# Patient Record
Sex: Female | Born: 1960
Health system: Southern US, Community
[De-identification: ages and names within clinical notes are randomized; demographics above are authoritative.]

## PROBLEM LIST (undated history)

## (undated) DIAGNOSIS — K649 Unspecified hemorrhoids: Secondary | ICD-10-CM

## (undated) DIAGNOSIS — M1990 Unspecified osteoarthritis, unspecified site: Secondary | ICD-10-CM

## (undated) DIAGNOSIS — R911 Solitary pulmonary nodule: Secondary | ICD-10-CM

## (undated) DIAGNOSIS — T7840XA Allergy, unspecified, initial encounter: Secondary | ICD-10-CM

## (undated) DIAGNOSIS — F32A Depression, unspecified: Secondary | ICD-10-CM

## (undated) DIAGNOSIS — L821 Other seborrheic keratosis: Secondary | ICD-10-CM

## (undated) DIAGNOSIS — J45909 Unspecified asthma, uncomplicated: Secondary | ICD-10-CM

## (undated) DIAGNOSIS — K219 Gastro-esophageal reflux disease without esophagitis: Secondary | ICD-10-CM

## (undated) DIAGNOSIS — E7800 Pure hypercholesterolemia, unspecified: Secondary | ICD-10-CM

## (undated) DIAGNOSIS — S060X0A Concussion without loss of consciousness, initial encounter: Secondary | ICD-10-CM

## (undated) DIAGNOSIS — F419 Anxiety disorder, unspecified: Secondary | ICD-10-CM

## (undated) HISTORY — PX: DILATION AND CURETTAGE OF UTERUS: SHX78

## (undated) HISTORY — DX: Gastro-esophageal reflux disease without esophagitis: K21.9

## (undated) HISTORY — PX: SALPINGECTOMY: SHX328

## (undated) HISTORY — DX: Allergy, unspecified, initial encounter: T78.40XA

## (undated) HISTORY — PX: ECTOPIC PREGNANCY SURGERY: SHX613

## (undated) HISTORY — DX: Other seborrheic keratosis: L82.1

## (undated) HISTORY — DX: Pure hypercholesterolemia, unspecified: E78.00

## (undated) HISTORY — DX: Unspecified hemorrhoids: K64.9

## (undated) HISTORY — DX: Solitary pulmonary nodule: R91.1

## (undated) HISTORY — DX: Anxiety disorder, unspecified: F41.9

## (undated) HISTORY — DX: Unspecified asthma, uncomplicated: J45.909

## (undated) HISTORY — PX: COSMETIC SURGERY: SHX468

## (undated) HISTORY — PX: CHOLECYSTECTOMY: SHX55

## (undated) HISTORY — DX: Concussion without loss of consciousness, initial encounter: S06.0X0A

## (undated) HISTORY — DX: Depression, unspecified: F32.A

---
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
## 1981-08-02 HISTORY — PX: WRIST SURGERY: SHX841

## 1986-08-02 HISTORY — PX: ABDOMINAL HYSTERECTOMY: SHX81

## 1988-08-02 HISTORY — PX: RHINOPLASTY: SUR1284

## 1999-06-15 ENCOUNTER — Other Ambulatory Visit: Admission: RE | Admit: 1999-06-15 | Discharge: 1999-06-15 | Payer: Self-pay | Admitting: Gynecology

## 2005-08-02 LAB — HM PAP SMEAR: HM Pap smear: NORMAL

## 2006-01-18 ENCOUNTER — Ambulatory Visit: Payer: Self-pay | Admitting: Family Medicine

## 2006-03-10 ENCOUNTER — Ambulatory Visit: Payer: Self-pay | Admitting: Family Medicine

## 2006-03-14 ENCOUNTER — Ambulatory Visit: Payer: Self-pay | Admitting: Family Medicine

## 2007-06-08 ENCOUNTER — Ambulatory Visit: Payer: Self-pay | Admitting: Family Medicine

## 2007-07-21 ENCOUNTER — Ambulatory Visit: Payer: Self-pay | Admitting: Unknown Physician Specialty

## 2009-11-11 ENCOUNTER — Ambulatory Visit: Payer: Self-pay | Admitting: Family Medicine

## 2010-06-10 ENCOUNTER — Emergency Department: Payer: Self-pay | Admitting: Emergency Medicine

## 2010-06-23 ENCOUNTER — Ambulatory Visit: Payer: Self-pay | Admitting: Specialist

## 2010-09-04 ENCOUNTER — Ambulatory Visit: Payer: Self-pay | Admitting: Surgery

## 2010-09-09 LAB — PATHOLOGY REPORT

## 2010-12-22 ENCOUNTER — Ambulatory Visit: Payer: Self-pay | Admitting: Specialist

## 2011-02-23 ENCOUNTER — Ambulatory Visit: Payer: Self-pay | Admitting: Family Medicine

## 2011-06-16 ENCOUNTER — Ambulatory Visit: Payer: Self-pay | Admitting: Specialist

## 2012-01-03 ENCOUNTER — Emergency Department: Payer: Self-pay | Admitting: Unknown Physician Specialty

## 2012-02-24 ENCOUNTER — Ambulatory Visit: Payer: Self-pay | Admitting: Family Medicine

## 2012-06-15 ENCOUNTER — Ambulatory Visit: Payer: Self-pay | Admitting: Specialist

## 2012-12-18 ENCOUNTER — Ambulatory Visit: Payer: Self-pay | Admitting: Specialist

## 2013-01-18 DIAGNOSIS — Z87820 Personal history of traumatic brain injury: Secondary | ICD-10-CM | POA: Insufficient documentation

## 2013-02-27 ENCOUNTER — Encounter: Payer: Self-pay | Admitting: Neurology

## 2013-02-28 ENCOUNTER — Encounter: Payer: Self-pay | Admitting: Neurology

## 2013-02-28 ENCOUNTER — Ambulatory Visit (INDEPENDENT_AMBULATORY_CARE_PROVIDER_SITE_OTHER): Payer: Federal, State, Local not specified - PPO | Admitting: Neurology

## 2013-02-28 VITALS — BP 100/64 | HR 57 | Ht 62.5 in | Wt 144.0 lb

## 2013-02-28 DIAGNOSIS — S060X0A Concussion without loss of consciousness, initial encounter: Secondary | ICD-10-CM

## 2013-02-28 HISTORY — DX: Concussion without loss of consciousness, initial encounter: S06.0X0A

## 2013-02-28 NOTE — Progress Notes (Signed)
Reason for visit: Concussion  Sara Randolph is a 52 y.o. female  History of present illness:  Sara Randolph is a 52 year old right-handed white female with a history of a fall that occurred on 01/18/2013. The patient was in [place] at that time, and she went into the bathroom, and slipped on tile. The patient struck her face, without definite loss of consciousness. Her husband was present at the time, and he got to her within seconds after the fall, and the patient was conscious, talking to him. The patient however had a period of amnesia that precedes the fall by several minutes, and follows the fall by 6-8 hours. The patient went to the emergency room, and she underwent a CT scan of the brain that was unremarkable. The patient did damage some of her front teeth. The patient did have a headache for several days afterwards, and she had some neck stiffness. The patient however, has had a full recovery. The patient denies any residual confusion, memory problems, headache, neck stiffness, gait disorder, or numbness or weakness of the face, arms, or legs. The patient denies problems controlling the bowels or the bladder. The patient has not had any further blackout episodes or episodes of "lost time". The patient did have an event where she went to a doctor's office on the wrong day for an appointment. This has not recurred. The patient is back to functioning fully at this time, driving a car, paying her bills, cooking, etc. without problems. The patient returns to this office for an evaluation.  Past Medical History  Diagnosis Date  . Asthma   . Hemorrhoids   . High cholesterol   . Concussion with no loss of consciousness 02/28/2013    Past Surgical History  Procedure Laterality Date  . Tonsillectomy  1969  . Wrist surgery  1983  . Abdominal hysterectomy  1988  . Rhinoplasty  1990  . Cholecystectomy    . Ectopic pregnancy surgery      Family History  Problem Relation Age of Onset  .  Alzheimer's disease Mother   . Parkinsonism Father   . Heart disease Brother     Social history:  reports that she has never smoked. She does not have any smokeless tobacco history on file. She reports that  drinks alcohol. She reports that she does not use illicit drugs.  Medications:  Current Outpatient Prescriptions on File Prior to Visit  Medication Sig Dispense Refill  . cetirizine (ZYRTEC) 10 MG tablet Take 10 mg by mouth daily.      . valACYclovir (VALTREX) 1000 MG tablet Take 1,000 mg by mouth as needed.       No current facility-administered medications on file prior to visit.    Allergies:  Allergies  Allergen Reactions  . Codeine   . Hydrocodone Nausea Only    ROS:  Out of a complete 14 system review of symptoms, the patient complains only of the following symptoms, and all other reviewed systems are negative.  Transient headache, confusion following fall  Amnesia for the fall event  Blood pressure 100/64, pulse 57, height 5' 2.5" (1.588 m), weight 144 lb (65.318 kg).  Physical Exam  General: The patient is alert and cooperative at the time of the examination.  Head: Pupils are equal, round, and reactive to light. Discs are flat bilaterally.  Neck: The neck is supple, no carotid bruits are noted.  Respiratory: The respiratory examination is clear.  Cardiovascular: The cardiovascular examination reveals a regular rate and  rhythm, no obvious murmurs or rubs are noted.  Skin: Extremities are without significant edema.  Neurologic Exam  Mental status:  Cranial nerves: Facial symmetry is present. There is good sensation of the face to pinprick and soft touch bilaterally. The strength of the facial muscles and the muscles to head turning and shoulder shrug are normal bilaterally. Speech is well enunciated, no aphasia or dysarthria is noted. Extraocular movements are full. Visual fields are full.  Motor: The motor testing reveals 5 over 5 strength of all 4  extremities. Good symmetric motor tone is noted throughout.  Sensory: Sensory testing is intact to pinprick, soft touch, vibration sensation, and position sense on all 4 extremities. No evidence of extinction is noted.  Coordination: Cerebellar testing reveals good finger-nose-finger and heel-to-shin bilaterally.  Gait and station: Gait is normal. Tandem gait is normal. Romberg is negative. No drift is seen.  Reflexes: Deep tendon reflexes are symmetric and normal bilaterally. Toes are downgoing bilaterally.   Assessment/Plan:  1. Concussion  The patient sustained a concussion, but she has recovered quite rapidly. The clinical examination today is normal. The patient did not lose consciousness, and she did not have a seizure around the time of the concussion. The patient does have antegrade amnesia and some period of amnesia following the fall. The patient has no residual symptoms. I would not restrict any of her activities at this point. No further workup is indicated, but if the patient indicates that she has had a change in her clinical condition, she is to call our office for an evaluation. The patient will followup if needed.  Marlan Palau MD 02/28/2013 9:11 AM  [facility] [address] [place], [place] [address]  Phone [phone] Fax [phone]

## 2013-06-04 ENCOUNTER — Ambulatory Visit: Payer: Self-pay | Admitting: Family Medicine

## 2013-10-05 ENCOUNTER — Ambulatory Visit: Payer: Self-pay | Admitting: Family Medicine

## 2013-10-08 ENCOUNTER — Ambulatory Visit: Payer: Self-pay | Admitting: Unknown Physician Specialty

## 2013-10-08 LAB — HM COLONOSCOPY: HM Colonoscopy: NORMAL

## 2014-01-07 ENCOUNTER — Ambulatory Visit: Payer: Self-pay | Admitting: Specialist

## 2014-06-17 ENCOUNTER — Ambulatory Visit: Payer: Self-pay | Admitting: Family Medicine

## 2014-06-18 LAB — HM MAMMOGRAPHY: HM Mammogram: NORMAL

## 2014-10-03 ENCOUNTER — Ambulatory Visit: Payer: Self-pay | Admitting: Family Medicine

## 2015-01-13 ENCOUNTER — Encounter: Payer: Self-pay | Admitting: Family Medicine

## 2015-01-13 ENCOUNTER — Ambulatory Visit (INDEPENDENT_AMBULATORY_CARE_PROVIDER_SITE_OTHER): Payer: Federal, State, Local not specified - PPO | Admitting: Family Medicine

## 2015-01-13 ENCOUNTER — Encounter (INDEPENDENT_AMBULATORY_CARE_PROVIDER_SITE_OTHER): Payer: Self-pay

## 2015-01-13 VITALS — BP 124/84 | HR 77 | Temp 98.2°F | Resp 14 | Ht 62.0 in | Wt 158.2 lb

## 2015-01-13 DIAGNOSIS — K649 Unspecified hemorrhoids: Secondary | ICD-10-CM | POA: Insufficient documentation

## 2015-01-13 DIAGNOSIS — J3089 Other allergic rhinitis: Secondary | ICD-10-CM | POA: Insufficient documentation

## 2015-01-13 DIAGNOSIS — Z79899 Other long term (current) drug therapy: Secondary | ICD-10-CM | POA: Diagnosis not present

## 2015-01-13 DIAGNOSIS — J309 Allergic rhinitis, unspecified: Secondary | ICD-10-CM | POA: Diagnosis not present

## 2015-01-13 DIAGNOSIS — N951 Menopausal and female climacteric states: Secondary | ICD-10-CM

## 2015-01-13 DIAGNOSIS — B001 Herpesviral vesicular dermatitis: Secondary | ICD-10-CM | POA: Insufficient documentation

## 2015-01-13 DIAGNOSIS — H81399 Other peripheral vertigo, unspecified ear: Secondary | ICD-10-CM | POA: Insufficient documentation

## 2015-01-13 DIAGNOSIS — K219 Gastro-esophageal reflux disease without esophagitis: Secondary | ICD-10-CM | POA: Diagnosis not present

## 2015-01-13 DIAGNOSIS — E785 Hyperlipidemia, unspecified: Secondary | ICD-10-CM | POA: Diagnosis not present

## 2015-01-13 MED ORDER — MOMETASONE FUROATE 50 MCG/ACT NA SUSP: 2.0000 | Freq: Every day | NASAL | Status: DC

## 2015-01-13 MED ORDER — ATORVASTATIN CALCIUM 20 MG PO TABS: 20.0000 mg | ORAL_TABLET | Freq: Every day | ORAL | Status: DC

## 2015-01-13 MED ORDER — OMEPRAZOLE 20 MG PO CPDR: 20.0000 mg | DELAYED_RELEASE_CAPSULE | Freq: Every day | ORAL | Status: DC

## 2015-01-13 MED ORDER — VALACYCLOVIR HCL 1 G PO TABS: 1000.0000 mg | ORAL_TABLET | ORAL | Status: DC | PRN

## 2015-01-13 MED ORDER — VENLAFAXINE HCL 75 MG PO TABS: 75.0000 mg | ORAL_TABLET | Freq: Every day | ORAL | Status: DC

## 2015-01-13 NOTE — Progress Notes (Signed)
Name: Sara Randolph   MRN: [id number]    DOB: Sep 27, 1960   Date:01/13/2015       Progress Note  Subjective  Chief Complaint  Chief Complaint  Patient presents with  . Medication Refill  . Allergic Rhinitis   . Gastrophageal Reflux  . Hyperlipidemia    HPI  MENOPAUSAL SYMPTOMS:  She only has hot flashes, but improved with Effexor, only triggered by alcohol intake or when she eats more sweets.  No side effects of medication.   GERD: usually under control, but she has changed her diet recently, increasing fruit and vegetables, and she  noticed a little increase of symptoms, but is back on daily  Omeprazole and has not symptoms now.  HYPERLIPIDEMIA: taking Crestor, could not tolerate pravastatin because it caused pruritus.  She states Crestor is too expensive, we will try Atorvastatin now  AR: she would like to change from Flonase to Nasacort because of nose bleeds and headaches, however not covered under her plan, so we will change to Nasonex and she will let us know if it works. Otherwise are controlled with Zyrtec and Nasal steroid daily    Patient Active Problem List   Diagnosis Date Noted  . Peripheral vertigo 01/13/2015  . Dyslipidemia 01/13/2015  . Cold sore 01/13/2015  . Gastric reflux 01/13/2015  . Allergic rhinitis 01/13/2015  . History of concussion 01/18/2013    Past Surgical History  Procedure Laterality Date  . Tonsillectomy  1969  . Wrist surgery  1983  . Abdominal hysterectomy  1988  . Rhinoplasty  1990  . Cholecystectomy    . Ectopic pregnancy surgery    . Dilation and curettage of uterus    . Salpingectomy      Family History  Problem Relation Age of Onset  . Alzheimer's disease Mother   . Parkinsonism Father   . Heart disease Brother   . Heart attack Brother   . Depression Sister   . Osteoporosis Sister     History   Social History  . Marital Status: Married    Spouse Name: Buddy  . Number of Children: 2  . Years of Education: N/A    Occupational History  . Postmaster    Social History Main Topics  . Smoking status: Never Smoker   . Smokeless tobacco: Never Used  . Alcohol Use: 0.0 oz/week    0 Standard drinks or equivalent per week     Comment: Consumes alcohol on Saturday  . Drug Use: No  . Sexual Activity: Yes   Other Topics Concern  . Not on file   Social History Narrative     Current outpatient prescriptions:  .  acetaminophen (TYLENOL) 500 MG tablet, Take 1 tablet by mouth daily., Disp: , Rfl:  .  aspirin 81 MG tablet, Take 1 tablet by mouth daily., Disp: , Rfl:  .  cetirizine (ZYRTEC) 10 MG tablet, Take 10 mg by mouth daily., Disp: , Rfl:  .  Coenzyme Q-10 100 MG capsule, Take 1 capsule by mouth daily., Disp: , Rfl:  .  fluticasone (FLONASE) 50 MCG/ACT nasal spray, Place 2 sprays into both nostrils daily., Disp: , Rfl:  .  omeprazole (PRILOSEC) 20 MG capsule, Take 1 capsule by mouth daily., Disp: , Rfl:  .  rosuvastatin (CRESTOR) 5 MG tablet, Take 1 tablet by mouth daily., Disp: , Rfl:  .  valACYclovir (VALTREX) 1000 MG tablet, Take 1,000 mg by mouth as needed., Disp: , Rfl:  .  venlafaxine (EFFEXOR) 75 MG  tablet, Take 1 tablet by mouth daily., Disp: , Rfl:   Allergies  Allergen Reactions  . Pravastatin Itching    dizziness  . Codeine   . Hydrocodone Nausea Only     ROS  Constitutional: Negative for fever or weight change.  Respiratory: Negative for cough and shortness of breath.   Cardiovascular: Negative for chest pain or palpitations.  Gastrointestinal: Negative for abdominal pain, no bowel changes.  Musculoskeletal: Negative for gait problem or joint swelling.  Skin: Negative for rash.  Neurological: Negative for dizziness. Some headaches No other specific complaints in a complete review of systems (except as listed in HPI above).  Objective  Filed Vitals:   01/13/15 1644  BP: 124/84  Pulse: 77  Temp: 98.2 F (36.8 C)  TempSrc: Oral  Resp: 14  Height: 5\' 2"  (1.575 m)   Weight: 158 lb 3.2 oz (71.759 kg)  SpO2: 97%    Body mass index is 28.93 kg/(m^2).  Physical Exam  Constitutional: Patient appears well-developed and well-nourished. No distress.  HENT: Head: Normocephalic and atraumatic. Ears: B TMs ok, no erythema or effusion; Nose: Nose normal. Mouth/Throat: Oropharynx is clear and moist. No oropharyngeal exudate.  Eyes: Conjunctivae and EOM are normal. Pupils are equal, round, and reactive to light. No scleral icterus.  Neck: Normal range of motion. Neck supple. No JVD present. No thyromegaly present.  Cardiovascular: Normal rate, regular rhythm and normal heart sounds.  No murmur heard. No BLE edema. Pulmonary/Chest: Effort normal and breath sounds normal. No respiratory distress. Abdominal: Soft. Bowel sounds are normal, no distension. There is no tenderness. no masses Musculoskeletal: Normal range of motion, no joint effusions. No gross deformities Neurological: he is alert and oriented to person, place, and time. No cranial nerve deficit. Coordination, balance, strength, speech and gait are normal.  Skin: Skin is warm and dry. No rash noted. No erythema.  Psychiatric: Patient has a normal mood and affect. behavior is normal. Judgment and thought content normal.      PHQ2/9: Depression screen PHQ 2/9 01/13/2015  Decreased Interest 0  Down, Depressed, Hopeless 0  PHQ - 2 Score 0    Fall Risk: Fall Risk  01/13/2015  Falls in the past year? No     Assessment & Plan   1. Menopausal symptom  - venlafaxine (EFFEXOR) 75 MG tablet; Take 1 tablet (75 mg total) by mouth daily.  Dispense: 90 tablet; Refill: 1  2. Gastric reflux  - omeprazole (PRILOSEC) 20 MG capsule; Take 1 capsule (20 mg total) by mouth daily.  Dispense: 90 capsule; Refill: 1  3. Dyslipidemia  - Lipid Profile - atorvastatin (LIPITOR) 20 MG tablet; Take 1 tablet (20 mg total) by mouth daily.  Dispense: 90 tablet; Refill: 1  4. Perennial allergic rhinitis  -  Comprehensive Metabolic Panel (CMET) - mometasone (NASONEX) 50 MCG/ACT nasal spray; Place 2 sprays into the nose daily.  Dispense: 17 g; Refill: 5  5. Long-term use of high-risk medication Check labs

## 2015-04-02 ENCOUNTER — Other Ambulatory Visit: Payer: Self-pay | Admitting: Family Medicine

## 2015-04-02 DIAGNOSIS — R739 Hyperglycemia, unspecified: Secondary | ICD-10-CM

## 2015-04-02 LAB — LIPID PANEL
Chol/HDL Ratio: 3.1 ratio units (ref 0.0–4.4)
Cholesterol, Total: 171 mg/dL (ref 100–199)
HDL: 56 mg/dL (ref >39)
LDL Calculated: 92 mg/dL (ref 0–99)
Triglycerides: 117 mg/dL (ref 0–149)
VLDL Cholesterol Cal: 23 mg/dL (ref 5–40)

## 2015-04-02 LAB — COMPREHENSIVE METABOLIC PANEL
ALT: 11 IU/L (ref 0–32)
AST: 12 IU/L (ref 0–40)
Albumin/Globulin Ratio: 1.8 (ref 1.1–2.5)
Albumin: 4.5 g/dL (ref 3.5–5.5)
Alkaline Phosphatase: 64 IU/L (ref 39–117)
BUN/Creatinine Ratio: 28 — ABNORMAL HIGH (ref 9–23)
BUN: 19 mg/dL (ref 6–24)
Bilirubin Total: 0.5 mg/dL (ref 0.0–1.2)
CO2: 24 mmol/L (ref 18–29)
Calcium: 9.6 mg/dL (ref 8.7–10.2)
Chloride: 98 mmol/L (ref 97–108)
Creatinine, Ser: 0.69 mg/dL (ref 0.57–1.00)
GFR calc Af Amer: 113 mL/min/{1.73_m2} (ref >59)
GFR calc non Af Amer: 98 mL/min/{1.73_m2} (ref >59)
Globulin, Total: 2.5 g/dL (ref 1.5–4.5)
Glucose: 100 mg/dL — ABNORMAL HIGH (ref 65–99)
Potassium: 4.8 mmol/L (ref 3.5–5.2)
Sodium: 139 mmol/L (ref 134–144)
Total Protein: 7 g/dL (ref 6.0–8.5)

## 2015-04-02 NOTE — Progress Notes (Signed)
Pt notified A1C added

## 2015-05-11 ENCOUNTER — Other Ambulatory Visit: Payer: Self-pay | Admitting: Family Medicine

## 2015-06-29 ENCOUNTER — Telehealth: Payer: Self-pay | Admitting: Physician Assistant

## 2015-06-29 DIAGNOSIS — B001 Herpesviral vesicular dermatitis: Secondary | ICD-10-CM

## 2015-06-29 MED ORDER — ACYCLOVIR 400 MG PO TABS: 800.0000 mg | ORAL_TABLET | Freq: Two times a day (BID) | ORAL | Status: DC

## 2015-06-29 NOTE — Progress Notes (Signed)
We are sorry that you are not feeling well.  Here is how we plan to help!  Based on what you have shared with me it does look like you have a viral infection.    Most cold sores or fever blisters are small fluid filled blisters around the mouth caused by herpes simplex virus.  The most common strain of the virus causing cold sores is herpes simplex virus 1.  It can be spread by skin contact, sharing eating utensils, or even sharing towels.  Cold sores are contagious to other people until dry. (Approximately 5-7 days).  Wash your hands. You can spread the virus to your eyes through handling your contact lenses after touching the lesions.  Most people experience pain at the sight or tingling sensations in their lips that may begin before the ulcers erupt.  Herpes simplex is treatable but not curable.  It may lie dormant for a long time and then reappear due to stress or prolonged sun exposure.  Many patients have success in treating their cold sores with an over the counter topical called Abreva.  You may apply the cream up to 5 times daily (maximum 10 days) until healing occurs.  If you would like to use an oral antiviral medication to speed the healing of your cold sore, I have sent a prescription to your local pharmacy Acyclovir 800 mg twice a day for 7 days. For sinus symptoms, increase fluids. Take Mucinex (plain if history of high blood pressure), Mucinex-DM if there is a significant cough. Place a humidifier in the bedroom. If symptoms are not resolving over the next 3-4 days, let us know.  HOME CARE:   Wash your hands frequently.  Do not pick at or rub the sore.  Don't open the blisters.  Avoid kissing other people during this time.  Avoid sharing drinking glasses, eating utensils, or razors.  Do not handle contact lenses unless you have thoroughly washed your hands with soap and warm water!  Avoid oral sex during this time.  Herpes from sores on your mouth can spread to your  partner's genital area.  Avoid contact with anyone who has eczema or a weakened immune system.  Cold sores are often triggered by exposure to intense sunlight, use a lip balm containing a sunscreen (SPF 30 or higher).  GET HELP RIGHT AWAY IF:   Blisters look infected.  Blisters occur near or in the eye.  Symptoms last longer than 10 days.  Your symptoms become worse.  MAKE SURE YOU:   Understand these instructions.  Will watch your condition.  Will get help right away if you are not doing well or get worse.    Your e-visit answers were reviewed by a board certified advanced clinical practitioner to complete your personal care plan.  Depending upon the condition, your plan could have  Included both over the counter or prescription medications.    Please review your pharmacy choice.  Be sure that the pharmacy you have chosen is open so that you can pick up your prescription now.  If there is a problem you csn message your provider in [place] to have the prescription routed to another pharmacy.    Your safety is important to [place].  If you have drug allergies check our prescription carefully.  For the next 24 hours you can use MyChart to ask questions about today's visit, request a non-urgent call back, or ask for a work or school excuse from your e-visit provider.  You will  get an email in the next two days asking about your experience.  I hope that your e-visit has been valuable and will speed your recovery.

## 2015-07-14 ENCOUNTER — Telehealth: Payer: Self-pay | Admitting: Family Medicine

## 2015-07-14 NOTE — Telephone Encounter (Signed)
Patient next appointment is 08-07-15 for CPE. Patient has lost her venlafaxine, it was in her purse and when she dropped her purse everything fell out and she has not seen it since. She is requesting a refill today to be sent to [facility].

## 2015-07-15 ENCOUNTER — Other Ambulatory Visit: Payer: Self-pay | Admitting: Family Medicine

## 2015-07-15 MED ORDER — VENLAFAXINE HCL ER 75 MG PO CP24: 75.0000 mg | ORAL_CAPSULE | Freq: Every day | ORAL | Status: DC

## 2015-07-15 NOTE — Telephone Encounter (Signed)
done

## 2015-08-07 ENCOUNTER — Ambulatory Visit (INDEPENDENT_AMBULATORY_CARE_PROVIDER_SITE_OTHER): Payer: Federal, State, Local not specified - PPO | Admitting: Family Medicine

## 2015-08-07 ENCOUNTER — Encounter: Payer: Self-pay | Admitting: Family Medicine

## 2015-08-07 VITALS — BP 108/70 | HR 96 | Temp 98.1°F | Resp 16 | Ht 62.0 in | Wt 159.2 lb

## 2015-08-07 DIAGNOSIS — R739 Hyperglycemia, unspecified: Secondary | ICD-10-CM

## 2015-08-07 DIAGNOSIS — Z1239 Encounter for other screening for malignant neoplasm of breast: Secondary | ICD-10-CM

## 2015-08-07 DIAGNOSIS — Z0000 Encounter for general adult medical examination without abnormal findings: Secondary | ICD-10-CM | POA: Diagnosis not present

## 2015-08-07 DIAGNOSIS — Z01419 Encounter for gynecological examination (general) (routine) without abnormal findings: Secondary | ICD-10-CM

## 2015-08-07 LAB — POCT GLYCOSYLATED HEMOGLOBIN (HGB A1C): Hemoglobin A1C: 5.7

## 2015-08-07 NOTE — Patient Instructions (Signed)
Discussed importance of 150 minutes of physical activity weekly, eat two servings of fish weekly, eat one serving of tree nuts ( cashews, pistachios, pecans, almonds..) every other day, eat 6 servings of fruit/vegetables daily and drink plenty of water and avoid sweet beverages. 

## 2015-08-07 NOTE — Progress Notes (Signed)
Name: Sara Randolph   MRN: [id number]    DOB: Aug 26, 1960   Date:08/07/2015       Progress Note  Subjective  Chief Complaint  Chief Complaint  Patient presents with  . Annual Exam    HPI  Well Woman Exam: she is s/p hysterectomy for benign disease - enlarged uterus. She denies dyspareunia, no vaginal dryness. Hot flashes under control with Effexor ( at most one night sweat now per night )and tolerating medication well.    Patient Active Problem List   Diagnosis Date Noted  . Peripheral vertigo 01/13/2015  . Dyslipidemia 01/13/2015  . Cold sore 01/13/2015  . Gastric reflux 01/13/2015  . Perennial allergic rhinitis 01/13/2015  . Menopausal symptom 01/13/2015  . History of concussion 01/18/2013    Past Surgical History  Procedure Laterality Date  . Tonsillectomy  1969  . Wrist surgery  1983  . Abdominal hysterectomy  1988  . Rhinoplasty  1990  . Cholecystectomy    . Ectopic pregnancy surgery    . Dilation and curettage of uterus    . Salpingectomy      Family History  Problem Relation Age of Onset  . Alzheimer's disease Mother   . Parkinsonism Father   . Heart disease Brother   . Heart attack Brother   . Depression Sister   . Osteoporosis Sister     Social History   Social History  . Marital Status: Married    Spouse Name: Buddy  . Number of Children: 2  . Years of Education: N/A   Occupational History  . Postmaster    Social History Main Topics  . Smoking status: Never Smoker   . Smokeless tobacco: Never Used  . Alcohol Use: 0.0 oz/week    0 Standard drinks or equivalent per week     Comment: Consumes alcohol on Saturday  . Drug Use: No  . Sexual Activity:    Partners: Male   Other Topics Concern  . Not on file   Social History Narrative     Current outpatient prescriptions:  .  acetaminophen (TYLENOL) 500 MG tablet, Take 1 tablet by mouth daily., Disp: , Rfl:  .  acyclovir (ZOVIRAX) 400 MG tablet, Take 2 tablets (800 mg total) by mouth  2 (two) times daily., Disp: 28 tablet, Rfl: 0 .  aspirin 81 MG tablet, Take 1 tablet by mouth daily., Disp: , Rfl:  .  atorvastatin (LIPITOR) 20 MG tablet, Take 1 tablet (20 mg total) by mouth daily., Disp: 90 tablet, Rfl: 1 .  cetirizine (ZYRTEC) 10 MG tablet, Take 10 mg by mouth daily., Disp: , Rfl:  .  Coenzyme Q-10 100 MG capsule, Take 1 capsule by mouth daily., Disp: , Rfl:  .  mometasone (NASONEX) 50 MCG/ACT nasal spray, Place 2 sprays into the nose daily., Disp: 17 g, Rfl: 5 .  omeprazole (PRILOSEC) 20 MG capsule, Take 1 capsule (20 mg total) by mouth daily., Disp: 90 capsule, Rfl: 1 .  valACYclovir (VALTREX) 1000 MG tablet, , Disp: , Rfl:  .  venlafaxine XR (EFFEXOR XR) 75 MG 24 hr capsule, Take 1 capsule (75 mg total) by mouth daily with breakfast., Disp: 90 capsule, Rfl: 0  Allergies  Allergen Reactions  . Pravastatin Itching    dizziness  . Hydrocodone Nausea Only     ROS  Constitutional: Negative for fever and mild weight change.  Respiratory: Negative for cough and shortness of breath.   Cardiovascular: Negative for chest pain or palpitations.  Gastrointestinal: Negative  for abdominal pain, no bowel changes.  Musculoskeletal: Negative for gait problem or joint swelling.  Skin: Negative for rash.  Neurological: Negative for dizziness or headache.  No other specific complaints in a complete review of systems (except as listed in HPI above).  Objective  Filed Vitals:   08/07/15 0833  BP: 108/70  Pulse: 96  Temp: 98.1 F (36.7 C)  TempSrc: Oral  Resp: 16  Height: 5\' 2"  (1.575 m)  Weight: 159 lb 3.2 oz (72.213 kg)  SpO2: 95%    Body mass index is 29.11 kg/(m^2).  Physical Exam  Constitutional: Patient appears well-developed and well-nourished. No distress.  HENT: Head: Normocephalic and atraumatic. Ears: B TMs ok, no erythema or effusion; Nose: Nose normal. Mouth/Throat: Oropharynx is clear and moist. No oropharyngeal exudate.  Eyes: Conjunctivae and EOM are  normal. Pupils are equal, round, and reactive to light. No scleral icterus.  Neck: Normal range of motion. Neck supple. No JVD present. No thyromegaly present.  Cardiovascular: Normal rate, regular rhythm and normal heart sounds.  No murmur heard. No BLE edema. Pulmonary/Chest: Effort normal and breath sounds normal. No respiratory distress. Abdominal: Soft. Bowel sounds are normal, no distension. There is no tenderness. no masses Breast: no lumps or masses, no nipple discharge or rashes FEMALE GENITALIA:  External genitalia normal External urethra normal RECTAL: not done  Musculoskeletal: Normal range of motion, no joint effusions. No gross deformities Neurological: he is alert and oriented to person, place, and time. No cranial nerve deficit. Coordination, balance, strength, speech and gait are normal.  Skin: Skin is warm and dry. No rash noted. No erythema.  Psychiatric: Patient has a normal mood and affect. behavior is normal. Judgment and thought content normal.   PHQ2/9: Depression screen [facility] 2/9 08/07/2015 01/13/2015  Decreased Interest 0 0  Down, Depressed, Hopeless 0 0  PHQ - 2 Score 0 0     Fall Risk: Fall Risk  08/07/2015 01/13/2015  Falls in the past year? No No     Functional Status Survey: Is the patient deaf or have difficulty hearing?: No Does the patient have difficulty seeing, even when wearing glasses/contacts?: Yes (glasses) Does the patient have difficulty concentrating, remembering, or making decisions?: No Does the patient have difficulty walking or climbing stairs?: No Does the patient have difficulty dressing or bathing?: No Does the patient have difficulty doing errands alone such as visiting a doctor's office or shopping?: No    Assessment & Plan  1. Well woman exam  Discussed importance of 150 minutes of physical activity weekly, eat two servings of fish weekly, eat one serving of tree nuts ( cashews, pistachios, pecans, almonds.Marland Kitchen) every other day,  eat 6 servings of fruit/vegetables daily and drink plenty of water and avoid sweet beverages.   2. Hyperglycemia  - POCT HgB A1C  3. Breast cancer screening  - MM Digital Screening; Future

## 2015-09-06 ENCOUNTER — Other Ambulatory Visit: Payer: Self-pay | Admitting: Family Medicine

## 2015-09-26 ENCOUNTER — Other Ambulatory Visit: Payer: Self-pay | Admitting: Family Medicine

## 2015-09-26 NOTE — Telephone Encounter (Signed)
Patient requesting refill. 

## 2015-10-03 ENCOUNTER — Other Ambulatory Visit: Payer: Self-pay | Admitting: Family Medicine

## 2015-10-03 ENCOUNTER — Encounter: Payer: Self-pay | Admitting: Family Medicine

## 2015-10-03 MED ORDER — OSELTAMIVIR PHOSPHATE 75 MG PO CAPS
75.0000 mg | ORAL_CAPSULE | Freq: Every day | ORAL | Status: DC
Start: 2015-10-03 — End: 2015-11-06

## 2015-10-03 NOTE — Progress Notes (Signed)
Sending medication to take one daily for prophylaxis

## 2015-11-06 ENCOUNTER — Ambulatory Visit (INDEPENDENT_AMBULATORY_CARE_PROVIDER_SITE_OTHER): Payer: Federal, State, Local not specified - PPO | Admitting: Family Medicine

## 2015-11-06 ENCOUNTER — Encounter: Payer: Self-pay | Admitting: Family Medicine

## 2015-11-06 VITALS — BP 104/68 | HR 90 | Temp 97.6°F | Resp 18 | Ht 62.0 in | Wt 165.8 lb

## 2015-11-06 DIAGNOSIS — N951 Menopausal and female climacteric states: Secondary | ICD-10-CM

## 2015-11-06 DIAGNOSIS — J309 Allergic rhinitis, unspecified: Secondary | ICD-10-CM

## 2015-11-06 DIAGNOSIS — J3089 Other allergic rhinitis: Secondary | ICD-10-CM

## 2015-11-06 DIAGNOSIS — E785 Hyperlipidemia, unspecified: Secondary | ICD-10-CM | POA: Diagnosis not present

## 2015-11-06 DIAGNOSIS — K219 Gastro-esophageal reflux disease without esophagitis: Secondary | ICD-10-CM

## 2015-11-06 DIAGNOSIS — B001 Herpesviral vesicular dermatitis: Secondary | ICD-10-CM

## 2015-11-06 DIAGNOSIS — R739 Hyperglycemia, unspecified: Secondary | ICD-10-CM

## 2015-11-06 MED ORDER — ATORVASTATIN CALCIUM 20 MG PO TABS: 20.0000 mg | ORAL_TABLET | Freq: Every day | ORAL | Status: DC

## 2015-11-06 MED ORDER — OMEPRAZOLE 20 MG PO CPDR: 20.0000 mg | DELAYED_RELEASE_CAPSULE | Freq: Every day | ORAL | Status: DC

## 2015-11-06 MED ORDER — VALACYCLOVIR HCL 1 G PO TABS: 1000.0000 mg | ORAL_TABLET | Freq: Two times a day (BID) | ORAL | Status: DC

## 2015-11-06 MED ORDER — MOMETASONE FUROATE 50 MCG/ACT NA SUSP: 2.0000 | Freq: Every day | NASAL | Status: DC

## 2015-11-06 MED ORDER — VENLAFAXINE HCL 75 MG PO TABS: 75.0000 mg | ORAL_TABLET | Freq: Every day | ORAL | Status: DC

## 2015-11-06 NOTE — Progress Notes (Signed)
Name: Sara Randolph   MRN: [id number]    DOB: 1960/11/27   Date:11/06/2015       Progress Note  Subjective  Chief Complaint  Chief Complaint  Patient presents with  . Follow-up    patient is here for her 71-month f/u  . Medication Refill    patient needs a refill and wants to switch from the Venlafaxine ER to the regular one since it makes her drowsy all day    HPI  MENOPAUSAL SYMPTOMS: She only has hot flashes, but improved with Effexor, only triggered by alcohol intake or when she eats more sweets. She states that Effexor ER caused drowsiness but is doing well on plain Effexor. Controlling her symptoms   GERD: usually under control, but she has changed her diet recently, increasing fruit and vegetables, and she noticed a little increase of symptoms, but is back on daily Omeprazole and has not symptoms now.  HYPERLIPIDEMIA: she is on Atorvastatin and is doing well   AR: she states she has been having more nasal congestion, rhinorrhea and occasional sneezing and would like to have refills of Nasonex. No cough at this time  FEVER BLISTER: intermittent symptoms, needs refills of Valtrex, triggered by stress and sun exposure  METABOLIC SYNDROME: last Q000111Q 5.7% she denies polyphagia, polydipsia or polyuria   Patient Active Problem List   Diagnosis Date Noted  . Peripheral vertigo 01/13/2015  . Dyslipidemia 01/13/2015  . Cold sore 01/13/2015  . Gastric reflux 01/13/2015  . Perennial allergic rhinitis 01/13/2015  . Menopausal symptom 01/13/2015  . History of concussion 01/18/2013    Past Surgical History  Procedure Laterality Date  . Tonsillectomy  1969  . Wrist surgery  1983  . Abdominal hysterectomy  1988  . Rhinoplasty  1990  . Cholecystectomy    . Ectopic pregnancy surgery    . Dilation and curettage of uterus    . Salpingectomy      Family History  Problem Relation Age of Onset  . Alzheimer's disease Mother   . Parkinsonism Father   . Heart disease  Brother   . Heart attack Brother   . Depression Sister   . Osteoporosis Sister     Social History   Social History  . Marital Status: Married    Spouse Name: Buddy  . Number of Children: 2  . Years of Education: N/A   Occupational History  . Postmaster    Social History Main Topics  . Smoking status: Never Smoker   . Smokeless tobacco: Never Used  . Alcohol Use: 0.0 oz/week    0 Standard drinks or equivalent per week     Comment: Consumes alcohol on Saturday  . Drug Use: No  . Sexual Activity:    Partners: Male   Other Topics Concern  . Not on file   Social History Narrative     Current outpatient prescriptions:  .  acetaminophen (TYLENOL) 500 MG tablet, Take 1 tablet by mouth daily., Disp: , Rfl:  .  aspirin 81 MG tablet, Take 1 tablet by mouth daily., Disp: , Rfl:  .  atorvastatin (LIPITOR) 20 MG tablet, TAKE ONE TABLET BY MOUTH ONCE DAILY, Disp: 90 tablet, Rfl: 0 .  cetirizine (ZYRTEC) 10 MG tablet, Take 10 mg by mouth daily., Disp: , Rfl:  .  Coenzyme Q-10 100 MG capsule, Take 1 capsule by mouth daily., Disp: , Rfl:  .  mometasone (NASONEX) 50 MCG/ACT nasal spray, Place 2 sprays into the nose daily., Disp: 17 g,  Rfl: 5 .  omeprazole (PRILOSEC) 20 MG capsule, TAKE ONE CAPSULE BY MOUTH ONCE DAILY, Disp: 90 capsule, Rfl: 0 .  valACYclovir (VALTREX) 1000 MG tablet, , Disp: , Rfl:  .  venlafaxine (EFFEXOR) 75 MG tablet, TAKE ONE TABLET BY MOUTH ONCE DAILY, Disp: 90 tablet, Rfl: 0  Allergies  Allergen Reactions  . Pravastatin Itching    dizziness  . Hydrocodone Nausea Only     ROS  Constitutional: Negative for fever or weight change.  Respiratory: Negative for cough and shortness of breath.   Cardiovascular: Negative for chest pain or palpitations.  Gastrointestinal: Negative for abdominal pain, no bowel changes.  Musculoskeletal: Negative for gait problem or joint swelling.  Skin: Negative for rash.  Neurological: Negative for dizziness or headache.  No  other specific complaints in a complete review of systems (except as listed in HPI above).  Objective  Filed Vitals:   11/06/15 0848  BP: 104/68  Pulse: 125  Temp: 97.6 F (36.4 C)  TempSrc: Oral  Resp: 18  Height: 5\' 2"  (1.575 m)  Weight: 165 lb 12.8 oz (75.206 kg)  SpO2: 97%    Body mass index is 30.32 kg/(m^2).  Physical Exam  Constitutional: Patient appears well-developed and well-nourished. Obese  No distress.  HEENT: head atraumatic, normocephalic, pupils equal and reactive to light,  neck supple, throat within normal limits Cardiovascular: Normal rate, regular rhythm and normal heart sounds.  No murmur heard. No BLE edema. Pulmonary/Chest: Effort normal and breath sounds normal. No respiratory distress. Abdominal: Soft.  There is no tenderness. Psychiatric: Patient has a normal mood and affect. behavior is normal. Judgment and thought content normal.   PHQ2/9: Depression screen [facility] 2/9 11/06/2015 08/07/2015 01/13/2015  Decreased Interest 0 0 0  Down, Depressed, Hopeless 0 0 0  PHQ - 2 Score 0 0 0     Fall Risk: Fall Risk  11/06/2015 08/07/2015 01/13/2015  Falls in the past year? No No No     Functional Status Survey: Is the patient deaf or have difficulty hearing?: No Does the patient have difficulty seeing, even when wearing glasses/contacts?: No Does the patient have difficulty concentrating, remembering, or making decisions?: No Does the patient have difficulty walking or climbing stairs?: No Does the patient have difficulty dressing or bathing?: No Does the patient have difficulty doing errands alone such as visiting a doctor's office or shopping?: No    Assessment & Plan   1. Perennial allergic rhinitis  - mometasone (NASONEX) 50 MCG/ACT nasal spray; Place 2 sprays into the nose daily.  Dispense: 51 g; Refill: 1  2. Dyslipidemia  - atorvastatin (LIPITOR) 20 MG tablet; Take 1 tablet (20 mg total) by mouth daily.  Dispense: 90 tablet; Refill: 1  3.  Menopausal symptom  - venlafaxine (EFFEXOR) 75 MG tablet; Take 1 tablet (75 mg total) by mouth daily.  Dispense: 90 tablet; Refill: 1  4. Hyperglycemia  Last hgbA1C was 5.7% discussed life style changes  5. Gastric reflux  - omeprazole (PRILOSEC) 20 MG capsule; Take 1 capsule (20 mg total) by mouth daily.  Dispense: 90 capsule; Refill: 1  6. Cold sore  - valACYclovir (VALTREX) 1000 MG tablet; Take 1 tablet (1,000 mg total) by mouth 2 (two) times daily.  Dispense: 30 tablet; Refill: 0

## 2016-01-22 DIAGNOSIS — K080 Exfoliation of teeth due to systemic causes: Secondary | ICD-10-CM | POA: Diagnosis not present

## 2016-07-07 ENCOUNTER — Other Ambulatory Visit: Payer: Self-pay

## 2016-07-07 DIAGNOSIS — K219 Gastro-esophageal reflux disease without esophagitis: Secondary | ICD-10-CM

## 2016-07-07 MED ORDER — OMEPRAZOLE 20 MG PO CPDR: 20.0000 mg | DELAYED_RELEASE_CAPSULE | Freq: Every day | ORAL | 0 refills | Status: DC

## 2016-07-07 NOTE — Telephone Encounter (Signed)
Patient requesting refill of Omeprazole to [facility].

## 2016-07-07 NOTE — Telephone Encounter (Signed)
Appointment made for 08/26/15

## 2016-08-13 DIAGNOSIS — K080 Exfoliation of teeth due to systemic causes: Secondary | ICD-10-CM | POA: Diagnosis not present

## 2016-08-25 ENCOUNTER — Ambulatory Visit (INDEPENDENT_AMBULATORY_CARE_PROVIDER_SITE_OTHER): Payer: Federal, State, Local not specified - PPO | Admitting: Family Medicine

## 2016-08-25 ENCOUNTER — Encounter: Payer: Self-pay | Admitting: Family Medicine

## 2016-08-25 VITALS — BP 132/70 | HR 93 | Temp 97.7°F | Resp 18 | Ht 62.0 in | Wt 162.5 lb

## 2016-08-25 DIAGNOSIS — J3089 Other allergic rhinitis: Secondary | ICD-10-CM | POA: Diagnosis not present

## 2016-08-25 DIAGNOSIS — N951 Menopausal and female climacteric states: Secondary | ICD-10-CM

## 2016-08-25 DIAGNOSIS — H8111 Benign paroxysmal vertigo, right ear: Secondary | ICD-10-CM | POA: Insufficient documentation

## 2016-08-25 DIAGNOSIS — Z1239 Encounter for other screening for malignant neoplasm of breast: Secondary | ICD-10-CM

## 2016-08-25 DIAGNOSIS — R739 Hyperglycemia, unspecified: Secondary | ICD-10-CM

## 2016-08-25 DIAGNOSIS — B001 Herpesviral vesicular dermatitis: Secondary | ICD-10-CM | POA: Diagnosis not present

## 2016-08-25 DIAGNOSIS — H8110 Benign paroxysmal vertigo, unspecified ear: Secondary | ICD-10-CM | POA: Diagnosis not present

## 2016-08-25 DIAGNOSIS — E785 Hyperlipidemia, unspecified: Secondary | ICD-10-CM | POA: Diagnosis not present

## 2016-08-25 DIAGNOSIS — K219 Gastro-esophageal reflux disease without esophagitis: Secondary | ICD-10-CM | POA: Diagnosis not present

## 2016-08-25 DIAGNOSIS — Z1231 Encounter for screening mammogram for malignant neoplasm of breast: Secondary | ICD-10-CM

## 2016-08-25 DIAGNOSIS — Z79899 Other long term (current) drug therapy: Secondary | ICD-10-CM

## 2016-08-25 MED ORDER — VALACYCLOVIR HCL 1 G PO TABS: 1000.0000 mg | ORAL_TABLET | Freq: Two times a day (BID) | ORAL | 0 refills | Status: DC

## 2016-08-25 MED ORDER — OMEPRAZOLE 20 MG PO CPDR: 20.0000 mg | DELAYED_RELEASE_CAPSULE | Freq: Every day | ORAL | 1 refills | Status: DC

## 2016-08-25 MED ORDER — AZELASTINE HCL 0.1 % NA SOLN: 2.0000 | Freq: Every evening | NASAL | 1 refills | Status: DC

## 2016-08-25 MED ORDER — ATORVASTATIN CALCIUM 20 MG PO TABS: 20.0000 mg | ORAL_TABLET | Freq: Every day | ORAL | 1 refills | Status: DC

## 2016-08-25 MED ORDER — MOMETASONE FUROATE 50 MCG/ACT NA SUSP: 2.0000 | Freq: Every day | NASAL | 1 refills | Status: DC

## 2016-08-25 MED ORDER — VENLAFAXINE HCL 37.5 MG PO TABS: 37.5000 mg | ORAL_TABLET | Freq: Every day | ORAL | 3 refills | Status: DC

## 2016-08-25 NOTE — Progress Notes (Signed)
Name: Sara Randolph   MRN: [id number]    DOB: [birth year]   Date:08/25/2016       Progress Note  Subjective  Chief Complaint  Chief Complaint  Patient presents with  . Medication Refill  . Allergic Rhinitis     Well controlled with medication as needed  . Hyperlipidemia    Doing well with Atorvastatin  . Gastroesophageal Reflux    Takes Prilosec daily and controls symptoms  . Hot Flashes    Taking only half a pill daily. Nights she has wine to drink her hot flashes are increased  . Dizziness    Having more frequent episodes, onset from throwing up    HPI  MENOPAUSAL SYMPTOMS: She only has hot flashes, but improved with Effexor, only triggered by alcohol intake or when she eats more sweets. She states that Effexor ER caused drowsiness but is doing well on plain Effexor, and is on lower dose of 37.5 mg.   GERD: she has been taking Omeprazole for many years, and states since Fall of last year she has been having to take Omeprazole daily, instead of every other day. She states worse in the am after drinking coffee. She states that she was having some heartburn and regurgitation after drinking coffee. She states once she changed the type of coffee and started taking Omeprazole daily and symptoms are controlled. Discussed long term risk of using PPI daily and she will try to alternate with Ranitidine and try to go down again to prn PPI.   HYPERLIPIDEMIA: she is on Atorvastatin and is doing well , she is due for lab work, she has a EKG on records. No chest pain or SOB. Very active carrying for her two grandchildren - 65 yo each.  AR: she states she has been having more nasal congestion, rhinorrhea and occasional sneezing and would like to have refills of Nasonex. She states symptoms are good during the day, but at night she feels congested, she states at times she wakes up because of it. She states that when she takes nasal spray for more than a few days it causes nose bleeds. I will  give her Astelin to try and follow up with Dr. Ladene Artist  FEVER BLISTER: intermittent symptoms, needs refills of Valtrex, triggered by stress and sun exposure  METABOLIC SYNDROME: last Q000111Q 5.7% she denies polyphagia, polydipsia or polyuria. We will recheck labs  VERTIGO:  She had three episodes in the past and seen by Dr. Ladene Artist, it is triggered by movement of her head, we will refer her back to ENT  Patient Active Problem List   Diagnosis Date Noted  . BPPV (benign paroxysmal positional vertigo), unspecified laterality 08/25/2016  . Peripheral vertigo 01/13/2015  . Dyslipidemia 01/13/2015  . Cold sore 01/13/2015  . Gastric reflux 01/13/2015  . Perennial allergic rhinitis 01/13/2015  . Menopausal symptom 01/13/2015  . History of concussion 01/18/2013    Past Surgical History:  Procedure Laterality Date  . ABDOMINAL HYSTERECTOMY  1988  . CHOLECYSTECTOMY    . DILATION AND CURETTAGE OF UTERUS    . ECTOPIC PREGNANCY SURGERY    . RHINOPLASTY  1990  . SALPINGECTOMY    . TONSILLECTOMY  1969  . WRIST SURGERY  1983    Family History  Problem Relation Age of Onset  . Alzheimer's disease Mother   . Parkinsonism Father   . Heart disease Brother   . Heart attack Brother   . Depression Sister   . Osteoporosis Sister  Social History   Social History  . Marital status: Married    Spouse name: Buddy  . Number of children: 2  . Years of education: N/A   Occupational History  . Postmaster    Social History Main Topics  . Smoking status: Never Smoker  . Smokeless tobacco: Never Used  . Alcohol use 0.0 oz/week     Comment: Consumes alcohol on Saturday  . Drug use: No  . Sexual activity: Yes    Partners: Male   Other Topics Concern  . Not on file   Social History Narrative  . No narrative on file     Current Outpatient Prescriptions:  .  acetaminophen (TYLENOL) 500 MG tablet, Take 1 tablet by mouth daily., Disp: , Rfl:  .  aspirin 81 MG tablet, Take 1 tablet  by mouth daily., Disp: , Rfl:  .  atorvastatin (LIPITOR) 20 MG tablet, Take 1 tablet (20 mg total) by mouth daily., Disp: 90 tablet, Rfl: 1 .  cetirizine (ZYRTEC) 10 MG tablet, Take 10 mg by mouth daily., Disp: , Rfl:  .  Coenzyme Q-10 100 MG capsule, Take 1 capsule by mouth daily., Disp: , Rfl:  .  mometasone (NASONEX) 50 MCG/ACT nasal spray, Place 2 sprays into the nose daily., Disp: 51 g, Rfl: 1 .  omeprazole (PRILOSEC) 20 MG capsule, Take 1 capsule (20 mg total) by mouth daily., Disp: 90 capsule, Rfl: 0 .  valACYclovir (VALTREX) 1000 MG tablet, Take 1 tablet (1,000 mg total) by mouth 2 (two) times daily., Disp: 30 tablet, Rfl: 0 .  venlafaxine (EFFEXOR) 37.5 MG tablet, Take 1 tablet (37.5 mg total) by mouth daily., Disp: 90 tablet, Rfl: 3  Allergies  Allergen Reactions  . Pravastatin Itching    dizziness  . Hydrocodone Nausea Only     ROS  Constitutional: Negative for fever or weight change.  Respiratory: Negative for cough and shortness of breath.   Cardiovascular: Negative for chest pain or palpitations.  Gastrointestinal: Negative for abdominal pain, no bowel changes.  Musculoskeletal: Negative for gait problem or joint swelling.  Skin: Negative for rash.  Neurological: Positive  For intermittent  Dizziness, no headache.  No other specific complaints in a complete review of systems (except as listed in HPI above).  Objective  Vitals:   08/25/16 0855  BP: 132/70  Pulse: 93  Resp: 18  Temp: 97.7 F (36.5 C)  TempSrc: Oral  SpO2: 97%  Weight: 162 lb 8 oz (73.7 kg)  Height: 5\' 2"  (1.575 m)    Body mass index is 29.72 kg/m.  Physical Exam  Constitutional: Patient appears well-developed and well-nourished. Obese  No distress.  HEENT: head atraumatic, normocephalic, pupils equal and reactive to light,  neck supple, throat within normal limits Cardiovascular: Normal rate, regular rhythm and normal heart sounds.  No murmur heard. No BLE edema. Pulmonary/Chest: Effort  normal and breath sounds normal. No respiratory distress. Abdominal: Soft.  There is no tenderness. Psychiatric: Patient has a normal mood and affect. behavior is normal. Judgment and thought content normal. Neurologist: no focal findings, no nystagmus  PHQ2/9: Depression screen [facility] 2/9 08/25/2016 11/06/2015 08/07/2015 01/13/2015  Decreased Interest 0 0 0 0  Down, Depressed, Hopeless 0 0 0 0  PHQ - 2 Score 0 0 0 0     Fall Risk: Fall Risk  08/25/2016 11/06/2015 08/07/2015 01/13/2015  Falls in the past year? No No No No     Functional Status Survey: Is the patient deaf or have difficulty hearing?:  No Does the patient have difficulty seeing, even when wearing glasses/contacts?: No Does the patient have difficulty concentrating, remembering, or making decisions?: No Does the patient have difficulty walking or climbing stairs?: No Does the patient have difficulty dressing or bathing?: No Does the patient have difficulty doing errands alone such as visiting a doctor's office or shopping?: No    Assessment & Plan  1. Gastric reflux  - omeprazole (PRILOSEC) 20 MG capsule; Take 1 capsule (20 mg total) by mouth daily.  Dispense: 90 capsule; Refill: 1  2. BPPV (benign paroxysmal positional vertigo), unspecified laterality  - Ambulatory referral to ENT  3. Dyslipidemia  - atorvastatin (LIPITOR) 20 MG tablet; Take 1 tablet (20 mg total) by mouth daily.  Dispense: 90 tablet; Refill: 1 - Lipid panel  4. Hyperglycemia  - Hemoglobin A1c - Insulin, fasting  5. Perennial allergic rhinitis  - mometasone (NASONEX) 50 MCG/ACT nasal spray; Place 2 sprays into the nose daily.  Dispense: 51 g; Refill: 1  6. Breast cancer screening  - MM Digital Screening; Future  7. Menopausal symptom  - venlafaxine (EFFEXOR) 37.5 MG tablet; Take 1 tablet (37.5 mg total) by mouth daily.  Dispense: 90 tablet; Refill: 3  8. Cold sore  - valACYclovir (VALTREX) 1000 MG tablet; Take 1 tablet (1,000 mg total) by  mouth 2 (two) times daily.  Dispense: 30 tablet; Refill: 0  9. Long-term use of high-risk medication  - COMPLETE METABOLIC PANEL WITH GFR - CBC with Differential/Platelet

## 2016-08-28 ENCOUNTER — Encounter: Payer: Self-pay | Admitting: Family Medicine

## 2016-09-17 DIAGNOSIS — K080 Exfoliation of teeth due to systemic causes: Secondary | ICD-10-CM | POA: Diagnosis not present

## 2016-09-24 ENCOUNTER — Ambulatory Visit: Payer: Self-pay

## 2016-09-24 ENCOUNTER — Other Ambulatory Visit: Payer: Self-pay | Admitting: Family Medicine

## 2016-09-24 DIAGNOSIS — Z79899 Other long term (current) drug therapy: Secondary | ICD-10-CM | POA: Diagnosis not present

## 2016-09-24 DIAGNOSIS — J301 Allergic rhinitis due to pollen: Secondary | ICD-10-CM | POA: Diagnosis not present

## 2016-09-24 DIAGNOSIS — R42 Dizziness and giddiness: Secondary | ICD-10-CM | POA: Diagnosis not present

## 2016-09-24 DIAGNOSIS — E785 Hyperlipidemia, unspecified: Secondary | ICD-10-CM | POA: Diagnosis not present

## 2016-09-24 DIAGNOSIS — R739 Hyperglycemia, unspecified: Secondary | ICD-10-CM | POA: Diagnosis not present

## 2016-09-24 LAB — CBC WITH DIFFERENTIAL/PLATELET
Basophils Absolute: 0 cells/uL (ref 0–200)
Basophils Relative: 0 %
Eosinophils Absolute: 0 cells/uL — ABNORMAL LOW (ref 15–500)
Eosinophils Relative: 0 %
HCT: 43.1 % (ref 35.0–45.0)
Hemoglobin: 14.1 g/dL (ref 11.7–15.5)
Lymphocytes Relative: 23 %
Lymphs Abs: 1564 cells/uL (ref 850–3900)
MCH: 28.5 pg (ref 27.0–33.0)
MCHC: 32.7 g/dL (ref 32.0–36.0)
MCV: 87.1 fL (ref 80.0–100.0)
MPV: 10.1 fL (ref 7.5–12.5)
Monocytes Absolute: 476 cells/uL (ref 200–950)
Monocytes Relative: 7 %
Neutro Abs: 4760 cells/uL (ref 1500–7800)
Neutrophils Relative %: 70 %
Platelets: 320 10*3/uL (ref 140–400)
RBC: 4.95 MIL/uL (ref 3.80–5.10)
RDW: 13.3 % (ref 11.0–15.0)
WBC: 6.8 10*3/uL (ref 3.8–10.8)

## 2016-09-24 LAB — HEMOGLOBIN A1C
Hgb A1c MFr Bld: 5.4 % (ref <5.7)
Mean Plasma Glucose: 108 mg/dL

## 2016-09-25 LAB — COMPLETE METABOLIC PANEL WITH GFR
ALT: 13 U/L (ref 6–29)
AST: 12 U/L (ref 10–35)
Albumin: 4.3 g/dL (ref 3.6–5.1)
Alkaline Phosphatase: 71 U/L (ref 33–130)
BUN: 12 mg/dL (ref 7–25)
CO2: 28 mmol/L (ref 20–31)
Calcium: 9.5 mg/dL (ref 8.6–10.4)
Chloride: 102 mmol/L (ref 98–110)
Creat: 0.81 mg/dL (ref 0.50–1.05)
GFR, Est African American: >89 mL/min (ref >=60)
GFR, Est Non African American: 81 mL/min (ref >=60)
Glucose, Bld: 97 mg/dL (ref 65–99)
Potassium: 4.8 mmol/L (ref 3.5–5.3)
Sodium: 140 mmol/L (ref 135–146)
Total Bilirubin: 0.6 mg/dL (ref 0.2–1.2)
Total Protein: 6.9 g/dL (ref 6.1–8.1)

## 2016-09-25 LAB — LIPID PANEL
Cholesterol: 170 mg/dL (ref <200)
HDL: 44 mg/dL — ABNORMAL LOW (ref >50)
LDL Cholesterol: 89 mg/dL (ref <100)
Total CHOL/HDL Ratio: 3.9 Ratio (ref <5.0)
Triglycerides: 183 mg/dL — ABNORMAL HIGH (ref <150)
VLDL: 37 mg/dL — ABNORMAL HIGH (ref <30)

## 2016-09-25 LAB — INSULIN, FASTING: Insulin fasting, serum: 8.3 u[IU]/mL (ref 2.0–19.6)

## 2016-10-08 DIAGNOSIS — H8111 Benign paroxysmal vertigo, right ear: Secondary | ICD-10-CM | POA: Diagnosis not present

## 2016-10-15 ENCOUNTER — Ambulatory Visit
Admission: RE | Admit: 2016-10-15 | Discharge: 2016-10-15 | Disposition: A | Discharge status: Home / Self Care | Payer: Federal, State, Local not specified - PPO | Source: Ambulatory Visit | Attending: Family Medicine | Admitting: Family Medicine

## 2016-10-15 DIAGNOSIS — Z1231 Encounter for screening mammogram for malignant neoplasm of breast: Secondary | ICD-10-CM | POA: Insufficient documentation

## 2016-10-15 DIAGNOSIS — Z1239 Encounter for other screening for malignant neoplasm of breast: Secondary | ICD-10-CM

## 2017-01-05 ENCOUNTER — Encounter: Payer: Self-pay | Admitting: Family Medicine

## 2017-01-05 ENCOUNTER — Other Ambulatory Visit: Payer: Self-pay | Admitting: Family Medicine

## 2017-01-05 DIAGNOSIS — B001 Herpesviral vesicular dermatitis: Secondary | ICD-10-CM

## 2017-01-05 NOTE — Telephone Encounter (Signed)
Patient requesting refill of Valtrex to [facility].

## 2017-02-22 ENCOUNTER — Encounter: Payer: Self-pay | Admitting: Family Medicine

## 2017-02-22 ENCOUNTER — Ambulatory Visit (INDEPENDENT_AMBULATORY_CARE_PROVIDER_SITE_OTHER): Payer: Federal, State, Local not specified - PPO | Admitting: Family Medicine

## 2017-02-22 VITALS — BP 114/64 | HR 76 | Temp 97.6°F | Resp 16 | Ht 62.0 in | Wt 166.6 lb

## 2017-02-22 DIAGNOSIS — N393 Stress incontinence (female) (male): Secondary | ICD-10-CM

## 2017-02-22 DIAGNOSIS — K219 Gastro-esophageal reflux disease without esophagitis: Secondary | ICD-10-CM

## 2017-02-22 DIAGNOSIS — R739 Hyperglycemia, unspecified: Secondary | ICD-10-CM | POA: Diagnosis not present

## 2017-02-22 DIAGNOSIS — R5383 Other fatigue: Secondary | ICD-10-CM

## 2017-02-22 DIAGNOSIS — Z0000 Encounter for general adult medical examination without abnormal findings: Secondary | ICD-10-CM | POA: Diagnosis not present

## 2017-02-22 DIAGNOSIS — J3089 Other allergic rhinitis: Secondary | ICD-10-CM

## 2017-02-22 DIAGNOSIS — Z01419 Encounter for gynecological examination (general) (routine) without abnormal findings: Secondary | ICD-10-CM

## 2017-02-22 DIAGNOSIS — E785 Hyperlipidemia, unspecified: Secondary | ICD-10-CM

## 2017-02-22 LAB — CBC WITH DIFFERENTIAL/PLATELET
Basophils Absolute: 0 cells/uL (ref 0–200)
Basophils Relative: 0 %
Eosinophils Absolute: 0 cells/uL — ABNORMAL LOW (ref 15–500)
Eosinophils Relative: 0 %
HCT: 44.8 % (ref 35.0–45.0)
Hemoglobin: 14.7 g/dL (ref 11.7–15.5)
Lymphocytes Relative: 27 %
Lymphs Abs: 1458 cells/uL (ref 850–3900)
MCH: 28.6 pg (ref 27.0–33.0)
MCHC: 32.8 g/dL (ref 32.0–36.0)
MCV: 87.2 fL (ref 80.0–100.0)
MPV: 9.5 fL (ref 7.5–12.5)
Monocytes Absolute: 270 cells/uL (ref 200–950)
Monocytes Relative: 5 %
Neutro Abs: 3672 cells/uL (ref 1500–7800)
Neutrophils Relative %: 68 %
Platelets: 310 10*3/uL (ref 140–400)
RBC: 5.14 MIL/uL — ABNORMAL HIGH (ref 3.80–5.10)
RDW: 13.1 % (ref 11.0–15.0)
WBC: 5.4 10*3/uL (ref 3.8–10.8)

## 2017-02-22 MED ORDER — OMEPRAZOLE 20 MG PO CPDR: 20.0000 mg | DELAYED_RELEASE_CAPSULE | Freq: Every day | ORAL | 1 refills | Status: DC

## 2017-02-22 NOTE — Patient Instructions (Signed)
Preventive Care 40-64 Years, Female Preventive care refers to lifestyle choices and visits with your health care provider that can promote health and wellness. What does preventive care include?  A yearly physical exam. This is also called an annual well check.  Dental exams once or twice a year.  Routine eye exams. Ask your health care provider how often you should have your eyes checked.  Personal lifestyle choices, including: ? Daily care of your teeth and gums. ? Regular physical activity. ? Eating a healthy diet. ? Avoiding tobacco and drug use. ? Limiting alcohol use. ? Practicing safe sex. ? Taking low-dose aspirin daily starting at age 58. ? Taking vitamin and mineral supplements as recommended by your health care provider. What happens during an annual well check? The services and screenings done by your health care provider during your annual well check will depend on your age, overall health, lifestyle risk factors, and family history of disease. Counseling Your health care provider may ask you questions about your:  Alcohol use.  Tobacco use.  Drug use.  Emotional well-being.  Home and relationship well-being.  Sexual activity.  Eating habits.  Work and work Statistician.  Method of birth control.  Menstrual cycle.  Pregnancy history.  Screening You may have the following tests or measurements:  Height, weight, and BMI.  Blood pressure.  Lipid and cholesterol levels. These may be checked every 5 years, or more frequently if you are over 81 years old.  Skin check.  Lung cancer screening. You may have this screening every year starting at age 78 if you have a 30-pack-year history of smoking and currently smoke or have quit within the past 15 years.  Fecal occult blood test (FOBT) of the stool. You may have this test every year starting at age 65.  Flexible sigmoidoscopy or colonoscopy. You may have a sigmoidoscopy every 5 years or a colonoscopy  every 10 years starting at age 30.  Hepatitis C blood test.  Hepatitis B blood test.  Sexually transmitted disease (STD) testing.  Diabetes screening. This is done by checking your blood sugar (glucose) after you have not eaten for a while (fasting). You may have this done every 1-3 years.  Mammogram. This may be done every 1-2 years. Talk to your health care provider about when you should start having regular mammograms. This may depend on whether you have a family history of breast cancer.  BRCA-related cancer screening. This may be done if you have a family history of breast, ovarian, tubal, or peritoneal cancers.  Pelvic exam and Pap test. This may be done every 3 years starting at age 80. Starting at age 36, this may be done every 5 years if you have a Pap test in combination with an HPV test.  Bone density scan. This is done to screen for osteoporosis. You may have this scan if you are at high risk for osteoporosis.  Discuss your test results, treatment options, and if necessary, the need for more tests with your health care provider. Vaccines Your health care provider may recommend certain vaccines, such as:  Influenza vaccine. This is recommended every year.  Tetanus, diphtheria, and acellular pertussis (Tdap, Td) vaccine. You may need a Td booster every 10 years.  Varicella vaccine. You may need this if you have not been vaccinated.  Zoster vaccine. You may need this after age 5.  Measles, mumps, and rubella (MMR) vaccine. You may need at least one dose of MMR if you were born in  1957 or later. You may also need a second dose.  Pneumococcal 13-valent conjugate (PCV13) vaccine. You may need this if you have certain conditions and were not previously vaccinated.  Pneumococcal polysaccharide (PPSV23) vaccine. You may need one or two doses if you smoke cigarettes or if you have certain conditions.  Meningococcal vaccine. You may need this if you have certain  conditions.  Hepatitis A vaccine. You may need this if you have certain conditions or if you travel or work in places where you may be exposed to hepatitis A.  Hepatitis B vaccine. You may need this if you have certain conditions or if you travel or work in places where you may be exposed to hepatitis B.  Haemophilus influenzae type b (Hib) vaccine. You may need this if you have certain conditions.  Talk to your health care provider about which screenings and vaccines you need and how often you need them. This information is not intended to replace advice given to you by your health care provider. Make sure you discuss any questions you have with your health care provider. Document Released: 08/15/2015 Document Revised: 04/07/2016 Document Reviewed: 05/20/2015 Elsevier Interactive Patient Education  2017 [employer].

## 2017-02-22 NOTE — Progress Notes (Signed)
Name: Sara Randolph   MRN: [id number]    DOB: 1961-08-02   Date:02/22/2017       Progress Note  Subjective  Chief Complaint  Chief Complaint  Patient presents with  . Annual Exam  . Gastroesophageal Reflux  . Hyperlipidemia    HPI  WELL WOMAN: she has not been sexually active for the past 6 months, husband is 38 years older and has ED, they also snore at night and have been sleeping in separate rooms. She states she only snores when very tired, not daily. She denies waking up with headaches, feels tired on and off but no on a regular basis.   STRESS INCONTINENCE: she has noticed that when she sneezes she loses urine, she states she is trying to use bathroom more often and no recent episodes, no urgency or dysuria noticed.   MENOPAUSAL SYMPTOMS: She only has hot flashes, but improved with Effexor, only triggered by alcohol intake or when she eats more sweets. She states that Effexor ER caused drowsiness but is doing well on plain Effexor, and is on lower dose of 37.5 mg, she is taking it at night.   GERD: she has been taking Omeprazole for many years, and states since Fall of last year she tried weaning self off, but started to have symptoms even when drinking water, she is back on Omeprazole 20 mg daily, she denies heart burn or regurgitation at this time  HYPERLIPIDEMIA: she was on Atorvastatin but stopped months ago because it was causing severe muscle aches.   AR: she had severe symptoms this past Spring, but is doing well now, occasional sneeze but no cough or rhinorrhea.   FEVER BLISTER: intermittent symptoms, needs refills of Valtrex, triggered by stress and sun exposure  METABOLIC SYNDROME: last JKDT2I 5.7% she denies polyphagia, polydipsia or polyuria. We will recheck labs  VERTIGO: she has BBPV seen ENT, and is doing well now.   HYPERGLYCEMIA: she has polyphagia, but denies polydipsia or polyuria. Discussed Metformin and GLP-1 agonist  Patient Active Problem  List   Diagnosis Date Noted  . BPPV (benign paroxysmal positional vertigo), unspecified laterality 08/25/2016  . Peripheral vertigo 01/13/2015  . Dyslipidemia 01/13/2015  . Cold sore 01/13/2015  . Gastric reflux 01/13/2015  . Perennial allergic rhinitis 01/13/2015  . Menopausal symptom 01/13/2015  . History of concussion 01/18/2013    Past Surgical History:  Procedure Laterality Date  . ABDOMINAL HYSTERECTOMY  1988  . CHOLECYSTECTOMY    . DILATION AND CURETTAGE OF UTERUS    . ECTOPIC PREGNANCY SURGERY    . RHINOPLASTY  1990  . SALPINGECTOMY    . TONSILLECTOMY  1969  . WRIST SURGERY  1983    Family History  Problem Relation Age of Onset  . Alzheimer's disease Mother   . Parkinsonism Father   . Heart disease Brother   . Heart attack Brother   . Depression Sister   . Osteoporosis Sister     Social History   Social History  . Marital status: Married    Spouse name: Buddy  . Number of children: 2  . Years of education: N/A   Occupational History  . Postmaster    Social History Main Topics  . Smoking status: Never Smoker  . Smokeless tobacco: Never Used  . Alcohol use 0.0 oz/week     Comment: Consumes alcohol on Saturday  . Drug use: No  . Sexual activity: Yes    Partners: Male   Other Topics Concern  . Not  on file   Social History Narrative  . No narrative on file     Current Outpatient Prescriptions:  .  acetaminophen (TYLENOL) 500 MG tablet, Take 1 tablet by mouth daily., Disp: , Rfl:  .  aspirin 81 MG tablet, Take 1 tablet by mouth daily., Disp: , Rfl:  .  azelastine (ASTELIN) 0.1 % nasal spray, Place 2 sprays into both nostrils every evening. Use in each nostril as directed, Disp: 90 mL, Rfl: 1 .  cetirizine (ZYRTEC) 10 MG tablet, Take 10 mg by mouth daily., Disp: , Rfl:  .  Ginger 500 MG CAPS, Take 2 capsules by mouth daily., Disp: , Rfl:  .  mometasone (NASONEX) 50 MCG/ACT nasal spray, Place 2 sprays into the nose daily., Disp: 51 g, Rfl: 1 .   omeprazole (PRILOSEC) 20 MG capsule, Take 1 capsule (20 mg total) by mouth daily., Disp: 90 capsule, Rfl: 1 .  valACYclovir (VALTREX) 1000 MG tablet, TAKE ONE TABLET BY MOUTH TWICE DAILY, Disp: 30 tablet, Rfl: 0 .  venlafaxine (EFFEXOR) 37.5 MG tablet, Take 1 tablet (37.5 mg total) by mouth daily., Disp: 90 tablet, Rfl: 3 .  Coenzyme Q-10 100 MG capsule, Take 1 capsule by mouth daily., Disp: , Rfl:   Allergies  Allergen Reactions  . Atorvastatin     Muscle ache  . Pravastatin Itching    dizziness  . Hydrocodone Nausea Only     ROS  Constitutional: Negative for fever, mild increase in  Weight. Respiratory: Negative for cough and shortness of breath.   Cardiovascular: Negative for chest pain or palpitations.  Gastrointestinal: Negative for abdominal pain, no bowel changes.  Musculoskeletal: Negative for gait problem or joint swelling.  Skin: Negative for rash.  Neurological: Negative for dizziness ( BBPV resolved) or headache.  No other specific complaints in a complete review of systems (except as listed in HPI above).  Objective  Vitals:   02/22/17 0821  BP: 114/64  Pulse: 76  Resp: 16  Temp: 97.6 F (36.4 C)  TempSrc: Oral  SpO2: 98%  Weight: 166 lb 9.6 oz (75.6 kg)  Height: 5\' 2"  (1.575 m)    Body mass index is 30.47 kg/m.  Physical Exam  Constitutional: Patient appears well-developed and obese No distress.  HENT: Head: Normocephalic and atraumatic. Ears: B TMs ok, no erythema or effusion; Nose: Nose normal. Mouth/Throat: Oropharynx is clear and moist. No oropharyngeal exudate.  Eyes: Conjunctivae and EOM are normal. Pupils are equal, round, and reactive to light. No scleral icterus.  Neck: Normal range of motion. Neck supple. No JVD present. No thyromegaly present.  Cardiovascular: Normal rate, regular rhythm and normal heart sounds.  No murmur heard. No BLE edema. Pulmonary/Chest: Effort normal and breath sounds normal. No respiratory distress. Abdominal: Soft.  Bowel sounds are normal, no distension. There is no tenderness. no masses Breast: no lumps or masses, no nipple discharge or rashes FEMALE GENITALIA:  External genitalia normal External urethra normal Pelvic not done RECTAL: no rectal masses or hemorrhoids , she has a skin tag Musculoskeletal: Normal range of motion, no joint effusions. No gross deformities Neurological: he is alert and oriented to person, place, and time. No cranial nerve deficit. Coordination, balance, strength, speech and gait are normal.  Skin: Skin is warm and dry. No rash noted. No erythema.  Psychiatric: Patient has a normal mood and affect. behavior is normal. Judgment and thought content normal.  PHQ2/9: Depression screen [facility] 2/9 02/22/2017 08/25/2016 11/06/2015 08/07/2015 01/13/2015  Decreased Interest 0 0 0  0 0  Down, Depressed, Hopeless 0 0 0 0 0  PHQ - 2 Score 0 0 0 0 0    Fall Risk: Fall Risk  02/22/2017 08/25/2016 11/06/2015 08/07/2015 01/13/2015  Falls in the past year? No No No No No   Functional Status Survey: Is the patient deaf or have difficulty hearing?: No Does the patient have difficulty seeing, even when wearing glasses/contacts?: No Does the patient have difficulty concentrating, remembering, or making decisions?: No Does the patient have difficulty walking or climbing stairs?: No Does the patient have difficulty dressing or bathing?: No Does the patient have difficulty doing errands alone such as visiting a doctor's office or shopping?: No  Current Exercise Habits: The patient does not participate in regular exercise at present    Assessment & Plan  1. Well woman exam  Discussed importance of 150 minutes of physical activity weekly, eat two servings of fish weekly, eat one serving of tree nuts ( cashews, pistachios, pecans, almonds.Marland Kitchen) every other day, eat 6 servings of fruit/vegetables daily and drink plenty of water and avoid sweet beverages.   2. Dyslipidemia  - Lipid panel  3.  Hyperglycemia  - Hemoglobin A1c - Insulin, fasting  4. Perennial allergic rhinitis  Doing well now, had a tough allergy season   5. Stress incontinence in female  Discussed PT referral but she wants to hold off for now  6. Gastric reflux  - omeprazole (PRILOSEC) 20 MG capsule; Take 1 capsule (20 mg total) by mouth daily.  Dispense: 90 capsule; Refill: 1  7. Other fatigue  - COMPLETE METABOLIC PANEL WITH GFR - CBC with Differential/Platelet - VITAMIN D 25 Hydroxy (Vit-D Deficiency, Fractures) - Vitamin B12 - TSH

## 2017-02-23 DIAGNOSIS — H16042 Marginal corneal ulcer, left eye: Secondary | ICD-10-CM | POA: Diagnosis not present

## 2017-02-23 LAB — COMPLETE METABOLIC PANEL WITH GFR
ALT: 11 U/L (ref 6–29)
AST: 12 U/L (ref 10–35)
Albumin: 4.4 g/dL (ref 3.6–5.1)
Alkaline Phosphatase: 61 U/L (ref 33–130)
BUN: 16 mg/dL (ref 7–25)
CO2: 25 mmol/L (ref 20–31)
Calcium: 9.3 mg/dL (ref 8.6–10.4)
Chloride: 102 mmol/L (ref 98–110)
Creat: 0.78 mg/dL (ref 0.50–1.05)
GFR, Est African American: >89 mL/min (ref >=60)
GFR, Est Non African American: 84 mL/min (ref >=60)
Glucose, Bld: 104 mg/dL — ABNORMAL HIGH (ref 65–99)
Potassium: 4.3 mmol/L (ref 3.5–5.3)
Sodium: 136 mmol/L (ref 135–146)
Total Bilirubin: 0.6 mg/dL (ref 0.2–1.2)
Total Protein: 7.1 g/dL (ref 6.1–8.1)

## 2017-02-23 LAB — VITAMIN B12: Vitamin B-12: 337 pg/mL (ref 200–1100)

## 2017-02-23 LAB — LIPID PANEL
Cholesterol: 282 mg/dL — ABNORMAL HIGH (ref <200)
HDL: 48 mg/dL — ABNORMAL LOW (ref >50)
LDL Cholesterol: 198 mg/dL — ABNORMAL HIGH (ref <100)
Total CHOL/HDL Ratio: 5.9 Ratio — ABNORMAL HIGH (ref <5.0)
Triglycerides: 181 mg/dL — ABNORMAL HIGH (ref <150)
VLDL: 36 mg/dL — ABNORMAL HIGH (ref <30)

## 2017-02-23 LAB — HEMOGLOBIN A1C
Hgb A1c MFr Bld: 5.5 % (ref <5.7)
Mean Plasma Glucose: 111 mg/dL

## 2017-02-23 LAB — INSULIN, FASTING: Insulin fasting, serum: 12.2 u[IU]/mL (ref 2.0–19.6)

## 2017-02-23 LAB — VITAMIN D 25 HYDROXY (VIT D DEFICIENCY, FRACTURES): Vit D, 25-Hydroxy: 19 ng/mL — ABNORMAL LOW (ref 30–100)

## 2017-02-23 LAB — TSH: TSH: 1.35 mIU/L

## 2017-02-24 ENCOUNTER — Other Ambulatory Visit: Payer: Self-pay | Admitting: Family Medicine

## 2017-02-24 ENCOUNTER — Encounter: Payer: Self-pay | Admitting: Family Medicine

## 2017-02-24 DIAGNOSIS — E559 Vitamin D deficiency, unspecified: Secondary | ICD-10-CM | POA: Insufficient documentation

## 2017-02-24 MED ORDER — CYANOCOBALAMIN 1000 MCG SL SUBL: 1.0000 | SUBLINGUAL_TABLET | Freq: Every day | SUBLINGUAL | 0 refills | Status: DC

## 2017-02-24 MED ORDER — VITAMIN D (ERGOCALCIFEROL) 1.25 MG (50000 UNIT) PO CAPS
50000.0000 [IU] | ORAL_CAPSULE | ORAL | 0 refills | Status: DC
Start: 2017-02-24 — End: 2017-08-25

## 2017-03-02 ENCOUNTER — Other Ambulatory Visit: Payer: Self-pay | Admitting: Family Medicine

## 2017-03-02 ENCOUNTER — Encounter: Payer: Self-pay | Admitting: Family Medicine

## 2017-03-02 MED ORDER — EVOLOCUMAB 140 MG/ML ~~LOC~~ SOSY: 140.0000 mg | PREFILLED_SYRINGE | SUBCUTANEOUS | 5 refills | Status: DC

## 2017-03-02 NOTE — Progress Notes (Signed)
Patient notified about prescription at pharmacy and also about coupon cards online to help her save money.

## 2017-03-20 ENCOUNTER — Encounter: Payer: Self-pay | Admitting: Family Medicine

## 2017-03-22 DIAGNOSIS — K080 Exfoliation of teeth due to systemic causes: Secondary | ICD-10-CM | POA: Diagnosis not present

## 2017-03-24 ENCOUNTER — Other Ambulatory Visit: Payer: Self-pay | Admitting: Family Medicine

## 2017-03-24 MED ORDER — ROSUVASTATIN CALCIUM 5 MG PO TABS: 5.0000 mg | ORAL_TABLET | Freq: Every day | ORAL | 2 refills | Status: DC

## 2017-03-29 ENCOUNTER — Ambulatory Visit: Payer: Federal, State, Local not specified - PPO

## 2017-05-03 ENCOUNTER — Ambulatory Visit (INDEPENDENT_AMBULATORY_CARE_PROVIDER_SITE_OTHER): Payer: Federal, State, Local not specified - PPO

## 2017-05-03 DIAGNOSIS — Z23 Encounter for immunization: Secondary | ICD-10-CM

## 2017-05-26 DIAGNOSIS — R42 Dizziness and giddiness: Secondary | ICD-10-CM | POA: Diagnosis not present

## 2017-06-29 ENCOUNTER — Other Ambulatory Visit: Payer: Self-pay | Admitting: Family Medicine

## 2017-06-29 DIAGNOSIS — E785 Hyperlipidemia, unspecified: Secondary | ICD-10-CM

## 2017-06-29 NOTE — Telephone Encounter (Signed)
Refill Request for Cholesterol medication: Crestor to [facility].  Last visit: 02/22/2017  Lab Results  Component Value Date   CHOL 282 (H) 02/22/2017   HDL 48 (L) 02/22/2017   LDLCALC 198 (H) 02/22/2017   TRIG 181 (H) 02/22/2017   CHOLHDL 5.9 (H) 02/22/2017    Next visit: 08/25/2017

## 2017-06-29 NOTE — Telephone Encounter (Signed)
Notes and labs reviewed, pt have follow up 08/25/2016. Refill provided.

## 2017-07-02 ENCOUNTER — Other Ambulatory Visit: Payer: Self-pay | Admitting: Family Medicine

## 2017-07-02 DIAGNOSIS — B001 Herpesviral vesicular dermatitis: Secondary | ICD-10-CM

## 2017-07-12 ENCOUNTER — Ambulatory Visit: Payer: Federal, State, Local not specified - PPO | Admitting: Family Medicine

## 2017-07-12 ENCOUNTER — Encounter: Payer: Self-pay | Admitting: Family Medicine

## 2017-07-12 VITALS — BP 110/64 | HR 96 | Temp 98.4°F | Resp 16 | Ht 62.0 in | Wt 170.1 lb

## 2017-07-12 DIAGNOSIS — H9201 Otalgia, right ear: Secondary | ICD-10-CM

## 2017-07-12 DIAGNOSIS — R05 Cough: Secondary | ICD-10-CM

## 2017-07-12 DIAGNOSIS — J40 Bronchitis, not specified as acute or chronic: Secondary | ICD-10-CM | POA: Diagnosis not present

## 2017-07-12 DIAGNOSIS — R059 Cough, unspecified: Secondary | ICD-10-CM

## 2017-07-12 MED ORDER — BUDESONIDE-FORMOTEROL FUMARATE 80-4.5 MCG/ACT IN AERO: 2.0000 | INHALATION_SPRAY | Freq: Two times a day (BID) | RESPIRATORY_TRACT | 3 refills | Status: DC

## 2017-07-12 MED ORDER — AZITHROMYCIN 250 MG PO TABS: ORAL_TABLET | ORAL | 0 refills | Status: DC

## 2017-07-12 MED ORDER — BENZONATATE 100 MG PO CAPS: 100.0000 mg | ORAL_CAPSULE | Freq: Three times a day (TID) | ORAL | 0 refills | Status: DC | PRN

## 2017-07-12 NOTE — Progress Notes (Signed)
Name: Sara Randolph   MRN: [id number]    DOB: Dec 14, 1960   Date:07/12/2017       Progress Note  Subjective  Chief Complaint  Chief Complaint  Patient presents with  . Cough    patient presents with a terrible deep cough that takes her breath away. sx started about 15 days ago. OTC: Mucinex 12 hr expectorant, Delsym and cough drops  . Headache    frontal heahache.  . Ear Pain    right ear only, has cotton in ear now. otc: ibuprofen  . Shortness of Breath    after she has a bronchial spasm  . Wheezing    after coughing.  . Nasal Congestion    some bloody mucus. patient has used some Astelin. OTC: 2 bottle of Day-Quil  . Sore Throat    raw and very irritated. lots of fluids  . Chills    patient stated that she had no fever but could not get warm  . Fatigue  . Temporomandibular Joint Pain    patient stated that since she has been sitting up at night due to the sickness, she is now having lots of discomfort.    HPI  Patient presents with concern for 15 days of respiratory illness.  Started as dry cough, progressed to productive cough.  Endorses sore throat, nasal congestion, RIGHT ear pain, TMJ worsening (has hx, but it is worse with illness), insomnia due to cough is causing increased fatigue, some wheezing after coughing; endorses some wheezing and burning sensation in chest occasionally after coughing (goes away in a few minutes), and headache.  Endorses stress incontinence from coughing.  Tomorrow she starts watching her 65 week old grandson; already watches a 2.5yo.  Has been taking mucinex, dayquil, delsym, and ibuprofen PRN.  Denies NVD, facial pain, chest pain, shortness of breath (except with coughing fits).  Patient Active Problem List   Diagnosis Date Noted  . Vitamin D deficiency 02/24/2017  . BPPV (benign paroxysmal positional vertigo), unspecified laterality 08/25/2016  . Peripheral vertigo 01/13/2015  . Dyslipidemia 01/13/2015  . Cold sore 01/13/2015  .  Gastric reflux 01/13/2015  . Perennial allergic rhinitis 01/13/2015  . Menopausal symptom 01/13/2015  . History of concussion 01/18/2013    Social History   Tobacco Use  . Smoking status: Never Smoker  . Smokeless tobacco: Never Used  Substance Use Topics  . Alcohol use: Yes    Alcohol/week: 0.0 oz    Comment: Consumes alcohol on Saturday    Current Outpatient Medications:  .  acetaminophen (TYLENOL) 500 MG tablet, Take 1 tablet by mouth daily., Disp: , Rfl:  .  aspirin 81 MG tablet, Take 1 tablet by mouth daily., Disp: , Rfl:  .  azelastine (ASTELIN) 0.1 % nasal spray, Place 2 sprays into both nostrils every evening. Use in each nostril as directed, Disp: 90 mL, Rfl: 1 .  cetirizine (ZYRTEC) 10 MG tablet, Take 10 mg by mouth daily., Disp: , Rfl:  .  Coenzyme Q-10 100 MG capsule, Take 1 capsule by mouth daily., Disp: , Rfl:  .  Cyanocobalamin (B-12-SL) 1000 MCG SUBL, Place 1 tablet (1,000 mcg total) under the tongue daily., Disp: 30 tablet, Rfl: 0 .  Ginger 500 MG CAPS, Take 2 capsules by mouth daily., Disp: , Rfl:  .  omeprazole (PRILOSEC) 20 MG capsule, Take 1 capsule (20 mg total) by mouth daily., Disp: 90 capsule, Rfl: 1 .  rosuvastatin (CRESTOR) 5 MG tablet, TAKE 1 TABLET BY MOUTH ONCE DAILY,  Disp: 90 tablet, Rfl: 0 .  valACYclovir (VALTREX) 1000 MG tablet, TAKE 1 TABLET BY MOUTH TWICE DAILY, Disp: 30 tablet, Rfl: 0 .  venlafaxine (EFFEXOR) 37.5 MG tablet, Take 1 tablet (37.5 mg total) by mouth daily., Disp: 90 tablet, Rfl: 3 .  Vitamin D, Ergocalciferol, (DRISDOL) 50000 units CAPS capsule, Take 1 capsule (50,000 Units total) by mouth every 7 (seven) days., Disp: 12 capsule, Rfl: 0  Allergies  Allergen Reactions  . Atorvastatin     Muscle ache  . Pravastatin Itching    dizziness  . Hydrocodone Nausea Only    ROS  Ten systems reviewed and is negative except as mentioned in HPI  Objective  Vitals:   07/12/17 1509  BP: 110/64  Pulse: 96  Resp: 16  Temp: 98.4 F  (36.9 C)  TempSrc: Oral  SpO2: 98%  Weight: 170 lb 1.6 oz (77.2 kg)  Height: 5\' 2"  (1.575 m)    Body mass index is 31.11 kg/m.  Nursing Note and Vital Signs reviewed.  Physical Exam  Constitutional: Patient appears well-developed and well-nourished. Obese No distress.  HEENT: head atraumatic, normocephalic, pupils equal and reactive to light, EOM's intact, Right TM injected and retracted; Left TM WNL. No maxillary or frontal sinus pain on palpation, neck supple with bilateral submandibular lymphadenopathy, oropharynx mildly erythematous and moist without exudate. No jaw tenderness on palpation, no clicks, pops, or dislocation with palpated AROM.  Cardiovascular: Normal rate, regular rhythm, S1/S2 present.  No murmur or rub heard. No BLE edema. Pulmonary/Chest: Effort normal and breath sounds clear but slightly diminished in RUL and LUL. No respiratory distress or retractions. Psychiatric: Patient has a normal mood and affect. behavior is normal. Judgment and thought content normal.  No results found for this or any previous visit (from the past 2160 hour(s)).   Assessment & Plan  1. Bronchitis - budesonide-formoterol (SYMBICORT) 80-4.5 MCG/ACT inhaler; Inhale 2 puffs into the lungs 2 (two) times daily.  Dispense: 1 Inhaler; Refill: 3 - benzonatate (TESSALON PERLES) 100 MG capsule; Take 1-2 capsules (100-200 mg total) by mouth 3 (three) times daily as needed for cough.  Dispense: 30 capsule; Refill: 0 - azithromycin (ZITHROMAX) 250 MG tablet; Day1: 2 Tablets, Days 2-5: 1 Tablet daily.  Dispense: 6 tablet; Refill: 0 - Rinse your mouth and gargle after using Symbicort Inhaler. - May continue to take ibuprofen as needed for pain.  Please be cautious of your Acetaminophen (Tylenol - Maximum 3000mg /day) and Ibuprofen (Advil/Motrin - Maximum 2400mg /day) use each day. - Consider cool mist humidifier.  2. Cough - benzonatate (TESSALON PERLES) 100 MG capsule; Take 1-2 capsules (100-200 mg  total) by mouth 3 (three) times daily as needed for cough.  Dispense: 30 capsule; Refill: 0  3. Right ear pain Ibuprofen PRN, Warm compresses PRN. Continue mucinex to help decongest. Pt uses azelastine Nasal spray, does not want to use steroidal nasal spray because this has caused sores in her nares in the past.  -Red flags and when to present for emergency care or RTC including fever >101.62F, chest pain/shortness of breath not relieved after cough, new/worsening/un-resolving symptoms, reviewed with patient at time of visit. Follow up and care instructions discussed and provided in AVS.

## 2017-07-12 NOTE — Patient Instructions (Signed)
Rinse your mouth and gargle after using Symbicort Inhaler.  You may continue to take ibuprofen as needed for pain.  Please be cautious of your Acetaminophen (Tylenol - Maximum 3000mg /day) and Ibuprofen (Advil/Motrin - Maximum 2400mg /day) use each day.  Consider cool mist humidifier.

## 2017-08-25 ENCOUNTER — Encounter: Payer: Self-pay | Admitting: Family Medicine

## 2017-08-25 ENCOUNTER — Ambulatory Visit: Payer: Federal, State, Local not specified - PPO | Admitting: Family Medicine

## 2017-08-25 VITALS — BP 96/78 | HR 77 | Resp 14 | Ht 62.0 in | Wt 164.1 lb

## 2017-08-25 DIAGNOSIS — E538 Deficiency of other specified B group vitamins: Secondary | ICD-10-CM

## 2017-08-25 DIAGNOSIS — R739 Hyperglycemia, unspecified: Secondary | ICD-10-CM | POA: Diagnosis not present

## 2017-08-25 DIAGNOSIS — K219 Gastro-esophageal reflux disease without esophagitis: Secondary | ICD-10-CM | POA: Diagnosis not present

## 2017-08-25 DIAGNOSIS — E559 Vitamin D deficiency, unspecified: Secondary | ICD-10-CM | POA: Diagnosis not present

## 2017-08-25 DIAGNOSIS — N393 Stress incontinence (female) (male): Secondary | ICD-10-CM

## 2017-08-25 DIAGNOSIS — H8110 Benign paroxysmal vertigo, unspecified ear: Secondary | ICD-10-CM

## 2017-08-25 DIAGNOSIS — N951 Menopausal and female climacteric states: Secondary | ICD-10-CM

## 2017-08-25 DIAGNOSIS — E785 Hyperlipidemia, unspecified: Secondary | ICD-10-CM

## 2017-08-25 DIAGNOSIS — Z9181 History of falling: Secondary | ICD-10-CM

## 2017-08-25 MED ORDER — ROSUVASTATIN CALCIUM 5 MG PO TABS: 5.0000 mg | ORAL_TABLET | Freq: Every day | ORAL | 3 refills | Status: DC

## 2017-08-25 MED ORDER — VENLAFAXINE HCL 37.5 MG PO TABS: 37.5000 mg | ORAL_TABLET | Freq: Every day | ORAL | 3 refills | Status: DC

## 2017-08-25 MED ORDER — METHYLCOBALAMIN 1 MG PO CHEW: 1.0000 | CHEWABLE_TABLET | Freq: Every day | ORAL | 0 refills | Status: DC

## 2017-08-25 MED ORDER — VITAMIN D 50 MCG (2000 UT) PO CAPS: 1.0000 | ORAL_CAPSULE | Freq: Every day | ORAL | 0 refills | Status: DC

## 2017-08-25 MED ORDER — OMEPRAZOLE 20 MG PO CPDR: 20.0000 mg | DELAYED_RELEASE_CAPSULE | Freq: Every day | ORAL | 1 refills | Status: DC

## 2017-08-25 NOTE — Progress Notes (Signed)
Name: Sara Randolph   MRN: [id number]    DOB: 08-31-1960   Date:08/25/2017       Progress Note  Subjective  Chief Complaint  Chief Complaint  Patient presents with  . Hyperlipidemia  . Gastroesophageal Reflux  . Fall    HPI  RECENT FALLS: she tripped on her cat 10 days ago, she was in her bedroom, and hit her right forehead on her hardwood floor, she did not lose consciousness, called her husband that was outside with their 59 yo grandson. She did not go to [facility], initially just mild swelling, but that evening it was a goose egg and a few days ago started to noticed bruise on top of right eyelid and descended to below her right eye. She denies any dizziness, weakness, problems with her vision   STRESS INCONTINENCE: she has noticed that when she sneezes she loses urine, she states she is trying to use bathroom more often and no recent episodes, no urgency or dysuria noticed. Discussed Kegel exercise   MENOPAUSAL SYMPTOMS: She only has hot flashes, but improved with Effexor, only triggered by alcohol intake or when she eats more sweets. She states that Effexor ER caused drowsiness but is doing well on plain Effexor, and is on lower dose of 37.5 mg, she is taking it at night, when she tries to wean self off she has nightmares. She states Effexor also helps with vertigo, advised her not to stop medication  GERD: she has been taking Omeprazole for many years, and states since Fall 2017 she tried weaning self off, but started to have symptoms even when drinking water, she is back on Omeprazole 20 mg daily, she denies heart burn or regurgitation at this time. She will try to lose more weight and she will try to wean self off again.   HYPERLIPIDEMIA: she was on Atorvastatin but stopped and LDL went up to 198, we re-started her on Crestor 6 months ago, we will recheck labs.   FEVER BLISTER: intermittent symptoms, had a lot of flares in Dec, but usually more frequent in Summer  METABOLIC  SYNDROME: last [id number] 5.5% she denies polyphagia, polydipsia or polyuria. She started a life style modification 2 weeks ago  VERTIGO: she has BBPV seen ENT, and is doing well now. She states when she tries to stop Effexor the dizziness returns.    Patient Active Problem List   Diagnosis Date Noted  . Vitamin D deficiency 02/24/2017  . BPPV (benign paroxysmal positional vertigo), unspecified laterality 08/25/2016  . Peripheral vertigo 01/13/2015  . Dyslipidemia 01/13/2015  . Cold sore 01/13/2015  . Gastric reflux 01/13/2015  . Perennial allergic rhinitis 01/13/2015  . Menopausal symptom 01/13/2015  . History of concussion 01/18/2013    Past Surgical History:  Procedure Laterality Date  . ABDOMINAL HYSTERECTOMY  1988  . CHOLECYSTECTOMY    . DILATION AND CURETTAGE OF UTERUS    . ECTOPIC PREGNANCY SURGERY    . RHINOPLASTY  1990  . SALPINGECTOMY    . TONSILLECTOMY  1969  . WRIST SURGERY  1983    Family History  Problem Relation Age of Onset  . Alzheimer's disease Mother   . Parkinsonism Father   . Heart disease Brother   . Heart attack Brother   . Depression Sister   . Osteoporosis Sister     Social History   Socioeconomic History  . Marital status: Married    Spouse name: Buddy  . Number of children: 2  . Years of education:  Not on file  . Highest education level: Not on file  Social Needs  . Financial resource strain: Not on file  . Food insecurity - worry: Not on file  . Food insecurity - inability: Not on file  . Transportation needs - medical: Not on file  . Transportation needs - non-medical: Not on file  Occupational History  . Occupation: Postmaster  Tobacco Use  . Smoking status: Never Smoker  . Smokeless tobacco: Never Used  Substance and Sexual Activity  . Alcohol use: Yes    Alcohol/week: 0.0 oz    Comment: Consumes alcohol on Saturday  . Drug use: No  . Sexual activity: Yes    Partners: Male  Other Topics Concern  . Not on file  Social  History Narrative  . Not on file     Current Outpatient Medications:  .  acetaminophen (TYLENOL) 500 MG tablet, Take 1 tablet by mouth daily., Disp: , Rfl:  .  aspirin 81 MG tablet, Take 1 tablet by mouth daily., Disp: , Rfl:  .  azelastine (ASTELIN) 0.1 % nasal spray, Place 2 sprays into both nostrils every evening. Use in each nostril as directed, Disp: 90 mL, Rfl: 1 .  cetirizine (ZYRTEC) 10 MG tablet, Take 10 mg by mouth daily., Disp: , Rfl:  .  Coenzyme Q-10 100 MG capsule, Take 1 capsule by mouth daily., Disp: , Rfl:  .  Cyanocobalamin (B-12-SL) 1000 MCG SUBL, Place 1 tablet (1,000 mcg total) under the tongue daily., Disp: 30 tablet, Rfl: 0 .  Ginger 500 MG CAPS, Take 2 capsules by mouth daily., Disp: , Rfl:  .  omeprazole (PRILOSEC) 20 MG capsule, Take 1 capsule (20 mg total) by mouth daily., Disp: 90 capsule, Rfl: 1 .  rosuvastatin (CRESTOR) 5 MG tablet, Take 1 tablet (5 mg total) by mouth daily., Disp: 90 tablet, Rfl: 3 .  valACYclovir (VALTREX) 1000 MG tablet, TAKE 1 TABLET BY MOUTH TWICE DAILY, Disp: 30 tablet, Rfl: 0 .  venlafaxine (EFFEXOR) 37.5 MG tablet, Take 1 tablet (37.5 mg total) by mouth daily., Disp: 90 tablet, Rfl: 3  Allergies  Allergen Reactions  . Atorvastatin     Muscle ache  . Pravastatin Itching    dizziness  . Hydrocodone Nausea Only     ROS  Constitutional: Negative for fever, positive for  weight change.  Respiratory: Negative for cough and shortness of breath.   Cardiovascular: Negative for chest pain or palpitations.  Gastrointestinal: Negative for abdominal pain, no bowel changes.  Musculoskeletal: Negative for gait problem or joint swelling.  Skin: Negative for rash.  Neurological: Negative for dizziness or headache.  No other specific complaints in a complete review of systems (except as listed in HPI above).  Objective  Vitals:   08/25/17 0754  BP: 96/78  Pulse: 77  Resp: 14  SpO2: 98%  Weight: 164 lb 1.6 oz (74.4 kg)  Height: 5\' 2"   (1.575 m)    Body mass index is 30.01 kg/m.  Physical Exam  Constitutional: Patient appears well-developed and well-nourished. Obese No distress.  HEENT: head atraumatic, normocephalic, pupils equal and reactive to light,  neck supple, throat within normal limits, she has tender spot on right frontal area ( Area of impact), with bruising going down to right eye and right check.  Cardiovascular: Normal rate, regular rhythm and normal heart sounds.  No murmur heard. No BLE edema. Pulmonary/Chest: Effort normal and breath sounds normal. No respiratory distress. Abdominal: Soft.  There is no tenderness. Psychiatric: Patient  has a normal mood and affect. behavior is normal. Judgment and thought content normal.  PHQ2/9: Depression screen [facility] 2/9 07/12/2017 02/22/2017 08/25/2016 11/06/2015 08/07/2015  Decreased Interest 0 0 0 0 0  Down, Depressed, Hopeless 0 0 0 0 0  PHQ - 2 Score 0 0 0 0 0     Fall Risk: Fall Risk  08/25/2017 07/12/2017 02/22/2017 08/25/2016 11/06/2015  Falls in the past year? Yes No No No No  Number falls in past yr: 1 - - - -  Injury with Fall? Yes - - - -  Follow up Education provided - - - -    Functional Status Survey: Is the patient deaf or have difficulty hearing?: No Does the patient have difficulty seeing, even when wearing glasses/contacts?: No Does the patient have difficulty concentrating, remembering, or making decisions?: No Does the patient have difficulty walking or climbing stairs?: No Does the patient have difficulty dressing or bathing?: No Does the patient have difficulty doing errands alone such as visiting a doctor's office or shopping?: No   Assessment & Plan  1. Menopausal symptom  - venlafaxine (EFFEXOR) 37.5 MG tablet; Take 1 tablet (37.5 mg total) by mouth daily.  Dispense: 90 tablet; Refill: 3  2. Dyslipidemia  - rosuvastatin (CRESTOR) 5 MG tablet; Take 1 tablet (5 mg total) by mouth daily.  Dispense: 90 tablet; Refill: 3 - COMPLETE  METABOLIC PANEL WITH GFR - Lipid panel  3. Gastric reflux  - omeprazole (PRILOSEC) 20 MG capsule; Take 1 capsule (20 mg total) by mouth daily.  Dispense: 90 capsule; Refill: 1  4. Hyperglycemia  - Hemoglobin A1c  5. B12 deficiency  - Vitamin B12 - Methylcobalamin (B12-ACTIVE) 1 MG CHEW; Chew 1 tablet by mouth daily.  Dispense: 30 tablet; Refill: 0  6. Vitamin D deficiency  - VITAMIN D 25 Hydroxy (Vit-D Deficiency, Fractures) - Cholecalciferol (VITAMIN D) 2000 units CAPS; Take 1 capsule (2,000 Units total) by mouth daily.  Dispense: 30 capsule; Refill: 0  7. BPPV (benign paroxysmal positional vertigo), unspecified laterality  Doing well on Effexor   8. Stress incontinence in female  She is stable  9. History of recent fall  Discussed fall prevention

## 2017-08-26 ENCOUNTER — Other Ambulatory Visit: Payer: Self-pay | Admitting: Family Medicine

## 2017-08-26 DIAGNOSIS — E559 Vitamin D deficiency, unspecified: Secondary | ICD-10-CM

## 2017-08-26 LAB — COMPLETE METABOLIC PANEL WITH GFR
AG Ratio: 1.6 (calc) (ref 1.0–2.5)
ALT: 11 U/L (ref 6–29)
AST: 14 U/L (ref 10–35)
Albumin: 4.7 g/dL (ref 3.6–5.1)
Alkaline phosphatase (APISO): 63 U/L (ref 33–130)
BUN: 17 mg/dL (ref 7–25)
CO2: 30 mmol/L (ref 20–32)
Calcium: 10 mg/dL (ref 8.6–10.4)
Chloride: 102 mmol/L (ref 98–110)
Creat: 0.92 mg/dL (ref 0.50–1.05)
GFR, Est African American: 80 mL/min/{1.73_m2} (ref >=60)
GFR, Est Non African American: 69 mL/min/{1.73_m2} (ref >=60)
Globulin: 2.9 g/dL (calc) (ref 1.9–3.7)
Glucose, Bld: 106 mg/dL — ABNORMAL HIGH (ref 65–99)
Potassium: 4.6 mmol/L (ref 3.5–5.3)
Sodium: 140 mmol/L (ref 135–146)
Total Bilirubin: 0.6 mg/dL (ref 0.2–1.2)
Total Protein: 7.6 g/dL (ref 6.1–8.1)

## 2017-08-26 LAB — LIPID PANEL
Cholesterol: 166 mg/dL (ref <200)
HDL: 51 mg/dL (ref >50)
LDL Cholesterol (Calc): 92 mg/dL (calc)
Non-HDL Cholesterol (Calc): 115 mg/dL (calc) (ref <130)
Total CHOL/HDL Ratio: 3.3 (calc) (ref <5.0)
Triglycerides: 136 mg/dL (ref <150)

## 2017-08-26 LAB — HEMOGLOBIN A1C
Hgb A1c MFr Bld: 5.7 % of total Hgb — ABNORMAL HIGH (ref <5.7)
Mean Plasma Glucose: 117 (calc)
eAG (mmol/L): 6.5 (calc)

## 2017-08-26 LAB — VITAMIN D 25 HYDROXY (VIT D DEFICIENCY, FRACTURES): Vit D, 25-Hydroxy: 20 ng/mL — ABNORMAL LOW (ref 30–100)

## 2017-08-26 LAB — VITAMIN B12: Vitamin B-12: 678 pg/mL (ref 200–1100)

## 2017-08-26 MED ORDER — VITAMIN D (ERGOCALCIFEROL) 1.25 MG (50000 UNIT) PO CAPS: 50000.0000 [IU] | ORAL_CAPSULE | ORAL | 0 refills | Status: DC

## 2017-09-27 DIAGNOSIS — K080 Exfoliation of teeth due to systemic causes: Secondary | ICD-10-CM | POA: Diagnosis not present

## 2017-11-28 ENCOUNTER — Other Ambulatory Visit: Payer: Self-pay | Admitting: Family Medicine

## 2017-11-28 DIAGNOSIS — Z1231 Encounter for screening mammogram for malignant neoplasm of breast: Secondary | ICD-10-CM

## 2017-12-22 ENCOUNTER — Ambulatory Visit
Admission: RE | Admit: 2017-12-22 | Discharge: 2017-12-22 | Disposition: A | Discharge status: Home / Self Care | Payer: Federal, State, Local not specified - PPO | Source: Ambulatory Visit | Attending: Family Medicine | Admitting: Family Medicine

## 2017-12-22 DIAGNOSIS — Z1231 Encounter for screening mammogram for malignant neoplasm of breast: Secondary | ICD-10-CM | POA: Insufficient documentation

## 2018-01-11 ENCOUNTER — Other Ambulatory Visit: Payer: Self-pay | Admitting: Family Medicine

## 2018-01-11 ENCOUNTER — Telehealth: Payer: Self-pay

## 2018-01-11 DIAGNOSIS — E669 Obesity, unspecified: Secondary | ICD-10-CM

## 2018-01-11 NOTE — Telephone Encounter (Signed)
Copied from [place] [id number]. Topic: Referral - Request >> Jan 11, 2018 11:37 AM Mylinda Latina, NT wrote: Reason for CRM: patient states she would like to be referred to an Endocrinologist due to weight gain. The Doctor she is requesting to see is Dr. Randa Spike w/ [facility]. If you have any questions please call patient [phone]

## 2018-02-28 ENCOUNTER — Encounter: Payer: Self-pay | Admitting: Family Medicine

## 2018-02-28 ENCOUNTER — Ambulatory Visit (INDEPENDENT_AMBULATORY_CARE_PROVIDER_SITE_OTHER): Payer: Federal, State, Local not specified - PPO | Admitting: Family Medicine

## 2018-02-28 VITALS — BP 100/68 | HR 65 | Temp 97.9°F | Resp 16 | Ht 62.0 in | Wt 163.6 lb

## 2018-02-28 DIAGNOSIS — K219 Gastro-esophageal reflux disease without esophagitis: Secondary | ICD-10-CM | POA: Diagnosis not present

## 2018-02-28 DIAGNOSIS — E785 Hyperlipidemia, unspecified: Secondary | ICD-10-CM

## 2018-02-28 DIAGNOSIS — Z01419 Encounter for gynecological examination (general) (routine) without abnormal findings: Secondary | ICD-10-CM | POA: Diagnosis not present

## 2018-02-28 DIAGNOSIS — E559 Vitamin D deficiency, unspecified: Secondary | ICD-10-CM | POA: Diagnosis not present

## 2018-02-28 DIAGNOSIS — E538 Deficiency of other specified B group vitamins: Secondary | ICD-10-CM | POA: Diagnosis not present

## 2018-02-28 DIAGNOSIS — H8110 Benign paroxysmal vertigo, unspecified ear: Secondary | ICD-10-CM | POA: Diagnosis not present

## 2018-02-28 DIAGNOSIS — R739 Hyperglycemia, unspecified: Secondary | ICD-10-CM | POA: Diagnosis not present

## 2018-02-28 DIAGNOSIS — Z79899 Other long term (current) drug therapy: Secondary | ICD-10-CM

## 2018-02-28 MED ORDER — OMEPRAZOLE 20 MG PO CPDR: 20.0000 mg | DELAYED_RELEASE_CAPSULE | Freq: Every day | ORAL | 1 refills | Status: DC

## 2018-02-28 NOTE — Progress Notes (Signed)
Name: Sara Randolph   MRN: [id number]    DOB: 06/25/1961   Date:02/28/2018       Progress Note  Subjective  Chief Complaint  Chief Complaint  Patient presents with  . Annual Exam    patient sleeps about 6-8 hrs. per night. tries to eat a well balanced diet  . Labs Only    fasting     HPI   Patient presents for annual CPE and follow up.  Diet: trying to avoid sugar, lean protein  Exercise: she has been active outside / working around her house, but no structural activity   B12 and Vitamin D deficiency: taking supplements and is doing well, we will recheck labs.   Overweight: she was obese but has changed her diet and is now overweight.   Hyperlipidemia: taking Crestor and denies side effects, last lipid panel showed significant improvement and would like to have repeat labs. ASCVD 1.7% and we will not start aspirin at this time  Pre-diabetes: she is on a low sugar diet, denies polyphagia, polydipsia or polyuria  Dizziness: she states noticed some dizziness when getting up quickly, no syncope. She also had a severe episode of vertigo one week ago associated with nausea, she has follow up with ENT in two days.   GERD: she is on Omeprazole daily again, she states since she is cutting down on sugar and eating healthier symptoms have improved, she will try to wean self off again, try to alternate with ranitidine.   USPSTF grade A and B recommendations    Office Visit from 02/28/2018 in [facility]  AUDIT-C Score  2     Depression:  Depression screen [facility] 2/9 02/28/2018 07/12/2017 02/22/2017 08/25/2016 11/06/2015  Decreased Interest 0 0 0 0 0  Down, Depressed, Hopeless 0 0 0 0 0  PHQ - 2 Score 0 0 0 0 0  Altered sleeping 0 - - - -  Tired, decreased energy 0 - - - -  Change in appetite 0 - - - -  Feeling bad or failure about yourself  0 - - - -  Trouble concentrating 0 - - - -  Moving slowly or fidgety/restless 0 - - - -  PHQ-9 Score 0 - - - -  Difficult  doing work/chores Not difficult at all - - - -   Hypertension: BP Readings from Last 3 Encounters:  02/28/18 100/68  08/25/17 96/78  07/12/17 110/64   Obesity: Wt Readings from Last 3 Encounters:  02/28/18 163 lb 9.6 oz (74.2 kg)  08/25/17 164 lb 1.6 oz (74.4 kg)  07/12/17 170 lb 1.6 oz (77.2 kg)   BMI Readings from Last 3 Encounters:  02/28/18 29.92 kg/m  08/25/17 30.01 kg/m  07/12/17 31.11 kg/m    Hep C Screening: done STD testing and prevention (HIV/chl/gon/syphilis): N/A Intimate partner violence: negative screen  Sexual History/Pain during Intercourse: she has episodes of vaginal dryness  Menstrual History/LMP/Abnormal Bleeding: hysterectomy  Incontinence Symptoms: she has some stress incontinence but not frequently   Advanced Care Planning: A voluntary discussion about advance care planning including the explanation and discussion of advance directives.  Discussed health care proxy and Living will, and the patient was able to identify a health care proxy as oldest daughter .  Patient does not have a living will at present time.   Breast cancer:  HM Mammogram  Date Value Ref Range Status  06/18/2014 normal  Final    BRCA gene screening: N/A Cervical cancer screening: not  a candidate   Osteoporosis Screening: up to date - last one normal in 2016   Lipids:  Lab Results  Component Value Date   CHOL 166 08/25/2017   CHOL 282 (H) 02/22/2017   CHOL 170 09/24/2016   Lab Results  Component Value Date   HDL 51 08/25/2017   HDL 48 (L) 02/22/2017   HDL 44 (L) 09/24/2016   Lab Results  Component Value Date   LDLCALC 92 08/25/2017   LDLCALC 198 (H) 02/22/2017   LDLCALC 89 09/24/2016   Lab Results  Component Value Date   TRIG 136 08/25/2017   TRIG 181 (H) 02/22/2017   TRIG 183 (H) 09/24/2016   Lab Results  Component Value Date   CHOLHDL 3.3 08/25/2017   CHOLHDL 5.9 (H) 02/22/2017   CHOLHDL 3.9 09/24/2016   No results found for: LDLDIRECT  Glucose:   Glucose, Bld  Date Value Ref Range Status  08/25/2017 106 (H) 65 - 99 mg/dL Final    Comment:    .            Fasting reference interval . For someone without known diabetes, a glucose value between 100 and 125 mg/dL is consistent with prediabetes and should be confirmed with a follow-up test. .   02/22/2017 104 (H) 65 - 99 mg/dL Final  09/24/2016 97 65 - 99 mg/dL Final    Skin cancer: discussed atypical moles  Colorectal cancer: up to date Lung cancer:   Low Dose CT Chest recommended if Age 26-80 years, 30 pack-year currently smoking OR have quit w/in 15years. Patient does not qualify.   ECG: had a normal one in 2011   Patient Active Problem List   Diagnosis Date Noted  . Vitamin D deficiency 02/24/2017  . BPPV (benign paroxysmal positional vertigo), unspecified laterality 08/25/2016  . Peripheral vertigo 01/13/2015  . Dyslipidemia 01/13/2015  . Cold sore 01/13/2015  . Gastric reflux 01/13/2015  . Perennial allergic rhinitis 01/13/2015  . Menopausal symptom 01/13/2015  . History of concussion 01/18/2013    Past Surgical History:  Procedure Laterality Date  . ABDOMINAL HYSTERECTOMY  1988  . CHOLECYSTECTOMY    . DILATION AND CURETTAGE OF UTERUS    . ECTOPIC PREGNANCY SURGERY    . RHINOPLASTY  1990  . SALPINGECTOMY    . TONSILLECTOMY  1969  . WRIST SURGERY  1983    Family History  Problem Relation Age of Onset  . Alzheimer's disease Mother   . Parkinsonism Father   . Heart disease Brother   . Heart attack Brother   . Depression Sister   . Osteoporosis Sister     Social History   Socioeconomic History  . Marital status: Married    Spouse name: Buddy  . Number of children: 2  . Years of education: Not on file  . Highest education level: Not on file  Occupational History  . Occupation: Sales executive  Social Needs  . Financial resource strain: Not hard at all  . Food insecurity:    Worry: Never true    Inability: Never true  . Transportation needs:     Medical: No    Non-medical: No  Tobacco Use  . Smoking status: Never Smoker  . Smokeless tobacco: Never Used  Substance and Sexual Activity  . Alcohol use: Yes    Alcohol/week: 0.0 oz    Comment: Consumes alcohol on Saturday  . Drug use: No  . Sexual activity: Yes    Partners: Male  Lifestyle  . Physical activity:  Days per week: 5 days    Minutes per session: 30 min  . Stress: Only a little  Relationships  . Social connections:    Talks on phone: More than three times a week    Gets together: Once a week    Attends religious service: Never    Active member of club or organization: No    Attends meetings of clubs or organizations: Never    Relationship status: Married  . Intimate partner violence:    Fear of current or ex partner: No    Emotionally abused: No    Physically abused: No    Forced sexual activity: No  Other Topics Concern  . Not on file  Social History Narrative  . Not on file     Current Outpatient Medications:  .  acetaminophen (TYLENOL) 500 MG tablet, Take 1 tablet by mouth daily., Disp: , Rfl:  .  azelastine (ASTELIN) 0.1 % nasal spray, Place 2 sprays into both nostrils every evening. Use in each nostril as directed, Disp: 90 mL, Rfl: 1 .  cetirizine (ZYRTEC) 10 MG tablet, Take 10 mg by mouth daily., Disp: , Rfl:  .  erythromycin ophthalmic ointment, APPLY A ONE FOURTH INCH RIBBON INTO THE LEFT EYE AT BEDTIME AND AS DIRECTED, Disp: , Rfl: 2 .  levocetirizine (XYZAL) 5 MG tablet, Take 2.5 mg by mouth every evening. , Disp: , Rfl:  .  omeprazole (PRILOSEC) 20 MG capsule, Take 1 capsule (20 mg total) by mouth daily., Disp: 90 capsule, Rfl: 1 .  rosuvastatin (CRESTOR) 5 MG tablet, Take 1 tablet (5 mg total) by mouth daily., Disp: 90 tablet, Rfl: 3 .  SYMBICORT 80-4.5 MCG/ACT inhaler, INHALE 2 PUFFS INTO LUNGS TWICE DAILY, Disp: , Rfl: 3 .  valACYclovir (VALTREX) 1000 MG tablet, TAKE 1 TABLET BY MOUTH TWICE DAILY, Disp: 30 tablet, Rfl: 0  Allergies   Allergen Reactions  . Atorvastatin     Muscle ache  . Pravastatin Itching    dizziness  . Hydrocodone Nausea Only     ROS  Constitutional: Negative for fever or weight change.  Respiratory: Negative for cough and shortness of breath.   Cardiovascular: Negative for chest pain or palpitations.  Gastrointestinal: Negative for abdominal pain, no bowel changes.  Musculoskeletal: Negative for gait problem or joint swelling.  Skin: Negative for rash.  Neurological: Negative for dizziness or headache.  No other specific complaints in a complete review of systems (except as listed in HPI above).  Objective  Vitals:   02/28/18 0805  BP: 100/68  Pulse: 65  Resp: 16  Temp: 97.9 F (36.6 C)  TempSrc: Oral  SpO2: 96%  Weight: 163 lb 9.6 oz (74.2 kg)  Height: _0  (1.575 m)    Body mass index is 29.92 kg/m.  Physical Exam   Constitutional: Patient appears well-developed and overweight.  No distress.  HENT: Head: Normocephalic and atraumatic. Ears: B TMs ok, no erythema or effusion; Nose: Nose normal. Mouth/Throat: Oropharynx is clear and moist. No oropharyngeal exudate.  Eyes: Conjunctivae and EOM are normal. Pupils are equal, round, and reactive to light. No scleral icterus.  Neck: Normal range of motion. Neck supple. No JVD present. No thyromegaly present.  Cardiovascular: Normal rate, regular rhythm and normal heart sounds.  No murmur heard. No BLE edema. Pulmonary/Chest: Effort normal and breath sounds normal. No respiratory distress. Abdominal: Soft. Bowel sounds are normal, no distension. There is no tenderness. no masses Breast: no lumps or masses, no nipple discharge or  rashes FEMALE GENITALIA:  Not done RECTAL: not done  Musculoskeletal: Normal range of motion, no joint effusions. No gross deformities Neurological: he is alert and oriented to person, place, and time. No cranial nerve deficit. Coordination, balance, strength, speech and gait are normal.  Skin: Skin is  warm and dry. No rash noted. No erythema.  Psychiatric: Patient has a normal mood and affect. behavior is normal. Judgment and thought content normal.  PHQ2/9: Depression screen [facility] 2/9 02/28/2018 07/12/2017 02/22/2017 08/25/2016 11/06/2015  Decreased Interest 0 0 0 0 0  Down, Depressed, Hopeless 0 0 0 0 0  PHQ - 2 Score 0 0 0 0 0  Altered sleeping 0 - - - -  Tired, decreased energy 0 - - - -  Change in appetite 0 - - - -  Feeling bad or failure about yourself  0 - - - -  Trouble concentrating 0 - - - -  Moving slowly or fidgety/restless 0 - - - -  PHQ-9 Score 0 - - - -  Difficult doing work/chores Not difficult at all - - - -     Fall Risk: Fall Risk  02/28/2018 08/25/2017 07/12/2017 02/22/2017 08/25/2016  Falls in the past year? No Yes No No No  Number falls in past yr: - 1 - - -  Injury with Fall? - Yes - - -  Follow up - Education provided - - -     Functional Status Survey: Is the patient deaf or have difficulty hearing?: No Does the patient have difficulty seeing, even when wearing glasses/contacts?: No Does the patient have difficulty concentrating, remembering, or making decisions?: No Does the patient have difficulty walking or climbing stairs?: No Does the patient have difficulty dressing or bathing?: No Does the patient have difficulty doing errands alone such as visiting a doctor's office or shopping?: No   Assessment & Plan  1. Well woman exam  Discussed importance of 150 minutes of physical activity weekly, eat two servings of fish weekly, eat one serving of tree nuts ( cashews, pistachios, pecans, almonds.Marland Kitchen) every other day, eat 6 servings of fruit/vegetables daily and drink plenty of water and avoid sweet beverages.   2. B12 deficiency  - CBC with Differential/Platelet - Vitamin B12  3. Vitamin D deficiency  - VITAMIN D 25 Hydroxy (Vit-D Deficiency, Fractures)  4. Hyperglycemia  - COMPLETE METABOLIC PANEL WITH GFR - Hemoglobin A1c  5. Dyslipidemia  -  COMPLETE METABOLIC PANEL WITH GFR - Lipid panel   6. BPPV (benign paroxysmal positional vertigo), unspecified laterality  Keep follow up with ENT, doing better than last week   7. Gastric reflux  - omeprazole (PRILOSEC) 20 MG capsule; Take 1 capsule (20 mg total) by mouth daily.  Dispense: 90 capsule; Refill: 1  8. Long-term use of high-risk medication  - COMPLETE METABOLIC PANEL WITH GFR - CBC with Differential/Platelet

## 2018-02-28 NOTE — Patient Instructions (Signed)
Preventive Care 40-64 Years, Female Preventive care refers to lifestyle choices and visits with your health care provider that can promote health and wellness. What does preventive care include?  A yearly physical exam. This is also called an annual well check.  Dental exams once or twice a year.  Routine eye exams. Ask your health care provider how often you should have your eyes checked.  Personal lifestyle choices, including: ? Daily care of your teeth and gums. ? Regular physical activity. ? Eating a healthy diet. ? Avoiding tobacco and drug use. ? Limiting alcohol use. ? Practicing safe sex. ? Taking low-dose aspirin daily starting at age 58. ? Taking vitamin and mineral supplements as recommended by your health care provider. What happens during an annual well check? The services and screenings done by your health care provider during your annual well check will depend on your age, overall health, lifestyle risk factors, and family history of disease. Counseling Your health care provider may ask you questions about your:  Alcohol use.  Tobacco use.  Drug use.  Emotional well-being.  Home and relationship well-being.  Sexual activity.  Eating habits.  Work and work Statistician.  Method of birth control.  Menstrual cycle.  Pregnancy history.  Screening You may have the following tests or measurements:  Height, weight, and BMI.  Blood pressure.  Lipid and cholesterol levels. These may be checked every 5 years, or more frequently if you are over 81 years old.  Skin check.  Lung cancer screening. You may have this screening every year starting at age 78 if you have a 30-pack-year history of smoking and currently smoke or have quit within the past 15 years.  Fecal occult blood test (FOBT) of the stool. You may have this test every year starting at age 65.  Flexible sigmoidoscopy or colonoscopy. You may have a sigmoidoscopy every 5 years or a colonoscopy  every 10 years starting at age 30.  Hepatitis C blood test.  Hepatitis B blood test.  Sexually transmitted disease (STD) testing.  Diabetes screening. This is done by checking your blood sugar (glucose) after you have not eaten for a while (fasting). You may have this done every 1-3 years.  Mammogram. This may be done every 1-2 years. Talk to your health care provider about when you should start having regular mammograms. This may depend on whether you have a family history of breast cancer.  BRCA-related cancer screening. This may be done if you have a family history of breast, ovarian, tubal, or peritoneal cancers.  Pelvic exam and Pap test. This may be done every 3 years starting at age 80. Starting at age 36, this may be done every 5 years if you have a Pap test in combination with an HPV test.  Bone density scan. This is done to screen for osteoporosis. You may have this scan if you are at high risk for osteoporosis.  Discuss your test results, treatment options, and if necessary, the need for more tests with your health care provider. Vaccines Your health care provider may recommend certain vaccines, such as:  Influenza vaccine. This is recommended every year.  Tetanus, diphtheria, and acellular pertussis (Tdap, Td) vaccine. You may need a Td booster every 10 years.  Varicella vaccine. You may need this if you have not been vaccinated.  Zoster vaccine. You may need this after age 5.  Measles, mumps, and rubella (MMR) vaccine. You may need at least one dose of MMR if you were born in  1957 or later. You may also need a second dose.  Pneumococcal 13-valent conjugate (PCV13) vaccine. You may need this if you have certain conditions and were not previously vaccinated.  Pneumococcal polysaccharide (PPSV23) vaccine. You may need one or two doses if you smoke cigarettes or if you have certain conditions.  Meningococcal vaccine. You may need this if you have certain  conditions.  Hepatitis A vaccine. You may need this if you have certain conditions or if you travel or work in places where you may be exposed to hepatitis A.  Hepatitis B vaccine. You may need this if you have certain conditions or if you travel or work in places where you may be exposed to hepatitis B.  Haemophilus influenzae type b (Hib) vaccine. You may need this if you have certain conditions.  Talk to your health care provider about which screenings and vaccines you need and how often you need them. This information is not intended to replace advice given to you by your health care provider. Make sure you discuss any questions you have with your health care provider. Document Released: 08/15/2015 Document Revised: 04/07/2016 Document Reviewed: 05/20/2015 Elsevier Interactive Patient Education  2018 Elsevier Inc.  

## 2018-03-01 LAB — CBC WITH DIFFERENTIAL/PLATELET
Basophils Absolute: 32 cells/uL (ref 0–200)
Basophils Relative: 0.6 %
Eosinophils Absolute: 80 cells/uL (ref 15–500)
Eosinophils Relative: 1.5 %
HCT: 42.5 % (ref 35.0–45.0)
Hemoglobin: 14.1 g/dL (ref 11.7–15.5)
Lymphs Abs: 1579 cells/uL (ref 850–3900)
MCH: 28.2 pg (ref 27.0–33.0)
MCHC: 33.2 g/dL (ref 32.0–36.0)
MCV: 85 fL (ref 80.0–100.0)
MPV: 10.9 fL (ref 7.5–12.5)
Monocytes Relative: 5.7 %
Neutro Abs: 3307 cells/uL (ref 1500–7800)
Neutrophils Relative %: 62.4 %
Platelets: 304 10*3/uL (ref 140–400)
RBC: 5 10*6/uL (ref 3.80–5.10)
RDW: 12.5 % (ref 11.0–15.0)
Total Lymphocyte: 29.8 %
WBC mixed population: 302 cells/uL (ref 200–950)
WBC: 5.3 10*3/uL (ref 3.8–10.8)

## 2018-03-01 LAB — COMPLETE METABOLIC PANEL WITH GFR
AG Ratio: 1.8 (calc) (ref 1.0–2.5)
ALT: 11 U/L (ref 6–29)
AST: 14 U/L (ref 10–35)
Albumin: 4.5 g/dL (ref 3.6–5.1)
Alkaline phosphatase (APISO): 59 U/L (ref 33–130)
BUN: 12 mg/dL (ref 7–25)
CO2: 27 mmol/L (ref 20–32)
Calcium: 9.4 mg/dL (ref 8.6–10.4)
Chloride: 103 mmol/L (ref 98–110)
Creat: 0.77 mg/dL (ref 0.50–1.05)
GFR, Est African American: 98 mL/min/{1.73_m2} (ref >=60)
GFR, Est Non African American: 85 mL/min/{1.73_m2} (ref >=60)
Globulin: 2.5 g/dL (calc) (ref 1.9–3.7)
Glucose, Bld: 104 mg/dL — ABNORMAL HIGH (ref 65–99)
Potassium: 4 mmol/L (ref 3.5–5.3)
Sodium: 139 mmol/L (ref 135–146)
Total Bilirubin: 0.8 mg/dL (ref 0.2–1.2)
Total Protein: 7 g/dL (ref 6.1–8.1)

## 2018-03-01 LAB — LIPID PANEL
Cholesterol: 156 mg/dL (ref <200)
HDL: 46 mg/dL — ABNORMAL LOW (ref >50)
LDL Cholesterol (Calc): 85 mg/dL (calc)
Non-HDL Cholesterol (Calc): 110 mg/dL (calc) (ref <130)
Total CHOL/HDL Ratio: 3.4 (calc) (ref <5.0)
Triglycerides: 146 mg/dL (ref <150)

## 2018-03-01 LAB — VITAMIN B12: Vitamin B-12: 478 pg/mL (ref 200–1100)

## 2018-03-01 LAB — HEMOGLOBIN A1C
Hgb A1c MFr Bld: 5.7 % of total Hgb — ABNORMAL HIGH (ref <5.7)
Mean Plasma Glucose: 117 (calc)
eAG (mmol/L): 6.5 (calc)

## 2018-03-01 LAB — VITAMIN D 25 HYDROXY (VIT D DEFICIENCY, FRACTURES): Vit D, 25-Hydroxy: 20 ng/mL — ABNORMAL LOW (ref 30–100)

## 2018-03-02 DIAGNOSIS — H8111 Benign paroxysmal vertigo, right ear: Secondary | ICD-10-CM | POA: Diagnosis not present

## 2018-03-07 ENCOUNTER — Other Ambulatory Visit: Payer: Self-pay | Admitting: Family Medicine

## 2018-03-07 NOTE — Telephone Encounter (Signed)
Refill request for general medication. Vitamin D   Last office visit: 02/28/18  Follow up on 08/31/2018

## 2018-03-30 DIAGNOSIS — K080 Exfoliation of teeth due to systemic causes: Secondary | ICD-10-CM | POA: Diagnosis not present

## 2018-04-06 DIAGNOSIS — Z8349 Family history of other endocrine, nutritional and metabolic diseases: Secondary | ICD-10-CM | POA: Diagnosis not present

## 2018-04-06 DIAGNOSIS — E663 Overweight: Secondary | ICD-10-CM | POA: Diagnosis not present

## 2018-04-06 DIAGNOSIS — R7303 Prediabetes: Secondary | ICD-10-CM | POA: Diagnosis not present

## 2018-04-28 ENCOUNTER — Encounter: Payer: Self-pay | Admitting: Dietician

## 2018-04-28 ENCOUNTER — Encounter: Payer: Federal, State, Local not specified - PPO | Attending: Family Medicine | Admitting: Dietician

## 2018-04-28 VITALS — Ht 62.0 in | Wt 156.8 lb

## 2018-04-28 DIAGNOSIS — E663 Overweight: Secondary | ICD-10-CM | POA: Diagnosis not present

## 2018-04-28 DIAGNOSIS — R7303 Prediabetes: Secondary | ICD-10-CM | POA: Diagnosis not present

## 2018-04-28 NOTE — Patient Instructions (Signed)
   Gradually add some healthy carbs such as fruits, beans, whole grains. Try adding 8-15 grams daily every few weeks until reaching 30-45 grams with each meal.  Concentrate on healthy fats to control cholesterol -- vegetable oils, nuts, seeds, avocado.   Control portions of proteins and fats to balance calorie intake.   Continue to aim for about 1200 calories daily, and keep up daily activity! Great job!

## 2018-04-28 NOTE — Progress Notes (Signed)
Medical Nutrition Therapy: Visit start time: 0900  end time: 1030  Assessment:  Diagnosis: per-diabetes Past medical history: hyperlipidemia, GERD Psychosocial issues/ stress concerns: none  Preferred learning method:  . Auditory . Visual . Hands-on   Current weight: 156.8lbs Height: 5'2" Medications, supplements: reconciled list in medical record  Progress and evaluation: Patient reports increase in BGs recently -- 123 04/06/18, was about 110 several months ago. She has significantly reduced sugar and starch  Intake by following a modified keto diet, and has lost about 16lbs. She has limited carb intake to 15grams daily or less. Both daughters have been diagnosed with celiac disease several years ago, and patient reports family history of diabetes and hypothyroidism. She is concerned with preventing diabetes and any other related health issues.     Physical activity: tracking daily steps, averaging 7500-10,000 steps daily; keeps young grandchildren 3x a week  Dietary Intake:  Usual eating pattern includes 2-3 meals and 0-1 snacks per day. Dining out frequency: 1-2 meals per week.  Breakfast: premier protein drink; gluten free/ keto scones -- 8g cho; keto bat slimfast Snack: none Lunch: keto bar; sometimes skips; occ sandwich Snack: none unless very hungry Supper: protein-- hamburger, pork chops, chicken, steak, hot dogs-- avoiding breads, includes vegetables-- salad, green beans, corn Snack: none Beverages: water, diet soda; uses Swerve sweetener (erythritol) to sweeten drinks, foods.   Nutrition Care Education: Topics covered: diabetes prevention, weight control Basic nutrition: appropriate nutrient balance, general nutrition guidelines    Weight control: calculated patient's daily energy needs at 1200kcal for weight loss to her goal of 135-145lbs; discussed role of exercise, tracking intake, appropriate food portions. Discussed keto diet and importance of including all food groups,  high fiber foods, and sustainable eating pattern; encouraged Mediterranean diet pattern for blood sugar and cholesterol control. Discussed intermittent fasting per patient question.  Diabetes prevention: appropriate meal and snack schedule, Carb counting, appropriate carb intake and balance; basic meal planning to meet minimum daily carb needs, or energy breakdown of 40% CHO, 30% protein, and 30% fat; discussed healthy carb choices.  Hyperlipidemia: healthy and unhealthy fats as related to keto diet, role of fiber    Nutritional Diagnosis:  [place]-2.2 Altered nutrition-related laboratory As related to pre-diabetes.  As evidenced by patient with recent HbA1C of 5.9%. [place]-3.3 Overweight/obesity As related to history of inactivity and decreased metabolic rate due to menopause.  As evidenced by patient with current BMI of 28 after recent weight loss of 16lbs.  Intervention: Instruction as noted above.   Patient will plan to gradually increase intake of whole grains, beans, and fruits to meet her basic nutrient needs, while maintaining calorie level of 1200kcal for weight loss.    Set nutrition goals with input from patient.    No follow-up scheduled at this time; patient will schedule later if needed.   Education Materials given:  . General diet guidelines for Diabetes . Plate Planner with food lists . Sample menus . Mediterranean Diet ([place]) . Goals/ instructions   Learner/ who was taught:  . Patient  . Spouse/ partner   Level of understanding: Marland Kitchen Verbalizes/ demonstrates competency  Demonstrated degree of understanding via:   Teach back Learning barriers: . None  Willingness to learn/ readiness for change: . Eager, change in progress  Monitoring and Evaluation:  Dietary intake, exercise, BG control, and body weight      follow up: prn

## 2018-05-06 DIAGNOSIS — J069 Acute upper respiratory infection, unspecified: Secondary | ICD-10-CM | POA: Diagnosis not present

## 2018-05-06 DIAGNOSIS — J029 Acute pharyngitis, unspecified: Secondary | ICD-10-CM | POA: Diagnosis not present

## 2018-06-05 ENCOUNTER — Other Ambulatory Visit: Payer: Self-pay | Admitting: Family Medicine

## 2018-06-20 ENCOUNTER — Other Ambulatory Visit: Payer: Self-pay | Admitting: Family Medicine

## 2018-06-20 DIAGNOSIS — B001 Herpesviral vesicular dermatitis: Secondary | ICD-10-CM

## 2018-08-21 IMAGING — MG MM DIGITAL SCREENING BILAT W/ TOMO W/ CAD
8 series · 8 of 24 positions shown · non-contrast
Comparison: Previous exam(s).

CLINICAL DATA: Screening.

EXAM:
DIGITAL SCREENING BILATERAL MAMMOGRAM WITH TOMO AND CAD

[L CC synth-2D]
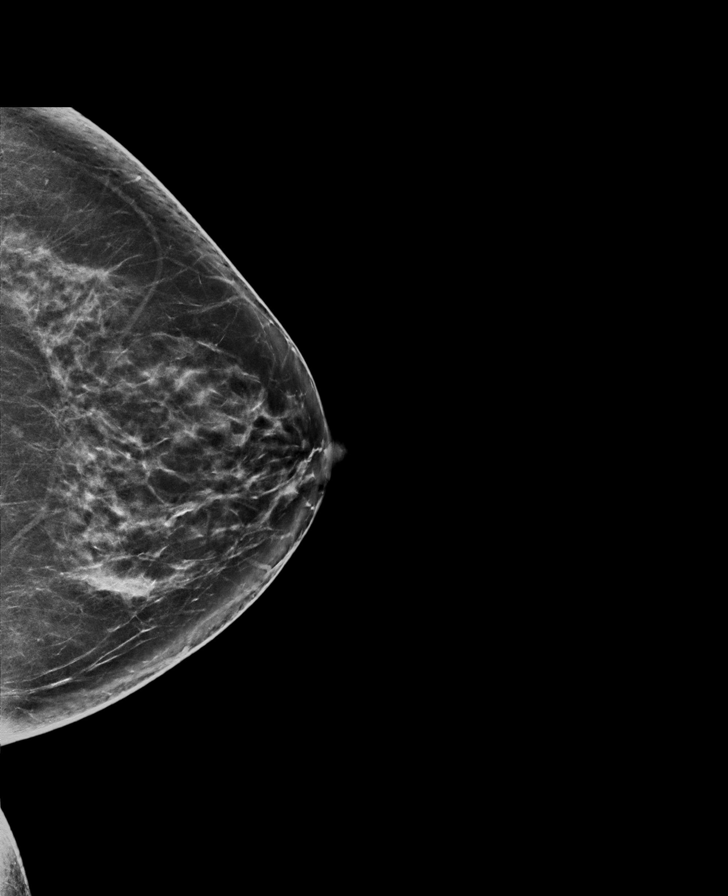

[R MLO synth-2D]
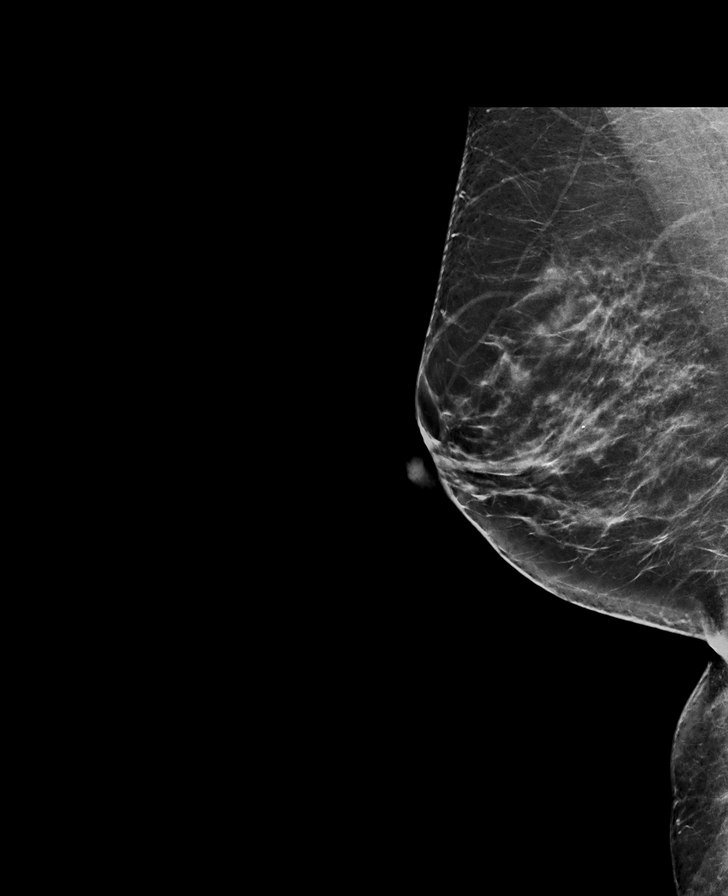

[R CC synth-2D]
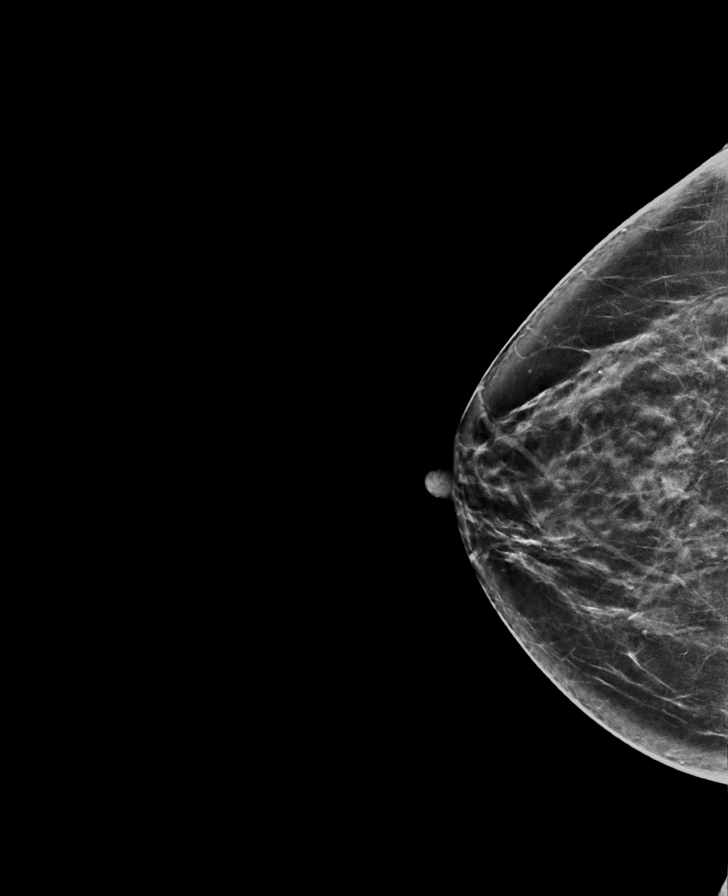

[L MLO synth-2D]
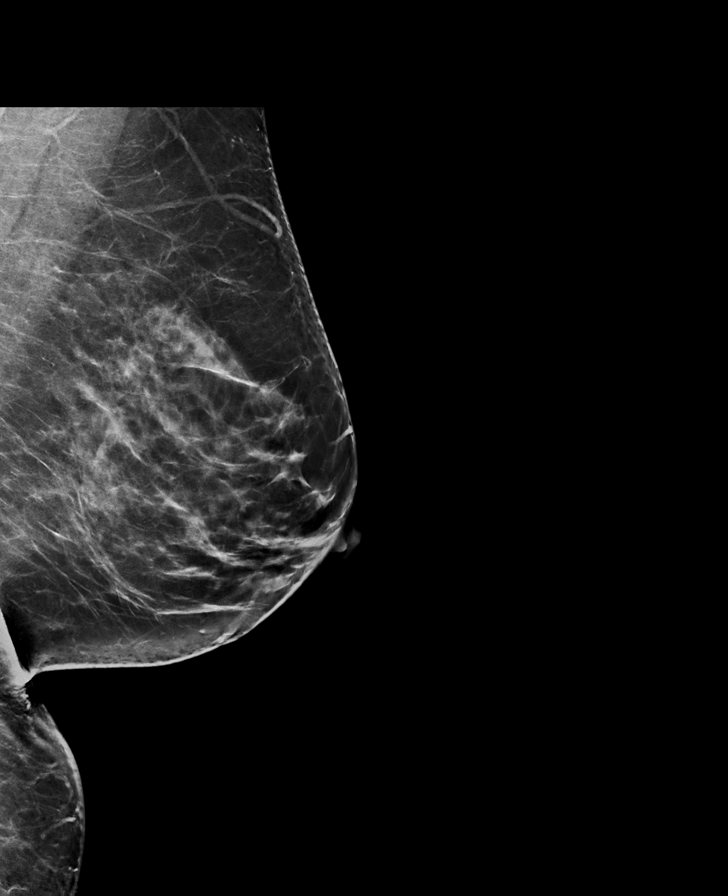

[L CC tomo · tomo slice 39/78.0]
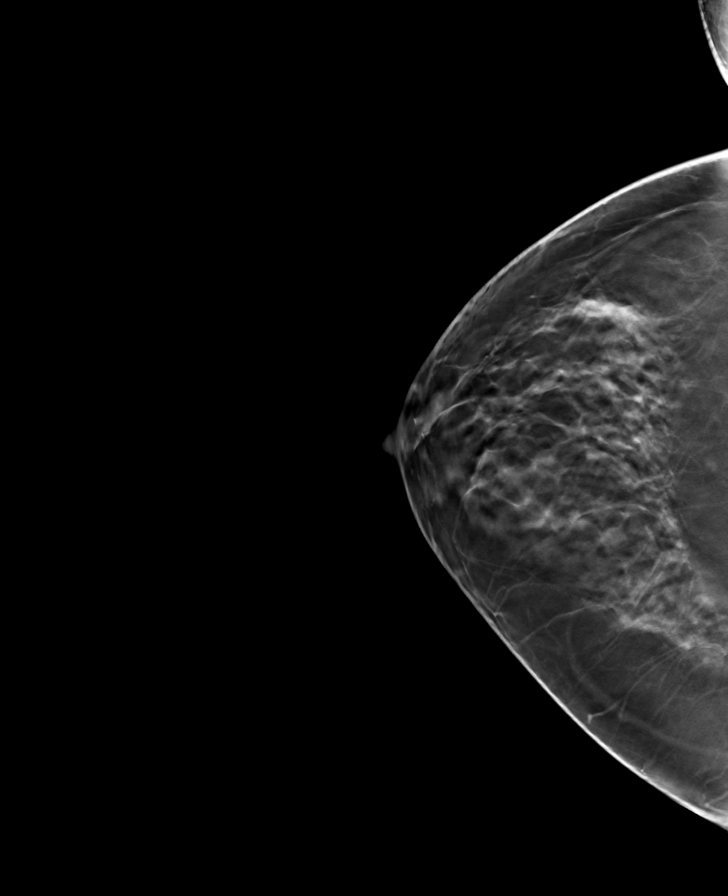

[L MLO tomo · tomo slice 41/81.0]
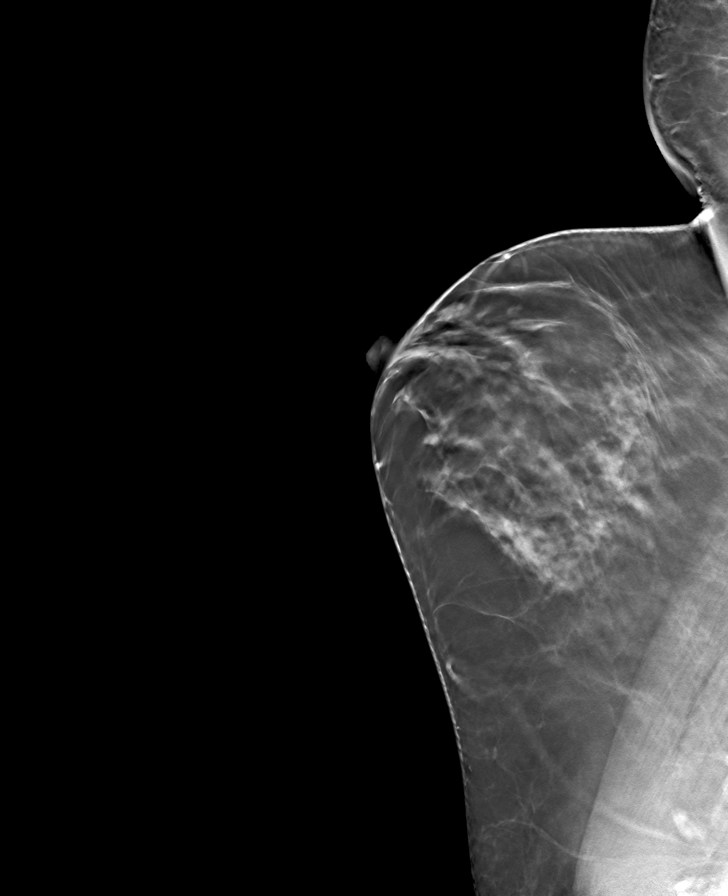

[R MLO tomo · tomo slice 37/74.0]
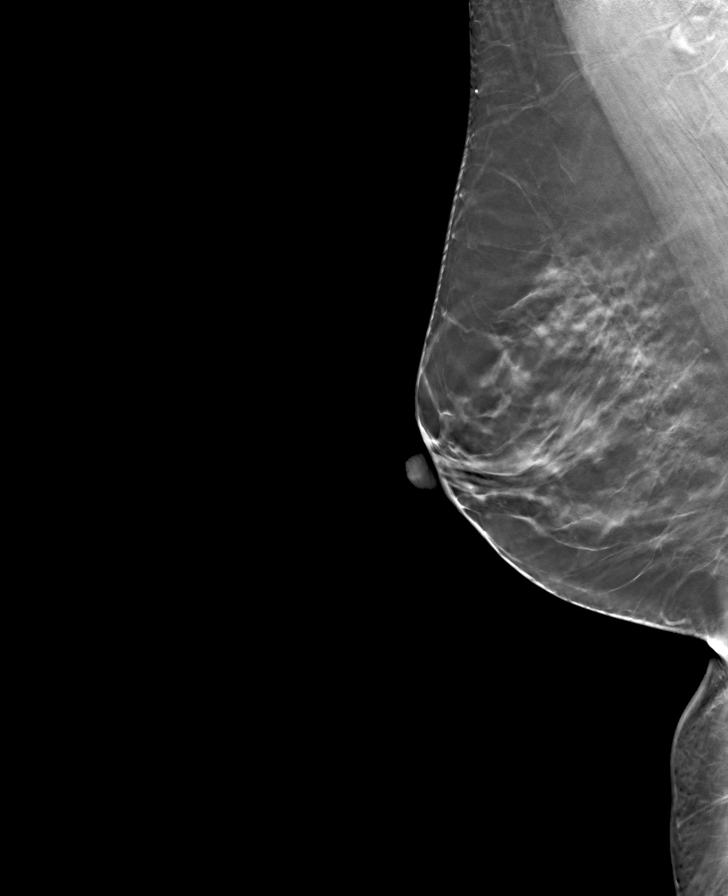

[R CC tomo · tomo slice 37/72.0]
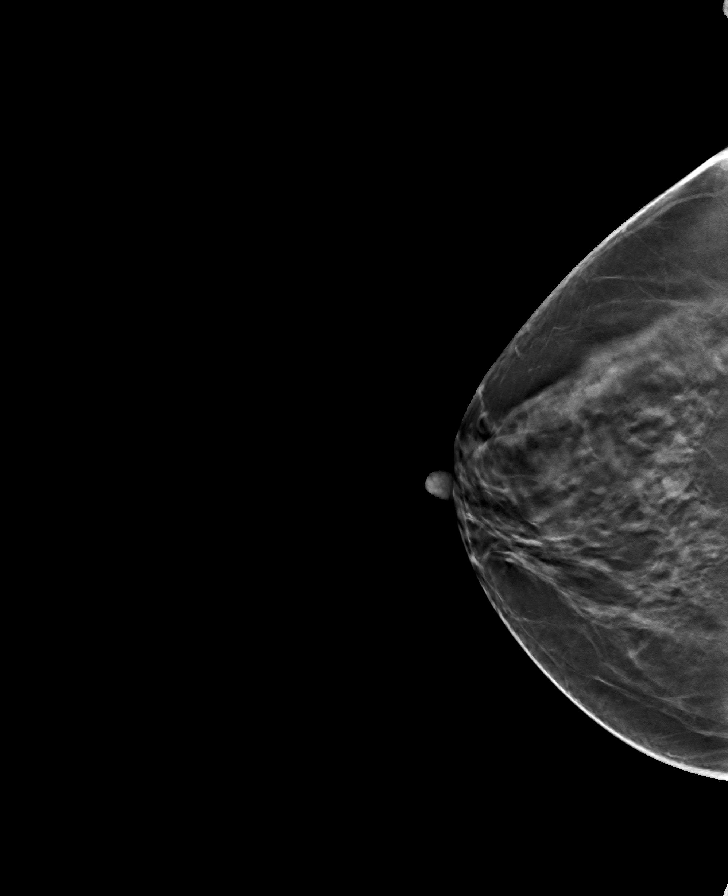

[8 of 24 positions shown; findings below may reference images not displayed]

ACR Breast Density Category c: The breast tissue is heterogeneously
dense, which may obscure small masses.
FINDINGS: There are no findings suspicious for malignancy. Images were
processed with CAD.
IMPRESSION: No mammographic evidence of malignancy. A result letter of this
screening mammogram will be mailed directly to the patient.

RECOMMENDATION:
Screening mammogram in one year. (Code:[id number]-[id number])

BI-RADS CATEGORY  1: Negative.

## 2018-08-31 ENCOUNTER — Encounter: Payer: Self-pay | Admitting: Family Medicine

## 2018-08-31 ENCOUNTER — Ambulatory Visit (INDEPENDENT_AMBULATORY_CARE_PROVIDER_SITE_OTHER): Payer: Federal, State, Local not specified - PPO | Admitting: Family Medicine

## 2018-08-31 VITALS — BP 120/80 | HR 80 | Temp 97.4°F | Resp 16 | Ht 62.0 in | Wt 152.9 lb

## 2018-08-31 DIAGNOSIS — E663 Overweight: Secondary | ICD-10-CM

## 2018-08-31 DIAGNOSIS — J3089 Other allergic rhinitis: Secondary | ICD-10-CM | POA: Diagnosis not present

## 2018-08-31 DIAGNOSIS — E538 Deficiency of other specified B group vitamins: Secondary | ICD-10-CM | POA: Diagnosis not present

## 2018-08-31 DIAGNOSIS — K219 Gastro-esophageal reflux disease without esophagitis: Secondary | ICD-10-CM

## 2018-08-31 DIAGNOSIS — R739 Hyperglycemia, unspecified: Secondary | ICD-10-CM

## 2018-08-31 DIAGNOSIS — E785 Hyperlipidemia, unspecified: Secondary | ICD-10-CM

## 2018-08-31 DIAGNOSIS — E559 Vitamin D deficiency, unspecified: Secondary | ICD-10-CM

## 2018-08-31 DIAGNOSIS — M5416 Radiculopathy, lumbar region: Secondary | ICD-10-CM

## 2018-08-31 DIAGNOSIS — H8110 Benign paroxysmal vertigo, unspecified ear: Secondary | ICD-10-CM | POA: Diagnosis not present

## 2018-08-31 MED ORDER — ROSUVASTATIN CALCIUM 5 MG PO TABS: 5.0000 mg | ORAL_TABLET | Freq: Every day | ORAL | 3 refills | Status: DC

## 2018-08-31 MED ORDER — VITAMIN D (ERGOCALCIFEROL) 1.25 MG (50000 UNIT) PO CAPS: 50000.0000 [IU] | ORAL_CAPSULE | ORAL | 1 refills | Status: DC

## 2018-08-31 NOTE — Progress Notes (Signed)
Name: Sara Randolph   MRN: [id number]    DOB: [birth year]   Date:08/31/2018       Progress Note  Subjective  Chief Complaint  Chief Complaint  Patient presents with  . Hyperlipidemia  . Obesity    HPI  B12 and Vitamin D deficiency: she is out of supplementation, she states oral B12 makes her feel weird, we will recheck level and consider B12 injection. She has noticed some right hand tingling and sometimes leg ( but may be from radiculitis)   Overweight: she was obese but has changed her diet and is now overweight. She is doing well with low carb diet  Hyperlipidemia: taking Crestor and denies side effects, last lipid panel showed significant improvement , no side effects   Pre-diabetes: she is on a low sugar diet, denies polyphagia, polydipsia or polyuria, seen by Dr. Gabriel Carina, on life style modification only. We will recheck labs  Dizziness: she states noticed some dizziness when getting up quickly, no syncope. She also had a severe episode of vertigo one week ago associated with nausea, seen by ENT   GERD: she is off Omeprazole again, she stopped taking ibuprofen and symptoms resolved, no heartburn or indigestion at this time  Low back pain with radiculitis: she states that back in July she started to have a lot of lower back pain, that can be burning like on lower spine, but goes up to mid back, can be sharp and occasionally shoots down right lateral leg, some tingling on right hand intermittently and on right foot also. No bowel or bladder incontinence. She states slept for 12 hours back in Nov and woke up feeling better, but still has intermittent symptoms, she scheduled an appointment with neurologist for further evaluation. Advised home PT now     Patient Active Problem List   Diagnosis Date Noted  . Vitamin D deficiency 02/24/2017  . BPPV (benign paroxysmal positional vertigo), unspecified laterality 08/25/2016  . Peripheral vertigo 01/13/2015  . Dyslipidemia  01/13/2015  . Cold sore 01/13/2015  . Gastric reflux 01/13/2015  . Perennial allergic rhinitis 01/13/2015  . Menopausal symptom 01/13/2015  . History of concussion 01/18/2013    Past Surgical History:  Procedure Laterality Date  . ABDOMINAL HYSTERECTOMY  1988  . CHOLECYSTECTOMY    . DILATION AND CURETTAGE OF UTERUS    . ECTOPIC PREGNANCY SURGERY    . RHINOPLASTY  1990  . SALPINGECTOMY    . TONSILLECTOMY  1969  . WRIST SURGERY  1983    Family History  Problem Relation Age of Onset  . Alzheimer's disease Mother   . Parkinsonism Father   . Heart disease Brother   . Heart attack Brother   . Depression Sister   . Osteoporosis Sister     Social History   Socioeconomic History  . Marital status: Married    Spouse name: Buddy  . Number of children: 2  . Years of education: Not on file  . Highest education level: Not on file  Occupational History  . Occupation: Sales executive  Social Needs  . Financial resource strain: Not hard at all  . Food insecurity:    Worry: Never true    Inability: Never true  . Transportation needs:    Medical: No    Non-medical: No  Tobacco Use  . Smoking status: Never Smoker  . Smokeless tobacco: Never Used  Substance and Sexual Activity  . Alcohol use: Yes    Alcohol/week: 0.0 standard drinks    Comment: Consumes  alcohol on Saturday  . Drug use: No  . Sexual activity: Yes    Partners: Male  Lifestyle  . Physical activity:    Days per week: 5 days    Minutes per session: 30 min  . Stress: Only a little  Relationships  . Social connections:    Talks on phone: More than three times a week    Gets together: Once a week    Attends religious service: Never    Active member of club or organization: No    Attends meetings of clubs or organizations: Never    Relationship status: Married  . Intimate partner violence:    Fear of current or ex partner: No    Emotionally abused: No    Physically abused: No    Forced sexual activity: No   Other Topics Concern  . Not on file  Social History Narrative  . Not on file     Current Outpatient Medications:  .  acetaminophen (TYLENOL) 500 MG tablet, Take 1 tablet by mouth daily., Disp: , Rfl:  .  levocetirizine (XYZAL) 5 MG tablet, Take 2.5 mg by mouth every evening. , Disp: , Rfl:  .  rosuvastatin (CRESTOR) 5 MG tablet, Take 1 tablet (5 mg total) by mouth daily., Disp: 90 tablet, Rfl: 3 .  valACYclovir (VALTREX) 1000 MG tablet, TAKE 1 TABLET BY MOUTH TWICE DAILY, Disp: 30 tablet, Rfl: 0 .  Vitamin D, Ergocalciferol, (DRISDOL) 1.25 MG (50000 UT) CAPS capsule, Take 1 capsule (50,000 Units total) by mouth once a week., Disp: 12 capsule, Rfl: 1  Allergies  Allergen Reactions  . Atorvastatin     Muscle ache  . Pravastatin Itching    dizziness  . Venlafaxine Other (See Comments)    dizziness  . Hydrocodone Nausea Only    I personally reviewed active problem list, medication list, allergies, family history, social history with the patient/caregiver today.   ROS  Constitutional: Negative for fever , positive for mild  weight change.  Respiratory: Negative for cough and shortness of breath.   Cardiovascular: Negative for chest pain or palpitations.  Gastrointestinal: Negative for abdominal pain, no bowel changes.  Musculoskeletal: Negative for gait problem or joint swelling.  Skin: Negative for rash.   Neurological: Negative for dizziness or headache.  No other specific complaints in a complete review of systems (except as listed in HPI above).   Objective  Vitals:   08/31/18 0803  BP: 120/80  Pulse: 80  Resp: 16  Temp: (!) 97.4 F (36.3 C)  TempSrc: Oral  SpO2: 98%  Weight: 152 lb 14.4 oz (69.4 kg)  Height: 5\' 2"  (1.575 m)    Body mass index is 27.97 kg/m.  Physical Exam  Constitutional: Patient appears well-developed and well-nourished.  No distress.  HEENT: head atraumatic, normocephalic, pupils equal and reactive to light,  neck supple, throat within  normal limits Cardiovascular: Normal rate, regular rhythm and normal heart sounds.  No murmur heard. No BLE edema. Pulmonary/Chest: Effort normal and breath sounds normal. No respiratory distress. Abdominal: Soft.  There is no tenderness. Muscular Skeletal:normal grip and gait, no radiculitis today  Psychiatric: Patient has a normal mood and affect. behavior is normal. Judgment and thought content normal.  PHQ2/9: Depression screen [facility] 2/9 04/28/2018 02/28/2018 07/12/2017 02/22/2017 08/25/2016  Decreased Interest 0 0 0 0 0  Down, Depressed, Hopeless 0 0 0 0 0  PHQ - 2 Score 0 0 0 0 0  Altered sleeping - 0 - - -  Tired,  decreased energy - 0 - - -  Change in appetite - 0 - - -  Feeling bad or failure about yourself  - 0 - - -  Trouble concentrating - 0 - - -  Moving slowly or fidgety/restless - 0 - - -  PHQ-9 Score - 0 - - -  Difficult doing work/chores - Not difficult at all - - -     Fall Risk: Fall Risk  04/28/2018 04/28/2018 02/28/2018 08/25/2017 07/12/2017  Falls in the past year? - Yes No Yes No  Number falls in past yr: - 1 - 1 -  Comment - fell over cat; felt dizzy/ vertigo when taking venlafaxine which she has now stopped - - -  Injury with Fall? (No Data) Yes - Yes -  Comment concussion - - - -  Follow up - - - Education provided -     Assessment & Plan   1. Dyslipidemia  - rosuvastatin (CRESTOR) 5 MG tablet; Take 1 tablet (5 mg total) by mouth daily.  Dispense: 90 tablet; Refill: 3  2. BPPV (benign paroxysmal positional vertigo), unspecified laterality   3. Perennial allergic rhinitis  Taking half dose of xyzal   4. B12 deficiency  Having some tingling on hand and legs, advised to try SL B12 - vitamin B12 today   5. Gastric reflux  Improved since she stopped taking ibuprofen daily   6. Vitamin D deficiency  - Vitamin D, Ergocalciferol, (DRISDOL) 1.25 MG (50000 UT) CAPS capsule; Take 1 capsule (50,000 Units total) by mouth once a week.  Dispense: 12 capsule;  Refill: 1  7. Overweight (BMI 25.0-29.9)  Doing well   8. Hyperglycemia  Seen by Dr. Gabriel Carina and hgbA1C was 5.9% she went to dietician.   9. Lumbar back pain with radiculopathy affecting right lower extremity  Explained likely from taking care of grandchild that is 83 months old, she has follow up with neurologist, discussed strength training and activity modification at home , while taking care of grandchild

## 2018-08-31 NOTE — Patient Instructions (Signed)
Low Back Sprain Rehab  Ask your health care provider which exercises are safe for you. Do exercises exactly as told by your health care provider and adjust them as directed. It is normal to feel mild stretching, pulling, tightness, or discomfort as you do these exercises, but you should stop right away if you feel sudden pain or your pain gets worse. Do not begin these exercises until told by your health care provider.  Stretching and range of motion exercises  These exercises warm up your muscles and joints and improve the movement and flexibility of your back. These exercises also help to relieve pain, numbness, and tingling.  Exercise A: Lumbar rotation    1. Lie on your back on a firm surface and bend your knees.  2. Straighten your arms out to your sides so each arm forms an "L" shape with a side of your body (a 90 degree angle).  3. Slowly move both of your knees to one side of your body until you feel a stretch in your lower back. Try not to let your shoulders move off of the floor.  4. Hold for __________ seconds.  5. Tense your abdominal muscles and slowly move your knees back to the starting position.  6. Repeat this exercise on the other side of your body.  Repeat __________ times. Complete this exercise __________ times a day.  Exercise B: Prone extension on elbows    1. Lie on your abdomen on a firm surface.  2. Prop yourself up on your elbows.  3. Use your arms to help lift your chest up until you feel a gentle stretch in your abdomen and your lower back.  ? This will place some of your body weight on your elbows. If this is uncomfortable, try stacking pillows under your chest.  ? Your hips should stay down, against the surface that you are lying on. Keep your hip and back muscles relaxed.  4. Hold for __________ seconds.  5. Slowly relax your upper body and return to the starting position.  Repeat __________ times. Complete this exercise __________ times a day.  Strengthening exercises  These  exercises build strength and endurance in your back. Endurance is the ability to use your muscles for a long time, even after they get tired.  Exercise C: Pelvic tilt  1. Lie on your back on a firm surface. Bend your knees and keep your feet flat.  2. Tense your abdominal muscles. Tip your pelvis up toward the ceiling and flatten your lower back into the floor.  ? To help with this exercise, you may place a small towel under your lower back and try to push your back into the towel.  3. Hold for __________ seconds.  4. Let your muscles relax completely before you repeat this exercise.  Repeat __________ times. Complete this exercise __________ times a day.  Exercise D: Alternating arm and leg raises    1. Get on your hands and knees on a firm surface. If you are on a hard floor, you may want to use padding to cushion your knees, such as an exercise mat.  2. Line up your arms and legs. Your hands should be below your shoulders, and your knees should be below your hips.  3. Lift your left leg behind you. At the same time, raise your right arm and straighten it in front of you.  ? Do not lift your leg higher than your hip.  ? Do not lift your arm   higher than your shoulder.  ? Keep your abdominal and back muscles tight.  ? Keep your hips facing the ground.  ? Do not arch your back.  ? Keep your balance carefully, and do not hold your breath.  4. Hold for __________ seconds.  5. Slowly return to the starting position and repeat with your right leg and your left arm.  Repeat __________ times. Complete this exercise __________ times a day.  Exercise E: Abdominal set with straight leg raise    1. Lie on your back on a firm surface.  2. Bend one of your knees and keep your other leg straight.  3. Tense your abdominal muscles and lift your straight leg up, 4-6 inches (10-15 cm) off the ground.  4. Keep your abdominal muscles tight and hold for __________ seconds.  ? Do not hold your breath.  ? Do not arch your back. Keep it  flat against the ground.  5. Keep your abdominal muscles tense as you slowly lower your leg back to the starting position.  6. Repeat with your other leg.  Repeat __________ times. Complete this exercise __________ times a day.  Posture and body mechanics    Body mechanics refers to the movements and positions of your body while you do your daily activities. Posture is part of body mechanics. Good posture and healthy body mechanics can help to relieve stress in your body's tissues and joints. Good posture means that your spine is in its natural S-curve position (your spine is neutral), your shoulders are pulled back slightly, and your head is not tipped forward. The following are general guidelines for applying improved posture and body mechanics to your everyday activities.  Standing    · When standing, keep your spine neutral and your feet about hip-width apart. Keep a slight bend in your knees. Your ears, shoulders, and hips should line up.  · When you do a task in which you stand in one place for a long time, place one foot up on a stable object that is 2-4 inches (5-10 cm) high, such as a footstool. This helps keep your spine neutral.  Sitting    · When sitting, keep your spine neutral and keep your feet flat on the floor. Use a footrest, if necessary, and keep your thighs parallel to the floor. Avoid rounding your shoulders, and avoid tilting your head forward.  · When working at a desk or a computer, keep your desk at a height where your hands are slightly lower than your elbows. Slide your chair under your desk so you are close enough to maintain good posture.  · When working at a computer, place your monitor at a height where you are looking straight ahead and you do not have to tilt your head forward or downward to look at the screen.  Resting    · When lying down and resting, avoid positions that are most painful for you.  · If you have pain with activities such as sitting, bending, stooping, or squatting  (flexion-based activities), lie in a position in which your body does not bend very much. For example, avoid curling up on your side with your arms and knees near your chest (fetal position).  · If you have pain with activities such as standing for a long time or reaching with your arms (extension-based activities), lie with your spine in a neutral position and bend your knees slightly. Try the following positions:  · Lying on your side with a   pillow between your knees.  · Lying on your back with a pillow under your knees.  Lifting    · When lifting objects, keep your feet at least shoulder-width apart and tighten your abdominal muscles.  · Bend your knees and hips and keep your spine neutral. It is important to lift using the strength of your legs, not your back. Do not lock your knees straight out.  · Always ask for help to lift heavy or awkward objects.  This information is not intended to replace advice given to you by your health care provider. Make sure you discuss any questions you have with your health care provider.  Document Released: 07/19/2005 Document Revised: 03/25/2016 Document Reviewed: 04/30/2015  Elsevier Interactive Patient Education © 2019 [employer]

## 2018-09-01 LAB — HEMOGLOBIN A1C
Hgb A1c MFr Bld: 5.7 % of total Hgb — ABNORMAL HIGH (ref <5.7)
Mean Plasma Glucose: 117 (calc)
eAG (mmol/L): 6.5 (calc)

## 2018-09-01 LAB — B12 AND FOLATE PANEL
Folate: 13.8 ng/mL
Vitamin B-12: 568 pg/mL (ref 200–1100)

## 2018-09-18 DIAGNOSIS — M5416 Radiculopathy, lumbar region: Secondary | ICD-10-CM | POA: Diagnosis not present

## 2018-09-18 DIAGNOSIS — M542 Cervicalgia: Secondary | ICD-10-CM | POA: Diagnosis not present

## 2018-09-18 DIAGNOSIS — M545 Low back pain: Secondary | ICD-10-CM | POA: Diagnosis not present

## 2018-09-18 DIAGNOSIS — M5412 Radiculopathy, cervical region: Secondary | ICD-10-CM | POA: Diagnosis not present

## 2018-09-20 DIAGNOSIS — M2550 Pain in unspecified joint: Secondary | ICD-10-CM | POA: Diagnosis not present

## 2018-09-20 DIAGNOSIS — R509 Fever, unspecified: Secondary | ICD-10-CM | POA: Diagnosis not present

## 2018-09-20 DIAGNOSIS — K529 Noninfective gastroenteritis and colitis, unspecified: Secondary | ICD-10-CM | POA: Diagnosis not present

## 2018-09-25 ENCOUNTER — Ambulatory Visit: Payer: Federal, State, Local not specified - PPO | Admitting: Nurse Practitioner

## 2018-09-25 ENCOUNTER — Encounter: Payer: Self-pay | Admitting: Nurse Practitioner

## 2018-09-25 VITALS — BP 102/64 | HR 70 | Temp 98.0°F | Resp 14 | Ht 62.0 in | Wt 155.7 lb

## 2018-09-25 DIAGNOSIS — J029 Acute pharyngitis, unspecified: Secondary | ICD-10-CM | POA: Diagnosis not present

## 2018-09-25 DIAGNOSIS — Z20828 Contact with and (suspected) exposure to other viral communicable diseases: Secondary | ICD-10-CM

## 2018-09-25 DIAGNOSIS — K2100 Gastro-esophageal reflux disease with esophagitis, without bleeding: Secondary | ICD-10-CM

## 2018-09-25 LAB — POCT INFLUENZA A/B
Influenza A, POC: NEGATIVE
Influenza B, POC: NEGATIVE

## 2018-09-25 LAB — POCT RAPID STREP A (OFFICE): Rapid Strep A Screen: NEGATIVE

## 2018-09-25 MED ORDER — MAGIC MOUTHWASH W/LIDOCAINE: 5.0000 mL | Freq: Three times a day (TID) | ORAL | 0 refills | Status: DC | PRN

## 2018-09-25 NOTE — Progress Notes (Signed)
giName: Sara Randolph   MRN: [id number]    DOB: 02-11-1961   Date:09/25/2018       Progress Note  Subjective  Chief Complaint  Chief Complaint  Patient presents with  . Sore Throat    "feels a little funny" - grandson had strep & (+) flu  . Gastroesophageal Reflux    not sure if it is related to the stomach bug that she had last week    HPI  Patient takes care of 62 month old grandson he was positive for strep and flu. States this morning started getting some sore throat and increase GERD. States last week had a 24 hour GI bug- nausea, vomiting, and diarrhea that self-resolved.   Denies fevers, chills, cough, shortness of breath, facial pain, body aches.   Patient Active Problem List   Diagnosis Date Noted  . Lumbar back pain with radiculopathy affecting right lower extremity 08/31/2018  . Vitamin D deficiency 02/24/2017  . BPPV (benign paroxysmal positional vertigo), unspecified laterality 08/25/2016  . Peripheral vertigo 01/13/2015  . Dyslipidemia 01/13/2015  . Cold sore 01/13/2015  . Gastric reflux 01/13/2015  . Perennial allergic rhinitis 01/13/2015  . Menopausal symptom 01/13/2015  . History of concussion 01/18/2013    Past Medical History:  Diagnosis Date  . Allergy   . Anxiety   . Asthma   . Concussion with no loss of consciousness 02/28/2013  . GERD (gastroesophageal reflux disease)   . Hemorrhoids   . High cholesterol   . Lung nodule   . Seborrheic keratosis     Past Surgical History:  Procedure Laterality Date  . ABDOMINAL HYSTERECTOMY  1988  . CHOLECYSTECTOMY    . DILATION AND CURETTAGE OF UTERUS    . ECTOPIC PREGNANCY SURGERY    . RHINOPLASTY  1990  . SALPINGECTOMY    . TONSILLECTOMY  1969  . WRIST SURGERY  1983    Social History   Tobacco Use  . Smoking status: Never Smoker  . Smokeless tobacco: Never Used  Substance Use Topics  . Alcohol use: Yes    Alcohol/week: 0.0 standard drinks    Comment: Consumes alcohol on Saturday      Current Outpatient Medications:  .  loperamide (IMODIUM A-D) 2 MG tablet, Take 2 capsules after first loose stool, then one capsule after each following loose stool. Do not exceed 16 mg per day., Disp: , Rfl:  .  ondansetron (ZOFRAN-ODT) 4 MG disintegrating tablet, Take by mouth., Disp: , Rfl:  .  acetaminophen (TYLENOL) 500 MG tablet, Take 1 tablet by mouth daily., Disp: , Rfl:  .  levocetirizine (XYZAL) 5 MG tablet, Take 2.5 mg by mouth every evening. , Disp: , Rfl:  .  loratadine (CLARITIN) 10 MG tablet, Take by mouth., Disp: , Rfl:  .  rosuvastatin (CRESTOR) 5 MG tablet, Take 1 tablet (5 mg total) by mouth daily., Disp: 90 tablet, Rfl: 3 .  SYMBICORT 80-4.5 MCG/ACT inhaler, Inhale 2 puffs into the lungs 2 (two) times daily., Disp: , Rfl:  .  valACYclovir (VALTREX) 1000 MG tablet, TAKE 1 TABLET BY MOUTH TWICE DAILY, Disp: 30 tablet, Rfl: 0 .  Vitamin D, Ergocalciferol, (DRISDOL) 1.25 MG (50000 UT) CAPS capsule, Take 1 capsule (50,000 Units total) by mouth once a week., Disp: 12 capsule, Rfl: 1  Allergies  Allergen Reactions  . Atorvastatin     Muscle ache  . Pravastatin Itching    dizziness  . Venlafaxine Other (See Comments)    dizziness  . Hydrocodone  Nausea Only    ROS   No other specific complaints in a complete review of systems (except as listed in HPI above).  Objective  Vitals:   09/25/18 1553  BP: 102/64  Pulse: 70  Resp: 14  Temp: 98 F (36.7 C)  TempSrc: Oral  SpO2: 93%  Weight: 155 lb 11.2 oz (70.6 kg)  Height: 5\' 2"  (1.575 m)    Body mass index is 28.48 kg/m.  Nursing Note and Vital Signs reviewed.  Physical Exam HENT:     Head: Normocephalic and atraumatic.     Right Ear: Hearing, tympanic membrane, ear canal and external ear normal.     Left Ear: Hearing, tympanic membrane, ear canal and external ear normal.     Nose: Nose normal.     Right Sinus: No maxillary sinus tenderness or frontal sinus tenderness.     Left Sinus: No maxillary  sinus tenderness or frontal sinus tenderness.     Mouth/Throat:     Mouth: Mucous membranes are moist.     Pharynx: Uvula midline. No oropharyngeal exudate or posterior oropharyngeal erythema.  Eyes:     General:        Right eye: No discharge.        Left eye: No discharge.     Conjunctiva/sclera: Conjunctivae normal.  Neck:     Musculoskeletal: Normal range of motion.  Cardiovascular:     Rate and Rhythm: Normal rate.  Pulmonary:     Effort: Pulmonary effort is normal.     Breath sounds: Normal breath sounds.  Lymphadenopathy:     Cervical: No cervical adenopathy.  Skin:    General: Skin is warm and dry.     Findings: No rash.  Neurological:     Mental Status: She is alert.  Psychiatric:        Judgment: Judgment normal.      No results found for this or any previous visit (from the past 48 hour(s)).  Assessment & Plan  1. Sore throat Patient requested strep and flu swab- both negative.  - POCT rapid strep A - magic mouthwash w/lidocaine SOLN; Take 5 mLs by mouth 3 (three) times daily as needed for mouth pain.  Dispense: 50 mL; Refill: 0  2. Exposure to the flu Discussed hygiene and prevention. No severe commodities to consider tamiflu preventative. - POCT Influenza A/B  3. Gastroesophageal reflux disease with esophagitis Has maalox, discussed diet, take omeprazole every other day for the next week  - magic mouthwash w/lidocaine SOLN; Take 5 mLs by mouth 3 (three) times daily as needed for mouth pain.  Dispense: 50 mL; Refill: 0

## 2018-09-25 NOTE — Patient Instructions (Addendum)
- Take magic mouth wast twice a day- can take up to three times a day as needed. Swish in mouth for at 30 seconds and swallow. The maalox portion of this can additionally provide some relief of constipation.   Preventing Influenza, Adult Influenza, more commonly known as "the flu," is a viral infection that mainly affects the respiratory tract. The respiratory tract includes structures that help you breathe, such as the lungs, nose, and throat. The flu causes many common cold symptoms, as well as a high fever and body aches. The flu spreads easily from person to person (is contagious). The flu is most common from December through March. This is called flu season.You can catch the flu virus by:  Breathing in droplets from an infected person's cough or sneeze.  Touching something that was recently contaminated with the virus and then touching your mouth, nose, or eyes. What can I do to lower my risk?  Practicing good health habits. This is especially important during flu season. ? Avoid contact with people who are sick with flu or cold symptoms. ? Wash your hands with soap and water often. If soap and water are not available, use alcohol-based hand sanitizer. ? Avoid touching your hands to your face, especially when you have not washed your hands recently. ? Use a disinfectant to clean surfaces at home and at work that may be contaminated with the flu virus. ? Keep your body's disease-fighting system (immune system) in good shape by eating a healthy diet, drinking plenty of fluids, getting enough sleep, and exercising regularly. If you do get the flu, avoid spreading it to others by:  Staying home until your symptoms have been gone for at least one day.  Covering your mouth and nose when you cough or sneeze.  Avoiding close contact with others, especially babies and elderly people. How is this treated? Most people recover from the flu by resting at home and drinking plenty of fluids. However,  a prescription antiviral medicine may reduce your flu symptoms and may make your flu go away sooner. This medicine must be started within a few days of getting flu symptoms. You can talk with your health care provider about whether you need an antiviral medicine. Antiviral medicine may be prescribed for people who are at risk for more serious flu symptoms. This includes people who:  Are older than age 43.  Are pregnant.  Have a condition that makes the flu worse or more dangerous. Where to find more information  Centers for Disease Control and Prevention: http://www.smith-bell.org/  [facility].ee: azureicus.com  American Academy of Family Physicians: familydoctor.org/familydoctor/en/kids/vaccines/preventing-the-flu.html Contact a health care provider if:  You have influenza and you develop new symptoms.  You have: ? Chest pain. ? Diarrhea. ? A fever.  Your cough gets worse, or you produce more mucus. Summary  The best way to prevent the flu is to get a flu shot every year in the fall.  Even if you get the flu after you have received the yearly vaccine, your flu may be milder and go away sooner because of your flu shot.  If you get the flu, antiviral medicines that are started with a few days of symptoms may reduce your flu symptoms and may make your flu go away sooner.  You can also help prevent the flu by practicing good health habits.  It is important that you drink plenty of fluids, rest. Cover your nose/mouth when you cough or sneeze and wash your hands well and often. Here  are some helpful things you can use or pick up over the counter from the pharmacy to help with flu and upper respiratory symptoms:   For Fever/Pain: Acetaminophen every 6 hours as needed (maximum of 3000mg  a day). If you are still uncomfortable you can add ibuprofen OR naproxen  For coughing: try dextromethorphan for a cough suppressant, and/or a cool mist humidifier, lozenges  For sore  throat: saline gargles, honey herbal tea, lozenges, throat spray  To dry out your nose: try an antihistamine like loratadine (non-sedating) or diphenhydramine (sedating) or others To relieve a stuffy nose: try an oral decongestant  Like pseudoephedrine if you are under the age of 58 and do not have high blood pressure, neti pot To make blowing your nose easier and relieve chest congestion: guaifenesin 400mg  every 4-6 hours of guaifenesin ER [phone] mg every 12 hours. Do not take more than 2,400mg  a day.

## 2018-10-05 DIAGNOSIS — K080 Exfoliation of teeth due to systemic causes: Secondary | ICD-10-CM | POA: Diagnosis not present

## 2018-11-06 ENCOUNTER — Encounter: Payer: Self-pay | Admitting: Family Medicine

## 2018-12-14 ENCOUNTER — Encounter: Payer: Self-pay | Admitting: Family Medicine

## 2018-12-18 ENCOUNTER — Ambulatory Visit (INDEPENDENT_AMBULATORY_CARE_PROVIDER_SITE_OTHER): Payer: Federal, State, Local not specified - PPO | Admitting: Family Medicine

## 2018-12-18 ENCOUNTER — Encounter: Payer: Self-pay | Admitting: Family Medicine

## 2018-12-18 VITALS — Wt 160.0 lb

## 2018-12-18 DIAGNOSIS — M5416 Radiculopathy, lumbar region: Secondary | ICD-10-CM

## 2018-12-18 MED ORDER — PREDNISONE 5 MG (48) PO TBPK: ORAL_TABLET | ORAL | 0 refills | Status: DC

## 2018-12-18 NOTE — Progress Notes (Signed)
Name: Sara Randolph   MRN: [id number]    DOB: 1961/06/07   Date:12/18/2018       Progress Note  Subjective  Chief Complaint  Chief Complaint  Patient presents with  . Back Pain    numbness radiating down right leg, keeping her from sleep at night    I connected with  Gaetano Hawthorne Cargile  on 12/18/18 at  8:00 AM EDT by a video enabled telemedicine application and verified that I am speaking with the correct person using two identifiers.  I discussed the limitations of evaluation and management by telemedicine and the availability of in person appointments. The patient expressed understanding and agreed to proceed. Staff also discussed with the patient that there may be a patient responsible charge related to this service. Patient Location: Home Provider Location: Home Additional Individuals present: None  HPI  Pt presents with concern for bilateral low back pain with right sided sciatica.  Last July she had an episode of back pain that lasted about 6 months - she did see a neurosurgeon (Dr. Donald Pore) in February 2020, she did not have any imaging done at that time because her symptoms had resolved by then.  This time, her pain started in March, and now her pain has been worsening.  Her pain is mostly at night - she cannot sleep on the right side, has pain in her right patella.  Has burning sensation in the low back at the sacrum that will radiate into the right upper leg down to the knee. When the burning occurs, her RLE feels heavy.  Does have an appointment with Dr. Donald Pore on March 24, but would like a referral for 2nd opinion if possible.  She has been doing home exercises.  She is taking ibuprofen 400mg  BID.   Patient Active Problem List   Diagnosis Date Noted  . Lumbar back pain with radiculopathy affecting right lower extremity 08/31/2018  . Vitamin D deficiency 02/24/2017  . BPPV (benign paroxysmal positional vertigo), unspecified laterality 08/25/2016  . Peripheral vertigo  01/13/2015  . Dyslipidemia 01/13/2015  . Cold sore 01/13/2015  . Gastric reflux 01/13/2015  . Perennial allergic rhinitis 01/13/2015  . Menopausal symptom 01/13/2015  . History of concussion 01/18/2013    Past Surgical History:  Procedure Laterality Date  . ABDOMINAL HYSTERECTOMY  1988  . CHOLECYSTECTOMY    . DILATION AND CURETTAGE OF UTERUS    . ECTOPIC PREGNANCY SURGERY    . RHINOPLASTY  1990  . SALPINGECTOMY    . TONSILLECTOMY  1969  . WRIST SURGERY  1983    Family History  Problem Relation Age of Onset  . Alzheimer's disease Mother   . Parkinsonism Father   . Heart disease Brother   . Heart attack Brother   . Depression Sister   . Osteoporosis Sister     Social History   Socioeconomic History  . Marital status: Married    Spouse name: Buddy  . Number of children: 2  . Years of education: Not on file  . Highest education level: Not on file  Occupational History  . Occupation: Sales executive  Social Needs  . Financial resource strain: Not hard at all  . Food insecurity:    Worry: Never true    Inability: Never true  . Transportation needs:    Medical: No    Non-medical: No  Tobacco Use  . Smoking status: Never Smoker  . Smokeless tobacco: Never Used  Substance and Sexual Activity  . Alcohol use:  Yes    Alcohol/week: 0.0 standard drinks    Comment: Consumes alcohol on Saturday  . Drug use: No  . Sexual activity: Yes    Partners: Male  Lifestyle  . Physical activity:    Days per week: 5 days    Minutes per session: 30 min  . Stress: Only a little  Relationships  . Social connections:    Talks on phone: More than three times a week    Gets together: Once a week    Attends religious service: Never    Active member of club or organization: No    Attends meetings of clubs or organizations: Never    Relationship status: Married  . Intimate partner violence:    Fear of current or ex partner: No    Emotionally abused: No    Physically abused: No     Forced sexual activity: No  Other Topics Concern  . Not on file  Social History Narrative  . Not on file     Current Outpatient Medications:  .  acetaminophen (TYLENOL) 500 MG tablet, Take 1 tablet by mouth daily., Disp: , Rfl:  .  levocetirizine (XYZAL) 5 MG tablet, Take 2.5 mg by mouth every evening. , Disp: , Rfl:  .  magic mouthwash w/lidocaine SOLN, Take 5 mLs by mouth 3 (three) times daily as needed for mouth pain., Disp: 50 mL, Rfl: 0 .  Multiple Vitamin (MULTIVITAMIN) tablet, Take 1 tablet by mouth daily., Disp: , Rfl:  .  omeprazole (PRILOSEC) 40 MG capsule, Take 40 mg by mouth daily as needed., Disp: , Rfl:  .  rosuvastatin (CRESTOR) 5 MG tablet, Take 1 tablet (5 mg total) by mouth daily., Disp: 90 tablet, Rfl: 3 .  SYMBICORT 80-4.5 MCG/ACT inhaler, Inhale 2 puffs into the lungs 2 (two) times daily., Disp: , Rfl:  .  valACYclovir (VALTREX) 1000 MG tablet, TAKE 1 TABLET BY MOUTH TWICE DAILY, Disp: 30 tablet, Rfl: 0 .  Vitamin D, Ergocalciferol, (DRISDOL) 1.25 MG (50000 UT) CAPS capsule, Take 1 capsule (50,000 Units total) by mouth once a week., Disp: 12 capsule, Rfl: 1 .  loratadine (CLARITIN) 10 MG tablet, Take by mouth., Disp: , Rfl:   Allergies  Allergen Reactions  . Atorvastatin     Muscle ache  . Pravastatin Itching    dizziness  . Venlafaxine Other (See Comments)    dizziness  . Hydrocodone Nausea Only    I personally reviewed active problem list, medication list, allergies, notes from last encounter, lab results with the patient/caregiver today.   ROS Ten systems reviewed and is negative except as mentioned in HPI  Objective  Virtual encounter, vitals not obtained.  Body mass index is 29.26 kg/m.  Physical Exam Constitutional: Patient appears well-developed and well-nourished. No distress.  HENT: Head: Normocephalic and atraumatic.  Neck: Normal range of motion. Pulmonary/Chest: Effort normal. No respiratory distress. Speaking in complete sentences  Neurological: Pt is alert and oriented to person, place, and time. Coordination, speech are normal.  Gait is very smooth and normal.  Psychiatric: Patient has a normal mood and affect. behavior is normal. Judgment and thought content normal.  No results found for this or any previous visit (from the past 72 hour(s)).  PHQ2/9: Depression screen [facility] 2/9 12/18/2018 09/25/2018 04/28/2018 02/28/2018 07/12/2017  Decreased Interest 0 0 0 0 0  Down, Depressed, Hopeless 0 0 0 0 0  PHQ - 2 Score 0 0 0 0 0  Altered sleeping 0 - - 0 -  Tired, decreased energy 0 - - 0 -  Change in appetite 0 - - 0 -  Feeling bad or failure about yourself  0 - - 0 -  Trouble concentrating 0 - - 0 -  Moving slowly or fidgety/restless 0 - - 0 -  Suicidal thoughts 0 - - - -  PHQ-9 Score 0 - - 0 -  Difficult doing work/chores Not difficult at all - - Not difficult at all -   PHQ-2/9 Result is negative.    Fall Risk: Fall Risk  12/18/2018 09/25/2018 04/28/2018 04/28/2018 02/28/2018  Falls in the past year? 0 0 - Yes No  Number falls in past yr: 0 0 - 1 -  Comment - - - fell over cat; felt dizzy/ vertigo when taking venlafaxine which she has now stopped -  Injury with Fall? 0 0 (No Data) Yes -  Comment - - concussion - -  Follow up - - - - -    Assessment & Plan  1. Lumbar back pain with radiculopathy affecting right lower extremity - predniSONE (STERAPRED UNI-PAK 48 TAB) 5 MG (48) TBPK tablet; Take as directed  Dispense: 48 tablet; Refill: 0 - Ambulatory referral to Spine Surgery   I discussed the assessment and treatment plan with the patient. The patient was provided an opportunity to ask questions and all were answered. The patient agreed with the plan and demonstrated an understanding of the instructions.  The patient was advised to call back or seek an in-person evaluation if the symptoms worsen or if the condition fails to improve as anticipated.  I provided 16 minutes of non-face-to-face time during this  encounter.

## 2019-01-17 DIAGNOSIS — M545 Low back pain: Secondary | ICD-10-CM | POA: Diagnosis not present

## 2019-01-17 DIAGNOSIS — M25512 Pain in left shoulder: Secondary | ICD-10-CM | POA: Diagnosis not present

## 2019-01-31 ENCOUNTER — Other Ambulatory Visit: Payer: Self-pay | Admitting: Family Medicine

## 2019-01-31 DIAGNOSIS — B001 Herpesviral vesicular dermatitis: Secondary | ICD-10-CM

## 2019-02-28 DIAGNOSIS — M545 Low back pain: Secondary | ICD-10-CM | POA: Diagnosis not present

## 2019-02-28 DIAGNOSIS — M5416 Radiculopathy, lumbar region: Secondary | ICD-10-CM | POA: Diagnosis not present

## 2019-02-28 DIAGNOSIS — Z6829 Body mass index (BMI) 29.0-29.9, adult: Secondary | ICD-10-CM | POA: Diagnosis not present

## 2019-03-06 ENCOUNTER — Other Ambulatory Visit: Payer: Self-pay

## 2019-03-06 ENCOUNTER — Ambulatory Visit (INDEPENDENT_AMBULATORY_CARE_PROVIDER_SITE_OTHER): Payer: Federal, State, Local not specified - PPO | Admitting: Family Medicine

## 2019-03-06 ENCOUNTER — Encounter: Payer: Self-pay | Admitting: Family Medicine

## 2019-03-06 VITALS — BP 110/64 | HR 74 | Temp 96.9°F | Resp 16 | Ht 62.0 in | Wt 161.6 lb

## 2019-03-06 DIAGNOSIS — E785 Hyperlipidemia, unspecified: Secondary | ICD-10-CM

## 2019-03-06 DIAGNOSIS — Z01419 Encounter for gynecological examination (general) (routine) without abnormal findings: Secondary | ICD-10-CM

## 2019-03-06 DIAGNOSIS — E559 Vitamin D deficiency, unspecified: Secondary | ICD-10-CM | POA: Diagnosis not present

## 2019-03-06 DIAGNOSIS — K2100 Gastro-esophageal reflux disease with esophagitis, without bleeding: Secondary | ICD-10-CM

## 2019-03-06 DIAGNOSIS — Z79899 Other long term (current) drug therapy: Secondary | ICD-10-CM | POA: Diagnosis not present

## 2019-03-06 DIAGNOSIS — E538 Deficiency of other specified B group vitamins: Secondary | ICD-10-CM

## 2019-03-06 DIAGNOSIS — R739 Hyperglycemia, unspecified: Secondary | ICD-10-CM | POA: Diagnosis not present

## 2019-03-06 DIAGNOSIS — E663 Overweight: Secondary | ICD-10-CM

## 2019-03-06 MED ORDER — VITAMIN D (ERGOCALCIFEROL) 1.25 MG (50000 UNIT) PO CAPS: 50000.0000 [IU] | ORAL_CAPSULE | ORAL | 1 refills | Status: DC

## 2019-03-06 NOTE — Patient Instructions (Signed)

## 2019-03-06 NOTE — Progress Notes (Signed)
Name: Sara Randolph   MRN: [id number]    DOB: 1961/02/24   Date:03/06/2019       Progress Note  Subjective  Chief Complaint  Chief Complaint  Patient presents with  . Well woman exam  . Medication Refill    6 month F/U  . Hyperlipidemia  . Gastroesophageal Reflux  . Obesity  . B12 and Vitamin D deficiency    HPI   Patient presents for annual CPE and follow up  B12and Vitamin D deficiency: she is out of supplementation, we will recheck labs today . She has noticed some right hand tingling and sometimes leg ( but may be from radiculitis)   Overweight: she was obese but has changed her diet and is now overweight. She is not doing well with her diet, gained 9 lbs in the past 6 months  Dizziness: resolved after she stopped Effexor, no problems   Hyperlipidemia: taking Crestor and denies side effects, last lipid panel showed significant improvement , no side effects , continue current regiment   Pre-diabetes: she is on a low sugar diet, denies polyphagia, polydipsia or polyuria, seen by Dr. Gabriel Randolph, on life style modification only. We will recheck labs today   GERD: she is off Omeprazole again, she stopped taking ibuprofen and symptoms resolved, no heartburn or indigestion at this time  Low back pain with radiculitis: having symptoms for the past year, now seeing Neurolsurgeon and will have MRI next week    Diet: not as healthy lately, since COVID-19, she will try changing it soon, taking care of grandchildren Exercise: she needs to increase physical activity   USPSTF grade A and B recommendations    Office Visit from 03/06/2019 in [facility]  AUDIT-C Score  1     Hypertension: BP Readings from Last 3 Encounters:  03/06/19 110/64  09/25/18 102/64  08/31/18 120/80   Obesity: Wt Readings from Last 3 Encounters:  03/06/19 161 lb 9.6 oz (73.3 kg)  12/18/18 160 lb (72.6 kg)  09/25/18 155 lb 11.2 oz (70.6 kg)   BMI Readings from Last 3  Encounters:  03/06/19 29.56 kg/m  12/18/18 29.26 kg/m  09/25/18 28.48 kg/m    Hep C Screening: up to date  STD testing and prevention (HIV/chl/gon/syphilis): N/A Intimate partner violence: negative screen  Sexual History/Pain during Intercourse: no pain, no vaginal bleeding  Menstrual History/LMP/Abnormal Bleeding: s/p hysterectomy  Incontinence Symptoms: very mild symptoms when she holds for a prolonged period of time  Advanced Care Planning: A voluntary discussion about advance care planning including the explanation and discussion of advance directives.  Discussed health care proxy and Living will, and the patient was able to identify a health care proxy as daughter Sara Randolph   Patient does not have a living will at present time, needs to be notarized . If patient does have living will, I have requested they bring this to the clinic to be scanned in to their chart.   Breast cancer:  HM Mammogram  Date Value Ref Range Status  06/18/2014 normal  Final    BRCA gene screening: N/A Cervical cancer screening: s/p hysterectomy not for cancer  Osteoporosis Screening: normal in 2016, recheck next year    Lipids:  Lab Results  Component Value Date   CHOL 156 02/28/2018   CHOL 166 08/25/2017   CHOL 282 (H) 02/22/2017   Lab Results  Component Value Date   HDL 46 (L) 02/28/2018   HDL 51 08/25/2017   HDL 48 (L)  02/22/2017   Lab Results  Component Value Date   LDLCALC 85 02/28/2018   LDLCALC 92 08/25/2017   LDLCALC 198 (H) 02/22/2017   Lab Results  Component Value Date   TRIG 146 02/28/2018   TRIG 136 08/25/2017   TRIG 181 (H) 02/22/2017   Lab Results  Component Value Date   CHOLHDL 3.4 02/28/2018   CHOLHDL 3.3 08/25/2017   CHOLHDL 5.9 (H) 02/22/2017   No results found for: LDLDIRECT  Glucose:  Glucose, Bld  Date Value Ref Range Status  02/28/2018 104 (H) 65 - 99 mg/dL Final    Comment:    .            Fasting reference interval . For someone without  known diabetes, a glucose value between 100 and 125 mg/dL is consistent with prediabetes and should be confirmed with a follow-up test. .   08/25/2017 106 (H) 65 - 99 mg/dL Final    Comment:    .            Fasting reference interval . For someone without known diabetes, a glucose value between 100 and 125 mg/dL is consistent with prediabetes and should be confirmed with a follow-up test. .   02/22/2017 104 (H) 65 - 99 mg/dL Final    Skin cancer: discussed atypical lesions Colorectal cancer: 2015 repeat in 2025 Lung cancer:   Low Dose CT Chest recommended if Age 37-80 years, 30 pack-year currently smoking OR have quit w/in 15years. Patient does not qualify.   [id number]  Patient Active Problem List   Diagnosis Date Noted  . Lumbar back pain with radiculopathy affecting right lower extremity 08/31/2018  . Vitamin D deficiency 02/24/2017  . BPPV (benign paroxysmal positional vertigo), unspecified laterality 08/25/2016  . Peripheral vertigo 01/13/2015  . Dyslipidemia 01/13/2015  . Cold sore 01/13/2015  . Gastric reflux 01/13/2015  . Perennial allergic rhinitis 01/13/2015  . Menopausal symptom 01/13/2015  . History of concussion 01/18/2013    Past Surgical History:  Procedure Laterality Date  . ABDOMINAL HYSTERECTOMY  1988  . CHOLECYSTECTOMY    . DILATION AND CURETTAGE OF UTERUS    . ECTOPIC PREGNANCY SURGERY    . RHINOPLASTY  1990  . SALPINGECTOMY    . TONSILLECTOMY  1969  . WRIST SURGERY  1983    Family History  Problem Relation Age of Onset  . Alzheimer's disease Mother   . Parkinsonism Father   . Heart disease Brother   . Heart attack Brother   . Depression Sister   . Osteoporosis Sister     Social History   Socioeconomic History  . Marital status: Married    Spouse name: Sara Randolph  . Number of children: 2  . Years of education: Not on file  . Highest education level: Not on file  Occupational History  . Occupation: Sales executive  Social Needs  .  Financial resource strain: Not hard at all  . Food insecurity    Worry: Never true    Inability: Never true  . Transportation needs    Medical: No    Non-medical: No  Tobacco Use  . Smoking status: Never Smoker  . Smokeless tobacco: Never Used  Substance and Sexual Activity  . Alcohol use: Yes    Alcohol/week: 0.0 standard drinks    Comment: Consumes alcohol on Saturday  . Drug use: No  . Sexual activity: Yes    Partners: Male  Lifestyle  . Physical activity    Days per week: 7 days  Minutes per session: 10 min  . Stress: Only a little  Relationships  . Social connections    Talks on phone: More than three times a week    Gets together: Once a week    Attends religious service: Never    Active member of club or organization: No    Attends meetings of clubs or organizations: Never    Relationship status: Married  . Intimate partner violence    Fear of current or ex partner: No    Emotionally abused: No    Physically abused: No    Forced sexual activity: No  Other Topics Concern  . Not on file  Social History Narrative  . Not on file     Current Outpatient Medications:  .  acetaminophen (TYLENOL) 500 MG tablet, Take 1 tablet by mouth daily., Disp: , Rfl:  .  levocetirizine (XYZAL) 5 MG tablet, Take 2.5 mg by mouth every evening. , Disp: , Rfl:  .  Multiple Vitamin (MULTIVITAMIN) tablet, Take 1 tablet by mouth daily., Disp: , Rfl:  .  omeprazole (PRILOSEC) 40 MG capsule, Take 40 mg by mouth daily as needed., Disp: , Rfl:  .  rosuvastatin (CRESTOR) 5 MG tablet, Take 1 tablet (5 mg total) by mouth daily., Disp: 90 tablet, Rfl: 3 .  SYMBICORT 80-4.5 MCG/ACT inhaler, Inhale 2 puffs into the lungs 2 (two) times daily., Disp: , Rfl:  .  valACYclovir (VALTREX) 1000 MG tablet, Take 1 tablet by mouth twice daily, Disp: 60 tablet, Rfl: 0 .  Vitamin D, Ergocalciferol, (DRISDOL) 1.25 MG (50000 UT) CAPS capsule, Take 1 capsule (50,000 Units total) by mouth once a week., Disp: 12  capsule, Rfl: 1 .  predniSONE (STERAPRED UNI-PAK 48 TAB) 5 MG (48) TBPK tablet, Take as directed (Patient not taking: Reported on 03/06/2019), Disp: 48 tablet, Rfl: 0  Allergies  Allergen Reactions  . Atorvastatin     Muscle ache  . Pravastatin Itching    dizziness  . Venlafaxine Other (See Comments)    dizziness  . Hydrocodone Nausea Only     ROS  Constitutional: Negative for fever , positive for weight change.  Respiratory: Negative for cough and shortness of breath.   Cardiovascular: Negative for chest pain or palpitations.  Gastrointestinal: Negative for abdominal pain, no bowel changes.  Musculoskeletal: Negative for gait problem or joint swelling.  Skin: Negative for rash.  Neurological: Negative for dizziness or headache.  No other specific complaints in a complete review of systems (except as listed in HPI above).   Objective  Vitals:   03/06/19 0853  BP: 110/64  Pulse: 74  Resp: 16  Temp: (!) 96.9 F (36.1 C)  TempSrc: Temporal  SpO2: 98%  Weight: 161 lb 9.6 oz (73.3 kg)  Height: '5\' 2"'  (1.575 m)    Body mass index is 29.56 kg/m.  Physical Exam  Constitutional: Patient appears well-developed and well-nourished. No distress.  HENT: Head: Normocephalic and atraumatic. Ears: B TMs ok, no erythema or effusion; Nose: Nose normal. Mouth/Throat: Oropharynx is clear and moist. No oropharyngeal exudate.  Eyes: Conjunctivae and EOM are normal. Pupils are equal, round, and reactive to light. No scleral icterus.  Neck: Normal range of motion. Neck supple. No JVD present. No thyromegaly present.  Cardiovascular: Normal rate, regular rhythm and normal heart sounds.  No murmur heard. No BLE edema. Pulmonary/Chest: Effort normal and breath sounds normal. No respiratory distress. Abdominal: Soft. Bowel sounds are normal, no distension. There is no tenderness. no masses Breast: no  lumps or masses, no nipple discharge or rashes FEMALE GENITALIA:  Not done RECTAL: not done   Musculoskeletal: Normal range of motion, no joint effusions. No gross deformities Neurological: he is alert and oriented to person, place, and time. No cranial nerve deficit. Coordination, balance, strength, speech and gait are normal.  Skin: Skin is warm and dry. No rash noted. No erythema.  Psychiatric: Patient has a normal mood and affect. behavior is normal. Judgment and thought content normal.  PHQ2/9: Depression screen [facility] 2/9 03/06/2019 12/18/2018 09/25/2018 04/28/2018 02/28/2018  Decreased Interest 0 0 0 0 0  Down, Depressed, Hopeless 0 0 0 0 0  PHQ - 2 Score 0 0 0 0 0  Altered sleeping 3 0 - - 0  Tired, decreased energy 1 0 - - 0  Change in appetite 0 0 - - 0  Feeling bad or failure about yourself  0 0 - - 0  Trouble concentrating 0 0 - - 0  Moving slowly or fidgety/restless 0 0 - - 0  Suicidal thoughts 0 0 - - -  PHQ-9 Score 4 0 - - 0  Difficult doing work/chores Not difficult at all Not difficult at all - - Not difficult at all     Fall Risk: Fall Risk  03/06/2019 12/18/2018 09/25/2018 04/28/2018 04/28/2018  Falls in the past year? 0 0 0 - Yes  Number falls in past yr: 0 0 0 - 1  Comment - - - - fell over cat; felt dizzy/ vertigo when taking venlafaxine which she has now stopped  Injury with Fall? 0 0 0 (No Data) Yes  Comment - - - concussion -  Follow up - - - - -     Functional Status Survey: Is the patient deaf or have difficulty hearing?: No Does the patient have difficulty seeing, even when wearing glasses/contacts?: Yes Does the patient have difficulty concentrating, remembering, or making decisions?: No Does the patient have difficulty walking or climbing stairs?: No Does the patient have difficulty dressing or bathing?: No Does the patient have difficulty doing errands alone such as visiting a doctor's office or shopping?: No   Assessment & Plan  1. Dyslipidemia  - Lipid panel  2. Gastroesophageal reflux disease with esophagitis   3. Hyperglycemia  -  Hemoglobin A1c  4. Vitamin D deficiency  - VITAMIN D 25 Hydroxy (Vit-D Deficiency, Fractures) - Vitamin D, Ergocalciferol, (DRISDOL) 1.25 MG (50000 UT) CAPS capsule; Take 1 capsule (50,000 Units total) by mouth once a week.  Dispense: 12 capsule; Refill: 1  5. Overweight (BMI 25.0-29.9)   6. B12 deficiency  - B12 and Folate Panel - CBC with Differential/Platelet  7. Long-term use of high-risk medication  - COMPLETE METABOLIC PANEL WITH GFR  8. Well woman exam   -USPSTF grade A and B recommendations reviewed with patient; age-appropriate recommendations, preventive care, screening tests, etc discussed and encouraged; healthy living encouraged; see AVS for patient education given to patient -Discussed importance of 150 minutes of physical activity weekly, eat two servings of fish weekly, eat one serving of tree nuts ( cashews, pistachios, pecans, almonds.Marland Kitchen) every other day, eat 6 servings of fruit/vegetables daily and drink plenty of water and avoid sweet beverages.

## 2019-03-07 LAB — LIPID PANEL
Cholesterol: 187 mg/dL (ref <200)
HDL: 48 mg/dL — ABNORMAL LOW (ref >=50)
LDL Cholesterol (Calc): 110 mg/dL (calc) — ABNORMAL HIGH
Non-HDL Cholesterol (Calc): 139 mg/dL (calc) — ABNORMAL HIGH (ref <130)
Total CHOL/HDL Ratio: 3.9 (calc) (ref <5.0)
Triglycerides: 172 mg/dL — ABNORMAL HIGH (ref <150)

## 2019-03-07 LAB — CBC WITH DIFFERENTIAL/PLATELET
Absolute Monocytes: 325 cells/uL (ref 200–950)
Basophils Absolute: 39 cells/uL (ref 0–200)
Basophils Relative: 0.7 %
Eosinophils Absolute: 61 cells/uL (ref 15–500)
Eosinophils Relative: 1.1 %
HCT: 42.7 % (ref 35.0–45.0)
Hemoglobin: 14.3 g/dL (ref 11.7–15.5)
Lymphs Abs: 1595 cells/uL (ref 850–3900)
MCH: 29.2 pg (ref 27.0–33.0)
MCHC: 33.5 g/dL (ref 32.0–36.0)
MCV: 87.1 fL (ref 80.0–100.0)
MPV: 10.5 fL (ref 7.5–12.5)
Monocytes Relative: 5.9 %
Neutro Abs: 3482 cells/uL (ref 1500–7800)
Neutrophils Relative %: 63.3 %
Platelets: 308 10*3/uL (ref 140–400)
RBC: 4.9 10*6/uL (ref 3.80–5.10)
RDW: 12.3 % (ref 11.0–15.0)
Total Lymphocyte: 29 %
WBC: 5.5 10*3/uL (ref 3.8–10.8)

## 2019-03-07 LAB — COMPLETE METABOLIC PANEL WITH GFR
AG Ratio: 1.7 (calc) (ref 1.0–2.5)
ALT: 16 U/L (ref 6–29)
AST: 18 U/L (ref 10–35)
Albumin: 4.5 g/dL (ref 3.6–5.1)
Alkaline phosphatase (APISO): 46 U/L (ref 37–153)
BUN: 13 mg/dL (ref 7–25)
CO2: 28 mmol/L (ref 20–32)
Calcium: 9.5 mg/dL (ref 8.6–10.4)
Chloride: 104 mmol/L (ref 98–110)
Creat: 0.71 mg/dL (ref 0.50–0.99)
GFR, Est African American: 107 mL/min/{1.73_m2} (ref >=60)
GFR, Est Non African American: 93 mL/min/{1.73_m2} (ref >=60)
Globulin: 2.6 g/dL (calc) (ref 1.9–3.7)
Glucose, Bld: 96 mg/dL (ref 65–99)
Potassium: 4.3 mmol/L (ref 3.5–5.3)
Sodium: 140 mmol/L (ref 135–146)
Total Bilirubin: 0.6 mg/dL (ref 0.2–1.2)
Total Protein: 7.1 g/dL (ref 6.1–8.1)

## 2019-03-07 LAB — HEMOGLOBIN A1C
Hgb A1c MFr Bld: 5.7 % of total Hgb — ABNORMAL HIGH (ref <5.7)
Mean Plasma Glucose: 117 (calc)
eAG (mmol/L): 6.5 (calc)

## 2019-03-07 LAB — VITAMIN D 25 HYDROXY (VIT D DEFICIENCY, FRACTURES): Vit D, 25-Hydroxy: 36 ng/mL (ref 30–100)

## 2019-03-07 LAB — B12 AND FOLATE PANEL
Folate: 19.4 ng/mL
Vitamin B-12: 553 pg/mL (ref 200–1100)

## 2019-03-13 ENCOUNTER — Other Ambulatory Visit: Payer: Self-pay | Admitting: Family Medicine

## 2019-03-13 DIAGNOSIS — Z1231 Encounter for screening mammogram for malignant neoplasm of breast: Secondary | ICD-10-CM

## 2019-03-16 ENCOUNTER — Ambulatory Visit
Admission: RE | Admit: 2019-03-16 | Discharge: 2019-03-16 | Disposition: A | Discharge status: Home / Self Care | Payer: Federal, State, Local not specified - PPO | Source: Ambulatory Visit | Attending: Family Medicine | Admitting: Family Medicine

## 2019-03-16 DIAGNOSIS — Z1231 Encounter for screening mammogram for malignant neoplasm of breast: Secondary | ICD-10-CM

## 2019-03-20 DIAGNOSIS — M4807 Spinal stenosis, lumbosacral region: Secondary | ICD-10-CM | POA: Diagnosis not present

## 2019-03-20 DIAGNOSIS — M47817 Spondylosis without myelopathy or radiculopathy, lumbosacral region: Secondary | ICD-10-CM | POA: Diagnosis not present

## 2019-03-20 DIAGNOSIS — M5416 Radiculopathy, lumbar region: Secondary | ICD-10-CM | POA: Diagnosis not present

## 2019-03-20 DIAGNOSIS — M545 Low back pain: Secondary | ICD-10-CM | POA: Diagnosis not present

## 2019-03-20 DIAGNOSIS — M5127 Other intervertebral disc displacement, lumbosacral region: Secondary | ICD-10-CM | POA: Diagnosis not present

## 2019-03-20 DIAGNOSIS — Z6829 Body mass index (BMI) 29.0-29.9, adult: Secondary | ICD-10-CM | POA: Diagnosis not present

## 2019-04-10 DIAGNOSIS — K080 Exfoliation of teeth due to systemic causes: Secondary | ICD-10-CM | POA: Diagnosis not present

## 2019-04-13 ENCOUNTER — Other Ambulatory Visit: Payer: Self-pay | Admitting: Family Medicine

## 2019-04-13 DIAGNOSIS — B001 Herpesviral vesicular dermatitis: Secondary | ICD-10-CM

## 2019-05-20 ENCOUNTER — Telehealth: Payer: Federal, State, Local not specified - PPO | Admitting: Physician Assistant

## 2019-05-20 ENCOUNTER — Encounter: Payer: Self-pay | Admitting: Family Medicine

## 2019-05-20 DIAGNOSIS — J019 Acute sinusitis, unspecified: Secondary | ICD-10-CM

## 2019-05-20 MED ORDER — AMOXICILLIN-POT CLAVULANATE 875-125 MG PO TABS: 1.0000 | ORAL_TABLET | Freq: Two times a day (BID) | ORAL | 0 refills | Status: DC

## 2019-05-20 NOTE — Progress Notes (Signed)

## 2019-05-20 NOTE — Progress Notes (Signed)
I have spent 5 minutes in review of e-visit questionnaire, review and updating patient chart, medical decision making and response to patient.   Vashon Riordan Cody Jaydalyn Demattia, PA-C    

## 2019-05-22 ENCOUNTER — Ambulatory Visit: Payer: Self-pay | Admitting: Family Medicine

## 2019-06-15 ENCOUNTER — Other Ambulatory Visit: Payer: Self-pay

## 2019-06-15 DIAGNOSIS — Z20822 Contact with and (suspected) exposure to covid-19: Secondary | ICD-10-CM

## 2019-06-16 LAB — NOVEL CORONAVIRUS, NAA: SARS-CoV-2, NAA: NOT DETECTED

## 2019-07-05 DIAGNOSIS — K080 Exfoliation of teeth due to systemic causes: Secondary | ICD-10-CM | POA: Diagnosis not present

## 2019-07-16 ENCOUNTER — Other Ambulatory Visit: Payer: Self-pay

## 2019-07-16 DIAGNOSIS — Z20822 Contact with and (suspected) exposure to covid-19: Secondary | ICD-10-CM

## 2019-07-17 LAB — NOVEL CORONAVIRUS, NAA: SARS-CoV-2, NAA: NOT DETECTED

## 2019-08-09 ENCOUNTER — Ambulatory Visit (INDEPENDENT_AMBULATORY_CARE_PROVIDER_SITE_OTHER): Payer: Federal, State, Local not specified - PPO | Admitting: Family Medicine

## 2019-08-09 ENCOUNTER — Encounter: Payer: Self-pay | Admitting: Family Medicine

## 2019-08-09 VITALS — Temp 97.6°F | Wt 158.0 lb

## 2019-08-09 DIAGNOSIS — K2100 Gastro-esophageal reflux disease with esophagitis, without bleeding: Secondary | ICD-10-CM | POA: Diagnosis not present

## 2019-08-09 DIAGNOSIS — E559 Vitamin D deficiency, unspecified: Secondary | ICD-10-CM

## 2019-08-09 DIAGNOSIS — E785 Hyperlipidemia, unspecified: Secondary | ICD-10-CM | POA: Diagnosis not present

## 2019-08-09 DIAGNOSIS — M5410 Radiculopathy, site unspecified: Secondary | ICD-10-CM

## 2019-08-09 DIAGNOSIS — J3089 Other allergic rhinitis: Secondary | ICD-10-CM

## 2019-08-09 DIAGNOSIS — R739 Hyperglycemia, unspecified: Secondary | ICD-10-CM | POA: Diagnosis not present

## 2019-08-09 MED ORDER — GABAPENTIN 100 MG PO CAPS: 100.0000 mg | ORAL_CAPSULE | Freq: Every day | ORAL | 0 refills | Status: DC

## 2019-08-09 MED ORDER — CYCLOBENZAPRINE HCL 5 MG PO TABS: 5.0000 mg | ORAL_TABLET | Freq: Every day | ORAL | 0 refills | Status: DC

## 2019-08-09 NOTE — Progress Notes (Signed)
Name: Sara Randolph   MRN: [id number]    DOB: [birth year]   Date:08/09/2019       Progress Note  Subjective  Chief Complaint  Chief Complaint  Patient presents with  . Gastroesophageal Reflux  . Allergic Rhinitis   . Dyslipidemia    I connected with  Gaetano Hawthorne Freyre  on 08/09/19 at  7:40 AM EST by a video enabled telemedicine application and verified that I am speaking with the correct person using two identifiers.  I discussed the limitations of evaluation and management by telemedicine and the availability of in person appointments. The patient expressed understanding and agreed to proceed. Staff also discussed with the patient that there may be a patient responsible charge related to this service. Patient Location: at home  Provider Location: [facility]   HPI  B12and Vitamin D deficiency:last levels were normal, she has not been consistent at taking supplements except for a multivitamin, advised to resume at least Vitamin D 2000 units   Dizziness: resolved after she stopped Effexor, unchanged   Hyperlipidemia: taking Crestor and denies side effects, last lipid panel was not as good, LDL went up from 85 to 110, she states taking medication daily we will recheck this Summer  Pre-diabetes: she is on a low sugar diet, denies polyphagia, polydipsia or polyuria, seen by Dr. Gabriel Carina, on life style modification only. Last A1C from 03/2019 was 5.7% and stable  GERD: she isoff Omeprazole again, she is still not taking ibuprofen and symptoms resolved, no heartburn or indigestion at this time. She very seldom takes Aleve when pain is worse at night   Low back pain with radiculitis: going on for over one year, she has neurosurgeon Dr. Delice Lesch , she had a normal MRI in 2020 - unable to see the results. She states back pain resolved, but states still has right outer hip and right leg pain, that only bothers her at night. She states she used some PT for pyriforms  syndrome. She takes Tylenol at night , discussed adding a muscle relaxer and she is willing to try it, another option is Gabapentin or Lyrica . We will give her both and she will try it out and see what helps her more   Lung nodule: seen by Dr. Raul Del in the past and last CT was stable and released back in 2015    Patient Active Problem List   Diagnosis Date Noted  . Lumbar back pain with radiculopathy affecting right lower extremity 08/31/2018  . Vitamin D deficiency 02/24/2017  . BPPV (benign paroxysmal positional vertigo), unspecified laterality 08/25/2016  . Peripheral vertigo 01/13/2015  . Dyslipidemia 01/13/2015  . Cold sore 01/13/2015  . Gastric reflux 01/13/2015  . Perennial allergic rhinitis 01/13/2015  . Menopausal symptom 01/13/2015  . History of concussion 01/18/2013    Past Surgical History:  Procedure Laterality Date  . ABDOMINAL HYSTERECTOMY  1988  . CHOLECYSTECTOMY    . DILATION AND CURETTAGE OF UTERUS    . ECTOPIC PREGNANCY SURGERY    . RHINOPLASTY  1990  . SALPINGECTOMY    . TONSILLECTOMY  1969  . WRIST SURGERY  1983    Family History  Problem Relation Age of Onset  . Alzheimer's disease Mother   . Parkinsonism Father   . Heart disease Brother   . Heart attack Brother   . Depression Sister   . Osteoporosis Sister   . Breast cancer Neg Hx       Current Outpatient Medications:  .  acetaminophen (TYLENOL) 500 MG tablet, Take 1 tablet by mouth daily., Disp: , Rfl:  .  levocetirizine (XYZAL) 5 MG tablet, Take 2.5 mg by mouth every evening. , Disp: , Rfl:  .  Multiple Vitamin (MULTIVITAMIN) tablet, Take 1 tablet by mouth daily., Disp: , Rfl:  .  omeprazole (PRILOSEC) 40 MG capsule, Take 40 mg by mouth daily as needed., Disp: , Rfl:  .  rosuvastatin (CRESTOR) 5 MG tablet, Take 1 tablet (5 mg total) by mouth daily., Disp: 90 tablet, Rfl: 3 .  valACYclovir (VALTREX) 1000 MG tablet, Take 1 tablet by mouth twice daily, Disp: 30 tablet, Rfl: 0 .  Vitamin D,  Ergocalciferol, (DRISDOL) 1.25 MG (50000 UT) CAPS capsule, Take 1 capsule (50,000 Units total) by mouth once a week., Disp: 12 capsule, Rfl: 1 .  amoxicillin-clavulanate (AUGMENTIN) 875-125 MG tablet, Take 1 tablet by mouth 2 (two) times daily. (Patient not taking: Reported on 08/08/2019), Disp: 14 tablet, Rfl: 0  Allergies  Allergen Reactions  . Atorvastatin     Muscle ache  . Pravastatin Itching    dizziness  . Venlafaxine Other (See Comments)    dizziness  . Hydrocodone Nausea Only    I personally reviewed active problem list, medication list, allergies, family history, social history, health maintenance with the patient/caregiver today.   ROS  Ten systems reviewed and is negative except as mentioned in HPI   Objective  Virtual encounter, vitals not obtained.  There is no height or weight on file to calculate BMI.  Physical Exam  Awake, alert and oriented  PHQ2/9: Depression screen [facility] 2/9 03/06/2019 12/18/2018 09/25/2018 04/28/2018 02/28/2018  Decreased Interest 0 0 0 0 0  Down, Depressed, Hopeless 0 0 0 0 0  PHQ - 2 Score 0 0 0 0 0  Altered sleeping 3 0 - - 0  Tired, decreased energy 1 0 - - 0  Change in appetite 0 0 - - 0  Feeling bad or failure about yourself  0 0 - - 0  Trouble concentrating 0 0 - - 0  Moving slowly or fidgety/restless 0 0 - - 0  Suicidal thoughts 0 0 - - -  PHQ-9 Score 4 0 - - 0  Difficult doing work/chores Not difficult at all Not difficult at all - - Not difficult at all   PHQ-2/9 Result is negative.    Fall Risk: Fall Risk  03/06/2019 12/18/2018 09/25/2018 04/28/2018 04/28/2018  Falls in the past year? 0 0 0 - Yes  Number falls in past yr: 0 0 0 - 1  Comment - - - - fell over cat; felt dizzy/ vertigo when taking venlafaxine which she has now stopped  Injury with Fall? 0 0 0 (No Data) Yes  Comment - - - concussion -  Follow up - - - - -     Assessment & Plan  1. Radicular syndrome of right leg  - cyclobenzaprine (FLEXERIL) 5 MG tablet; Take  1-2 tablets (5-10 mg total) by mouth at bedtime.  Dispense: 30 tablet; Refill: 0 - gabapentin (NEURONTIN) 100 MG capsule; Take 1-3 capsules (100-300 mg total) by mouth at bedtime.  Dispense: 90 capsule; Refill: 0  2. Dyslipidemia  Continue medication   3. Gastroesophageal reflux disease with esophagitis without hemorrhage  Taking prn omeprazole, advised to switch to Pepcid  4. Hyperglycemia   5. Vitamin D deficiency  Resume vitamin D otc   6. Perennial allergic rhinitis  Continue Xyzal  I discussed the assessment and treatment plan with  the patient. The patient was provided an opportunity to ask questions and all were answered. The patient agreed with the plan and demonstrated an understanding of the instructions.  The patient was advised to call back or seek an in-person evaluation if the symptoms worsen or if the condition fails to improve as anticipated.  I provided 25  minutes of non-face-to-face time during this encounter.

## 2019-09-04 ENCOUNTER — Encounter: Payer: Self-pay | Admitting: Family Medicine

## 2019-09-04 ENCOUNTER — Other Ambulatory Visit: Payer: Self-pay | Admitting: Family Medicine

## 2019-09-04 DIAGNOSIS — M5410 Radiculopathy, site unspecified: Secondary | ICD-10-CM

## 2019-09-04 MED ORDER — GABAPENTIN 300 MG PO CAPS: 300.0000 mg | ORAL_CAPSULE | Freq: Every day | ORAL | 1 refills | Status: DC

## 2019-10-11 ENCOUNTER — Encounter: Payer: Self-pay | Admitting: Family Medicine

## 2019-10-23 ENCOUNTER — Other Ambulatory Visit: Payer: Self-pay | Admitting: Family Medicine

## 2019-10-23 DIAGNOSIS — B001 Herpesviral vesicular dermatitis: Secondary | ICD-10-CM

## 2019-10-23 NOTE — Telephone Encounter (Signed)
Requested Prescriptions  Pending Prescriptions Disp Refills  . valACYclovir (VALTREX) 1000 MG tablet [Pharmacy Med Name: valACYclovir HCl 1 GM Oral Tablet] 30 tablet 0    Sig: Take 1 tablet by mouth twice daily     Antimicrobials:  Antiviral Agents - Anti-Herpetic Passed - 10/23/2019  7:18 AM      Passed - Valid encounter within last 12 months    Recent Outpatient Visits          2 months ago Radicular syndrome of right leg   [facility] Steele Sizer, MD   7 months ago Dyslipidemia   [facility] [place], Drue Stager, MD   10 months ago Lumbar back pain with radiculopathy affecting right lower extremity   [place], FNP   1 year ago Sore throat   [place], [place], NP   1 year ago Dyslipidemia   [place] [facility] Steele Sizer, MD      Future Appointments            In 3 months Ancil Boozer, Drue Stager, MD [facility]

## 2019-10-31 ENCOUNTER — Telehealth: Payer: Self-pay

## 2019-10-31 NOTE — Telephone Encounter (Signed)
1st dose: March 1st [place] [id number] 2nd dose: March 22nd [place] [id number]

## 2019-10-31 NOTE — Telephone Encounter (Signed)
Copied from [place] [id number]. Topic: General - Inquiry >> Oct 31, 2019 10:11 AM Richardo Priest, NT wrote: Reason for CRM: Patient called in to inform office she has received covid-19 pfizer vaccine. Patient's second dose was given March 22nd. Please advise.

## 2019-11-07 ENCOUNTER — Other Ambulatory Visit: Payer: Self-pay | Admitting: Family Medicine

## 2019-11-07 DIAGNOSIS — E785 Hyperlipidemia, unspecified: Secondary | ICD-10-CM

## 2019-12-20 ENCOUNTER — Other Ambulatory Visit: Payer: Self-pay | Admitting: Family Medicine

## 2019-12-20 DIAGNOSIS — B001 Herpesviral vesicular dermatitis: Secondary | ICD-10-CM

## 2019-12-20 MED ORDER — VALACYCLOVIR HCL 1 G PO TABS: 1000.0000 mg | ORAL_TABLET | Freq: Two times a day (BID) | ORAL | 0 refills | Status: DC

## 2020-02-06 ENCOUNTER — Ambulatory Visit: Payer: Federal, State, Local not specified - PPO | Admitting: Family Medicine

## 2020-02-06 ENCOUNTER — Encounter: Payer: Self-pay | Admitting: Family Medicine

## 2020-02-06 ENCOUNTER — Other Ambulatory Visit: Payer: Self-pay

## 2020-02-06 VITALS — BP 100/70 | HR 71 | Temp 96.9°F | Resp 16 | Ht 62.0 in | Wt 148.3 lb

## 2020-02-06 DIAGNOSIS — M5416 Radiculopathy, lumbar region: Secondary | ICD-10-CM

## 2020-02-06 DIAGNOSIS — K219 Gastro-esophageal reflux disease without esophagitis: Secondary | ICD-10-CM

## 2020-02-06 DIAGNOSIS — E538 Deficiency of other specified B group vitamins: Secondary | ICD-10-CM | POA: Diagnosis not present

## 2020-02-06 DIAGNOSIS — R739 Hyperglycemia, unspecified: Secondary | ICD-10-CM

## 2020-02-06 DIAGNOSIS — E785 Hyperlipidemia, unspecified: Secondary | ICD-10-CM

## 2020-02-06 DIAGNOSIS — E559 Vitamin D deficiency, unspecified: Secondary | ICD-10-CM | POA: Diagnosis not present

## 2020-02-06 DIAGNOSIS — J3089 Other allergic rhinitis: Secondary | ICD-10-CM

## 2020-02-06 DIAGNOSIS — E663 Overweight: Secondary | ICD-10-CM

## 2020-02-06 MED ORDER — GABAPENTIN 300 MG PO CAPS: 300.0000 mg | ORAL_CAPSULE | Freq: Every day | ORAL | 1 refills | Status: DC

## 2020-02-06 NOTE — Progress Notes (Signed)
Name: Sara Randolph   MRN: [id number]    DOB: 1960-08-11   Date:02/06/2020       Progress Note  Subjective  Chief Complaint  Chief Complaint  Patient presents with  . Dyslipidemia  . Gastroesophageal Reflux  . Allergic Rhinitis   . Hyperglycemia  . Mass    She has a mass on her forehead from a fall that she had about 2-3 years ago. She has a sharp pain every once in awhile. She had a concussion when she fell and tripped over her cat.    HPI  B12and Vitamin D deficiency:she is taking otc supplementation, we will recheck labs   Hyperlipidemia: taking Crestor and denies side effects, last lipid panel was not as good, LDL went up from 85 to 110, she states taking medication daily also changed her diet and we will recheck labs today, lost 20 lbs in the past 3 months on [employer] diet   Pre-diabetes: she is on a low sugar diet, denies polyphagia, polydipsia or polyuria, seen by Dr. Gabriel Carina, on life style modification only.Last A1C from 03/2019 was 5.7% and stable, we will recheck labs   GERD: she isoff Omeprazole again, she states only having symptoms when she has a lot of citric meals or something like a margarita. No heartburn or indigestion lately   Low back pain with radiculitis:going on for over one year, she has neurosurgeon Dr. Delice Lesch , she had a normal MRI in 2020 - unable to see the results. She states back pain has improved since she changed mattress also lost weight. Still has some throbbing on right lateral hip and right knee, but doing much better now . She has been taking Gabapentin at night and needs a refill  Lung nodule: seen by Dr. Raul Del in the past and last CT was stable and released back in 2015 , no cough, sob or wheezing   Nodule forehead: she fell a few years ago and hit her forehead . She states since weight loss she noticed a nodule on the spot, at one time had some pain.   Overweight: she lost 20 lbs in the past 3 months with a Optavia diet , she is  feeling good. BMI down to 27.1   Patient Active Problem List   Diagnosis Date Noted  . Lumbar back pain with radiculopathy affecting right lower extremity 08/31/2018  . Vitamin D deficiency 02/24/2017  . BPPV (benign paroxysmal positional vertigo), unspecified laterality 08/25/2016  . Peripheral vertigo 01/13/2015  . Dyslipidemia 01/13/2015  . Cold sore 01/13/2015  . Gastric reflux 01/13/2015  . Perennial allergic rhinitis 01/13/2015  . Menopausal symptom 01/13/2015  . History of concussion 01/18/2013    Past Surgical History:  Procedure Laterality Date  . ABDOMINAL HYSTERECTOMY  1988  . CHOLECYSTECTOMY    . DILATION AND CURETTAGE OF UTERUS    . ECTOPIC PREGNANCY SURGERY    . RHINOPLASTY  1990  . SALPINGECTOMY    . TONSILLECTOMY  1969  . WRIST SURGERY  1983    Family History  Problem Relation Age of Onset  . Alzheimer's disease Mother   . Parkinsonism Father   . Heart disease Brother   . Heart attack Brother   . Depression Sister   . Osteoporosis Sister   . Breast cancer Neg Hx     Social History   Tobacco Use  . Smoking status: Never Smoker  . Smokeless tobacco: Never Used  Substance Use Topics  . Alcohol use: Yes  Alcohol/week: 0.0 standard drinks    Comment: Consumes alcohol on Saturday     Current Outpatient Medications:  .  acetaminophen (TYLENOL) 500 MG tablet, Take 1 tablet by mouth daily., Disp: , Rfl:  .  gabapentin (NEURONTIN) 300 MG capsule, Take 1 capsule (300 mg total) by mouth at bedtime., Disp: 90 capsule, Rfl: 1 .  levocetirizine (XYZAL) 5 MG tablet, Take 2.5 mg by mouth every evening. , Disp: , Rfl:  .  Multiple Vitamin (MULTIVITAMIN) tablet, Take 1 tablet by mouth daily., Disp: , Rfl:  .  rosuvastatin (CRESTOR) 5 MG tablet, Take 1 tablet by mouth once daily, Disp: 90 tablet, Rfl: 1 .  valACYclovir (VALTREX) 1000 MG tablet, Take 1 tablet (1,000 mg total) by mouth 2 (two) times daily., Disp: 30 tablet, Rfl: 0  Allergies  Allergen  Reactions  . Atorvastatin     Muscle ache  . Pravastatin Itching    dizziness  . Venlafaxine Other (See Comments)    dizziness  . Hydrocodone Nausea Only    I personally reviewed active problem list, medication list, allergies, family history, social history, health maintenance with the patient/caregiver today.   ROS  Constitutional: Negative for fever , positive for weight change.  Respiratory: Negative for cough and shortness of breath.   Cardiovascular: Negative for chest pain or palpitations.  Gastrointestinal: Negative for abdominal pain, no bowel changes.  Musculoskeletal: Negative for gait problem or joint swelling.  Skin: Negative for rash.  Neurological: Negative for dizziness or headache.  No other specific complaints in a complete review of systems (except as listed in HPI above).  Objective  Vitals:   02/06/20 0805  BP: 100/70  Pulse: 71  Resp: 16  Temp: (!) 96.9 F (36.1 C)  TempSrc: Temporal  SpO2: 98%  Weight: 148 lb 4.8 oz (67.3 kg)  Height: 5\' 2"  (1.575 m)    Body mass index is 27.12 kg/m.  Physical Exam  Constitutional: Patient appears well-developed and well-nourished. Overweight.  No distress.  HEENT: head atraumatic, normocephalic, pupils equal and reactive to light,  neck supple, small nodule on left forehead, likely calcification from old hematoma Cardiovascular: Normal rate, regular rhythm and normal heart sounds.  No murmur heard. No BLE edema. Pulmonary/Chest: Effort normal and breath sounds normal. No respiratory distress. Abdominal: Soft.  There is no tenderness. Muscular Skeletal: normal rom , negative straight leg raise Psychiatric: Patient has a normal mood and affect. behavior is normal. Judgment and thought content normal.  PHQ2/9: Depression screen [facility] 2/9 02/06/2020 08/09/2019 03/06/2019 12/18/2018 09/25/2018  Decreased Interest 0 0 0 0 0  Down, Depressed, Hopeless 0 0 0 0 0  PHQ - 2 Score 0 0 0 0 0  Altered sleeping 0 0 3 0 -   Tired, decreased energy 0 0 1 0 -  Change in appetite 0 0 0 0 -  Feeling bad or failure about yourself  0 0 0 0 -  Trouble concentrating 0 0 0 0 -  Moving slowly or fidgety/restless 0 0 0 0 -  Suicidal thoughts 0 0 0 0 -  PHQ-9 Score 0 0 4 0 -  Difficult doing work/chores - - Not difficult at all Not difficult at all -    phq 9 is negative   Fall Risk: Fall Risk  02/06/2020 08/09/2019 03/06/2019 12/18/2018 09/25/2018  Falls in the past year? 0 0 0 0 0  Number falls in past yr: 0 0 0 0 0  Comment - - - - -  Injury with Fall? 0 0 0 0 0  Comment - - - - -  Follow up - - - - -     Assessment & Plan   1. Dyslipidemia  - Lipid panel - COMPLETE METABOLIC PANEL WITH GFR  2. Vitamin D deficiency  - VITAMIN D 25 Hydroxy (Vit-D Deficiency, Fractures)  3. B12 deficiency  - CBC with Differential/Platelet - Vitamin B12  4. Hyperglycemia  - COMPLETE METABOLIC PANEL WITH GFR - Hemoglobin A1c  5. Gastroesophageal reflux disease without esophagitis  Doing well with life style modification   6. Overweight (BMI 25.0-29.9)  Doing well   7. Perennial allergic rhinitis  Doing well at this time  8. Lumbar back pain with radiculopathy affecting right lower extremity  - gabapentin (NEURONTIN) 300 MG capsule; Take 1 capsule (300 mg total) by mouth at bedtime.  Dispense: 90 capsule; Refill: 1

## 2020-02-07 ENCOUNTER — Encounter: Payer: Self-pay | Admitting: Family Medicine

## 2020-02-07 LAB — CBC WITH DIFFERENTIAL/PLATELET
Absolute Monocytes: 328 cells/uL (ref 200–950)
Basophils Absolute: 42 cells/uL (ref 0–200)
Basophils Relative: 0.8 %
Eosinophils Absolute: 151 cells/uL (ref 15–500)
Eosinophils Relative: 2.9 %
HCT: 44.2 % (ref 35.0–45.0)
Hemoglobin: 14.5 g/dL (ref 11.7–15.5)
Lymphs Abs: 1451 cells/uL (ref 850–3900)
MCH: 29.3 pg (ref 27.0–33.0)
MCHC: 32.8 g/dL (ref 32.0–36.0)
MCV: 89.3 fL (ref 80.0–100.0)
MPV: 10.6 fL (ref 7.5–12.5)
Monocytes Relative: 6.3 %
Neutro Abs: 3229 cells/uL (ref 1500–7800)
Neutrophils Relative %: 62.1 %
Platelets: 286 10*3/uL (ref 140–400)
RBC: 4.95 10*6/uL (ref 3.80–5.10)
RDW: 12.1 % (ref 11.0–15.0)
Total Lymphocyte: 27.9 %
WBC: 5.2 10*3/uL (ref 3.8–10.8)

## 2020-02-07 LAB — COMPLETE METABOLIC PANEL WITH GFR
AG Ratio: 1.7 (calc) (ref 1.0–2.5)
ALT: 14 U/L (ref 6–29)
AST: 14 U/L (ref 10–35)
Albumin: 4.5 g/dL (ref 3.6–5.1)
Alkaline phosphatase (APISO): 57 U/L (ref 37–153)
BUN: 19 mg/dL (ref 7–25)
CO2: 31 mmol/L (ref 20–32)
Calcium: 9.7 mg/dL (ref 8.6–10.4)
Chloride: 101 mmol/L (ref 98–110)
Creat: 0.73 mg/dL (ref 0.50–0.99)
GFR, Est African American: 103 mL/min/{1.73_m2} (ref >=60)
GFR, Est Non African American: 89 mL/min/{1.73_m2} (ref >=60)
Globulin: 2.6 g/dL (calc) (ref 1.9–3.7)
Glucose, Bld: 91 mg/dL (ref 65–99)
Potassium: 4.3 mmol/L (ref 3.5–5.3)
Sodium: 137 mmol/L (ref 135–146)
Total Bilirubin: 0.7 mg/dL (ref 0.2–1.2)
Total Protein: 7.1 g/dL (ref 6.1–8.1)

## 2020-02-07 LAB — HEMOGLOBIN A1C
Hgb A1c MFr Bld: 5.5 % of total Hgb (ref <5.7)
Mean Plasma Glucose: 111 (calc)
eAG (mmol/L): 6.2 (calc)

## 2020-02-07 LAB — VITAMIN D 25 HYDROXY (VIT D DEFICIENCY, FRACTURES): Vit D, 25-Hydroxy: 38 ng/mL (ref 30–100)

## 2020-02-07 LAB — LIPID PANEL
Cholesterol: 253 mg/dL — ABNORMAL HIGH (ref <200)
HDL: 52 mg/dL (ref >=50)
LDL Cholesterol (Calc): 180 mg/dL (calc) — ABNORMAL HIGH
Non-HDL Cholesterol (Calc): 201 mg/dL (calc) — ABNORMAL HIGH (ref <130)
Total CHOL/HDL Ratio: 4.9 (calc) (ref <5.0)
Triglycerides: 92 mg/dL (ref <150)

## 2020-02-07 LAB — VITAMIN B12: Vitamin B-12: 669 pg/mL (ref 200–1100)

## 2020-03-18 ENCOUNTER — Encounter: Payer: Self-pay | Admitting: Family Medicine

## 2020-04-25 NOTE — Progress Notes (Signed)
Name: Sara Randolph   MRN: [id number]    DOB: 09/16/1960   Date:04/28/2020       Progress Note  Subjective  Chief Complaint  Patient reports for advice on medication. She states Crestor produces muscle pain, headaches, dizziness, and overall lack of energy. She states she stopped medication in August due to these side effects.   HPI  B12and Vitamin D deficiency:last levels were normal, she has not been back on supplementation   Plantar fascitis: she noticed pain on left heel, she states taking ibuprofen prn , she states symptoms started ended of July and has been wearing new sandals, much worse since August. She described the pain as shooting and stabbing on there heel and proximal part of arch. Worse when she first stands up in am, gets better after some activity but worse again after rest   Low bp: it has always been normal, she states no orthostatic changes. No chest pain or palpitation   Hyperlipidemia:she has noticed muscle aches and fatigue with the medication and she stopped around August 2021 and symptoms resolved. Discussed switching Zetia.   Pre-diabetes: she is on a low sugar diet, denies polyphagia, polydipsia or polyuria, seen by Dr. Gabriel Carina, on life style modification only.A1C back in 2020  03/2019 was 5.7% but last one was 5.5 %  GERD: she isoff Omeprazole again, she is back on ibuprofen and advised her to try stopping medication again,  no heartburn or indigestion at this time.   Low back pain with radiculitis:going on for over one year, she has neurosurgeon Dr. Delice Lesch , she had a normal MRI in 2020 - unable to see the results. She states back pain resolved, she states still taking gabapentin prn only since doing better  No radiation   Lung nodule: seen by Dr. Raul Del in the past and last CT was stable and released back in 2015 . Unchanged   Perennial AR: she spent the day outdoors yesterday and was very congested yesterday but doing better now, lots of sneezing    Patient Active Problem List   Diagnosis Date Noted  . Lumbar back pain with radiculopathy affecting right lower extremity 08/31/2018  . Vitamin D deficiency 02/24/2017  . BPPV (benign paroxysmal positional vertigo), unspecified laterality 08/25/2016  . Peripheral vertigo 01/13/2015  . Dyslipidemia 01/13/2015  . Cold sore 01/13/2015  . Gastric reflux 01/13/2015  . Perennial allergic rhinitis 01/13/2015  . Menopausal symptom 01/13/2015  . History of concussion 01/18/2013    Past Surgical History:  Procedure Laterality Date  . ABDOMINAL HYSTERECTOMY  1988  . CHOLECYSTECTOMY    . DILATION AND CURETTAGE OF UTERUS    . ECTOPIC PREGNANCY SURGERY    . RHINOPLASTY  1990  . SALPINGECTOMY    . TONSILLECTOMY  1969  . WRIST SURGERY  1983    Family History  Problem Relation Age of Onset  . Alzheimer's disease Mother   . Parkinsonism Father   . Heart disease Brother   . Heart attack Brother   . Depression Sister   . Osteoporosis Sister   . Breast cancer Neg Hx     Social History   Tobacco Use  . Smoking status: Never Smoker  . Smokeless tobacco: Never Used  Substance Use Topics  . Alcohol use: Yes    Alcohol/week: 0.0 standard drinks    Comment: Consumes alcohol on Saturday     Current Outpatient Medications:  .  acetaminophen (TYLENOL) 500 MG tablet, Take 1 tablet by mouth daily., Disp: ,  Rfl:  .  gabapentin (NEURONTIN) 300 MG capsule, Take 1 capsule (300 mg total) by mouth at bedtime., Disp: 90 capsule, Rfl: 1 .  levocetirizine (XYZAL) 5 MG tablet, Take 2.5 mg by mouth every evening. , Disp: , Rfl:  .  Multiple Vitamin (MULTIVITAMIN) tablet, Take 1 tablet by mouth daily., Disp: , Rfl:  .  valACYclovir (VALTREX) 1000 MG tablet, Take 1 tablet (1,000 mg total) by mouth 2 (two) times daily., Disp: 30 tablet, Rfl: 0 .  Vitamin D, Ergocalciferol, (DRISDOL) 1.25 MG (50000 UNIT) CAPS capsule, Take 50,000 Units by mouth once a week., Disp: , Rfl:  .  ezetimibe (ZETIA) 10 MG  tablet, Take 1 tablet (10 mg total) by mouth daily., Disp: 90 tablet, Rfl: 0  Allergies  Allergen Reactions  . Crestor [Rosuvastatin]     Cause muscle cramps, dizziness, headache and overall fatigue.  . Atorvastatin     Muscle ache  . Pravastatin Itching    dizziness  . Venlafaxine Other (See Comments)    dizziness  . Hydrocodone Nausea Only    I personally reviewed active problem list, medication list, allergies, family history, social history, health maintenance with the patient/caregiver today.   ROS  Constitutional: Negative for fever or weight change.  Respiratory: Negative for cough and shortness of breath.   Cardiovascular: Negative for chest pain or palpitations.  Gastrointestinal: Negative for abdominal pain, no bowel changes.  Musculoskeletal: Negative for gait problem or joint swelling.  Skin: Negative for rash.  Neurological: Negative for dizziness or headache.  No other specific complaints in a complete review of systems (except as listed in HPI above).   Objective  Vitals:   04/28/20 1028  BP: 90/70  Pulse: 67  Resp: 14  Temp: 97.9 F (36.6 C)  TempSrc: Oral  SpO2: 99%  Weight: 143 lb 3.2 oz (65 kg)  Height: 5\' 2"  (1.575 m)    Body mass index is 26.19 kg/m.  Physical Exam  Constitutional: Patient appears well-developed and well-nourished. No distress.  HEENT: head atraumatic, normocephalic, pupils equal and reactive to light Cardiovascular: Normal rate, regular rhythm and normal heart sounds.  No murmur heard. No BLE edema. Pulmonary/Chest: Effort normal and breath sounds normal. No respiratory distress. Abdominal: Soft.  There is no tenderness. Psychiatric: Patient has a normal mood and affect. behavior is normal. Judgment and thought content normal.  Recent Results (from the past 2160 hour(s))  Lipid panel     Status: Abnormal   Collection Time: 02/06/20  8:56 AM  Result Value Ref Range   Cholesterol 253 (H) <200 mg/dL   HDL 52 > OR = 50  mg/dL   Triglycerides 92 <150 mg/dL   LDL Cholesterol (Calc) 180 (H) mg/dL (calc)    Comment: Reference range: <100 . Desirable range <100 mg/dL for primary prevention;   <70 mg/dL for patients with CHD or diabetic patients  with > or = 2 CHD risk factors. Marland Kitchen LDL-C is now calculated using the Martin-Hopkins  calculation, which is a validated novel method providing  better accuracy than the Friedewald equation in the  estimation of LDL-C.  Cresenciano Genre et al. Annamaria Helling. 3818;299(37): 2061-2068  (http://education.[facility].com/faq/FAQ164)    Total CHOL/HDL Ratio 4.9 <5.0 (calc)   Non-HDL Cholesterol (Calc) 201 (H) <130 mg/dL (calc)    Comment: For patients with diabetes plus 1 major ASCVD risk  factor, treating to a non-HDL-C goal of <100 mg/dL  (LDL-C of <70 mg/dL) is considered a therapeutic  option.   CBC with Differential/Platelet  Status: None   Collection Time: 02/06/20  8:56 AM  Result Value Ref Range   WBC 5.2 3.8 - 10.8 Thousand/uL   RBC 4.95 3.80 - 5.10 Million/uL   Hemoglobin 14.5 11.7 - 15.5 g/dL   HCT 44.2 35 - 45 %   MCV 89.3 80.0 - 100.0 fL   MCH 29.3 27.0 - 33.0 pg   MCHC 32.8 32.0 - 36.0 g/dL   RDW 12.1 11.0 - 15.0 %   Platelets 286 140 - 400 Thousand/uL   MPV 10.6 7.5 - 12.5 fL   Neutro Abs 3,229 1,500 - 7,800 cells/uL   Lymphs Abs 1,451 850 - 3,900 cells/uL   Absolute Monocytes 328 200 - 950 cells/uL   Eosinophils Absolute 151 15 - 500 cells/uL   Basophils Absolute 42 0 - 200 cells/uL   Neutrophils Relative % 62.1 %   Total Lymphocyte 27.9 %   Monocytes Relative 6.3 %   Eosinophils Relative 2.9 %   Basophils Relative 0.8 %  COMPLETE METABOLIC PANEL WITH GFR     Status: None   Collection Time: 02/06/20  8:56 AM  Result Value Ref Range   Glucose, Bld 91 65 - 99 mg/dL    Comment: .            Fasting reference interval .    BUN 19 7 - 25 mg/dL   Creat 0.73 0.50 - 0.99 mg/dL    Comment: For patients >79 years of age, the reference limit for  Creatinine is approximately 13% higher for people identified as African-American. .    GFR, Est Non African American 89 > OR = 60 mL/min/1.27m2   GFR, Est African American 103 > OR = 60 mL/min/1.25m2   BUN/Creatinine Ratio NOT APPLICABLE 6 - 22 (calc)   Sodium 137 135 - 146 mmol/L   Potassium 4.3 3.5 - 5.3 mmol/L   Chloride 101 98 - 110 mmol/L   CO2 31 20 - 32 mmol/L   Calcium 9.7 8.6 - 10.4 mg/dL   Total Protein 7.1 6.1 - 8.1 g/dL   Albumin 4.5 3.6 - 5.1 g/dL   Globulin 2.6 1.9 - 3.7 g/dL (calc)   AG Ratio 1.7 1.0 - 2.5 (calc)   Total Bilirubin 0.7 0.2 - 1.2 mg/dL   Alkaline phosphatase (APISO) 57 37 - 153 U/L   AST 14 10 - 35 U/L   ALT 14 6 - 29 U/L  Hemoglobin A1c     Status: None   Collection Time: 02/06/20  8:56 AM  Result Value Ref Range   Hgb A1c MFr Bld 5.5 <5.7 % of total Hgb    Comment: For the purpose of screening for the presence of diabetes: . <5.7%       Consistent with the absence of diabetes 5.7-6.4%    Consistent with increased risk for diabetes             (prediabetes) > or =6.5%  Consistent with diabetes . This assay result is consistent with a decreased risk of diabetes. . Currently, no consensus exists regarding use of hemoglobin A1c for diagnosis of diabetes in children. . According to American Diabetes Association (ADA) guidelines, hemoglobin A1c <7.0% represents optimal control in non-pregnant diabetic patients. Different metrics may apply to specific patient populations.  Standards of Medical Care in Diabetes(ADA). .    Mean Plasma Glucose 111 (calc)   eAG (mmol/L) 6.2 (calc)  VITAMIN D 25 Hydroxy (Vit-D Deficiency, Fractures)     Status: None   Collection Time:  02/06/20  8:56 AM  Result Value Ref Range   Vit D, 25-Hydroxy 38 30 - 100 ng/mL    Comment: Vitamin D Status         25-OH Vitamin D: . Deficiency:                    <20 ng/mL Insufficiency:             20 - 29 ng/mL Optimal:                 > or = 30 ng/mL . For 25-OH  Vitamin D testing on patients on  D2-supplementation and patients for whom quantitation  of D2 and D3 fractions is required, the [facility](TM) 25-OH VIT D, (D2,D3), LC/MS/MS is recommended: order  code [id number] (patients >52yrs). See Note 1 . Note 1 . For additional information, please refer to  http://education.[facility].com/faq/FAQ199  (This link is being provided for informational/ educational purposes only.)   Vitamin B12     Status: None   Collection Time: 02/06/20  8:56 AM  Result Value Ref Range   Vitamin B-12 669 200 - 1,100 pg/mL     PHQ2/9: Depression screen [facility] 2/9 04/28/2020 02/06/2020 08/09/2019 03/06/2019 12/18/2018  Decreased Interest 0 0 0 0 0  Down, Depressed, Hopeless 0 0 0 0 0  PHQ - 2 Score 0 0 0 0 0  Altered sleeping - 0 0 3 0  Tired, decreased energy - 0 0 1 0  Change in appetite - 0 0 0 0  Feeling bad or failure about yourself  - 0 0 0 0  Trouble concentrating - 0 0 0 0  Moving slowly or fidgety/restless - 0 0 0 0  Suicidal thoughts - 0 0 0 0  PHQ-9 Score - 0 0 4 0  Difficult doing work/chores - - - Not difficult at all Not difficult at all    phq 9 is negative   Fall Risk: Fall Risk  04/28/2020 02/06/2020 08/09/2019 03/06/2019 12/18/2018  Falls in the past year? 0 0 0 0 0  Number falls in past yr: 0 0 0 0 0  Comment - - - - -  Injury with Fall? 0 0 0 0 0  Comment - - - - -  Follow up - - - - -    Functional Status Survey: Is the patient deaf or have difficulty hearing?: No Does the patient have difficulty seeing, even when wearing glasses/contacts?: No Does the patient have difficulty concentrating, remembering, or making decisions?: No Does the patient have difficulty walking or climbing stairs?: No Does the patient have difficulty dressing or bathing?: No Does the patient have difficulty doing errands alone such as visiting a doctor's office or shopping?: No    Assessment & Plan  1. B12 deficiency  Continue  B12   2. Dyslipidemia  We  will try Zetia  3. Need for immunization against influenza  - Flu Vaccine QUAD 36+ mos IM  4. Hyperglycemia  Last A1C normalized with weight loss  5. Vitamin D deficiency  Continue supplementation   6. Gastroesophageal reflux disease without esophagitis  Controlled, needs to stop ibuprofen   7. Perennial allergic rhinitis  Flare yesterday  8. Plantar fasciitis, left  Discussed home exercise and ice

## 2020-04-28 ENCOUNTER — Encounter: Payer: Self-pay | Admitting: Family Medicine

## 2020-04-28 ENCOUNTER — Ambulatory Visit: Payer: Federal, State, Local not specified - PPO | Admitting: Family Medicine

## 2020-04-28 ENCOUNTER — Other Ambulatory Visit: Payer: Self-pay

## 2020-04-28 VITALS — BP 90/70 | HR 67 | Temp 97.9°F | Resp 14 | Ht 62.0 in | Wt 143.2 lb

## 2020-04-28 DIAGNOSIS — J3089 Other allergic rhinitis: Secondary | ICD-10-CM

## 2020-04-28 DIAGNOSIS — M722 Plantar fascial fibromatosis: Secondary | ICD-10-CM

## 2020-04-28 DIAGNOSIS — Z23 Encounter for immunization: Secondary | ICD-10-CM

## 2020-04-28 DIAGNOSIS — E559 Vitamin D deficiency, unspecified: Secondary | ICD-10-CM | POA: Diagnosis not present

## 2020-04-28 DIAGNOSIS — K219 Gastro-esophageal reflux disease without esophagitis: Secondary | ICD-10-CM

## 2020-04-28 DIAGNOSIS — E538 Deficiency of other specified B group vitamins: Secondary | ICD-10-CM

## 2020-04-28 DIAGNOSIS — E785 Hyperlipidemia, unspecified: Secondary | ICD-10-CM

## 2020-04-28 DIAGNOSIS — R739 Hyperglycemia, unspecified: Secondary | ICD-10-CM

## 2020-04-28 MED ORDER — EZETIMIBE 10 MG PO TABS: 10.0000 mg | ORAL_TABLET | Freq: Every day | ORAL | 0 refills | Status: DC

## 2020-04-28 NOTE — Patient Instructions (Addendum)
OOFOS sandals   Plantar Fasciitis Rehab Ask your health care provider which exercises are safe for you. Do exercises exactly as told by your health care provider and adjust them as directed. It is normal to feel mild stretching, pulling, tightness, or discomfort as you do these exercises. Stop right away if you feel sudden pain or your pain gets worse. Do not begin these exercises until told by your health care provider. Stretching and range-of-motion exercises These exercises warm up your muscles and joints and improve the movement and flexibility of your foot. These exercises also help to relieve pain. Plantar fascia stretch  1. Sit with your left / right leg crossed over your opposite knee. 2. Hold your heel with one hand with that thumb near your arch. With your other hand, hold your toes and gently pull them back toward the top of your foot. You should feel a stretch on the bottom of your toes or your foot (plantar fascia) or both. 3. Hold this stretch for__________ seconds. 4. Slowly release your toes and return to the starting position. Repeat __________ times. Complete this exercise __________ times a day. Gastrocnemius stretch, standing This exercise is also called a calf (gastroc) stretch. It stretches the muscles in the back of the upper calf. 1. Stand with your hands against a wall. 2. Extend your left / right leg behind you, and bend your front knee slightly. 3. Keeping your heels on the floor and your back knee straight, shift your weight toward the wall. Do not arch your back. You should feel a gentle stretch in your upper left / right calf. 4. Hold this position for __________ seconds. Repeat __________ times. Complete this exercise __________ times a day. Soleus stretch, standing This exercise is also called a calf (soleus) stretch. It stretches the muscles in the back of the lower calf. 1. Stand with your hands against a wall. 2. Extend your left / right leg behind you, and  bend your front knee slightly. 3. Keeping your heels on the floor, bend your back knee and shift your weight slightly over your back leg. You should feel a gentle stretch deep in your lower calf. 4. Hold this position for __________ seconds. Repeat __________ times. Complete this exercise __________ times a day. Gastroc and soleus stretch, standing step This exercise stretches the muscles in the back of the lower leg. These muscles are in the upper calf (gastrocnemius) and the lower calf (soleus). 1. Stand with the ball of your left / right foot on a step. The ball of your foot is on the walking surface, right under your toes. 2. Keep your other foot firmly on the same step. 3. Hold on to the wall or a railing for balance. 4. Slowly lift your other foot, allowing your body weight to press your left / right heel down over the edge of the step. You should feel a stretch in your left / right calf. 5. Hold this position for __________ seconds. 6. Return both feet to the step. 7. Repeat this exercise with a slight bend in your left / right knee. Repeat __________ times with your left / right knee straight and __________ times with your left / right knee bent. Complete this exercise __________ times a day. Balance exercise This exercise builds your balance and strength control of your arch to help take pressure off your plantar fascia. Single leg stand If this exercise is too easy, you can try it with your eyes closed or while standing  on a pillow. 1. Without shoes, stand near a railing or in a doorway. You may hold on to the railing or door frame as needed. 2. Stand on your left / right foot. Keep your big toe down on the floor and try to keep your arch lifted. Do not let your foot roll inward. 3. Hold this position for __________ seconds. Repeat __________ times. Complete this exercise __________ times a day. This information is not intended to replace advice given to you by your health care  provider. Make sure you discuss any questions you have with your health care provider. Document Revised: 11/09/2018 Document Reviewed: 05/17/2018 Elsevier Patient Education  [place].

## 2020-04-29 ENCOUNTER — Other Ambulatory Visit: Payer: Self-pay | Admitting: Family Medicine

## 2020-04-29 DIAGNOSIS — Z1231 Encounter for screening mammogram for malignant neoplasm of breast: Secondary | ICD-10-CM

## 2020-05-02 ENCOUNTER — Ambulatory Visit
Admission: RE | Admit: 2020-05-02 | Discharge: 2020-05-02 | Disposition: A | Discharge status: Home / Self Care | Payer: Federal, State, Local not specified - PPO | Source: Ambulatory Visit | Attending: Family Medicine | Admitting: Family Medicine

## 2020-05-02 ENCOUNTER — Other Ambulatory Visit: Payer: Self-pay

## 2020-05-02 DIAGNOSIS — Z1231 Encounter for screening mammogram for malignant neoplasm of breast: Secondary | ICD-10-CM

## 2020-05-05 ENCOUNTER — Other Ambulatory Visit: Payer: Self-pay | Admitting: Family Medicine

## 2020-05-05 DIAGNOSIS — R928 Other abnormal and inconclusive findings on diagnostic imaging of breast: Secondary | ICD-10-CM

## 2020-05-05 DIAGNOSIS — N6310 Unspecified lump in the right breast, unspecified quadrant: Secondary | ICD-10-CM

## 2020-05-14 ENCOUNTER — Other Ambulatory Visit: Payer: Self-pay

## 2020-05-14 ENCOUNTER — Ambulatory Visit
Admission: RE | Admit: 2020-05-14 | Discharge: 2020-05-14 | Disposition: A | Discharge status: Home / Self Care | Payer: Federal, State, Local not specified - PPO | Source: Ambulatory Visit | Attending: Family Medicine | Admitting: Family Medicine

## 2020-05-14 DIAGNOSIS — N6310 Unspecified lump in the right breast, unspecified quadrant: Secondary | ICD-10-CM | POA: Diagnosis not present

## 2020-05-14 DIAGNOSIS — N6489 Other specified disorders of breast: Secondary | ICD-10-CM | POA: Diagnosis not present

## 2020-05-14 DIAGNOSIS — R922 Inconclusive mammogram: Secondary | ICD-10-CM | POA: Diagnosis not present

## 2020-05-14 DIAGNOSIS — R928 Other abnormal and inconclusive findings on diagnostic imaging of breast: Secondary | ICD-10-CM

## 2020-05-15 ENCOUNTER — Other Ambulatory Visit: Payer: Self-pay | Admitting: Family Medicine

## 2020-05-15 DIAGNOSIS — R928 Other abnormal and inconclusive findings on diagnostic imaging of breast: Secondary | ICD-10-CM

## 2020-05-15 DIAGNOSIS — N6310 Unspecified lump in the right breast, unspecified quadrant: Secondary | ICD-10-CM

## 2020-05-17 ENCOUNTER — Encounter: Payer: Self-pay | Admitting: Family Medicine

## 2020-05-19 ENCOUNTER — Other Ambulatory Visit: Payer: Self-pay | Admitting: Family Medicine

## 2020-05-19 MED ORDER — DIAZEPAM 5 MG PO TABS: 2.5000 mg | ORAL_TABLET | Freq: Every evening | ORAL | 0 refills | Status: DC | PRN

## 2020-05-22 ENCOUNTER — Ambulatory Visit
Admission: RE | Admit: 2020-05-22 | Discharge: 2020-05-22 | Disposition: A | Discharge status: Home / Self Care | Payer: Federal, State, Local not specified - PPO | Source: Ambulatory Visit | Attending: Family Medicine | Admitting: Family Medicine

## 2020-05-22 ENCOUNTER — Other Ambulatory Visit: Payer: Self-pay

## 2020-05-22 ENCOUNTER — Other Ambulatory Visit: Payer: Self-pay | Admitting: Family Medicine

## 2020-05-22 DIAGNOSIS — N6310 Unspecified lump in the right breast, unspecified quadrant: Secondary | ICD-10-CM

## 2020-05-22 DIAGNOSIS — R928 Other abnormal and inconclusive findings on diagnostic imaging of breast: Secondary | ICD-10-CM | POA: Diagnosis not present

## 2020-05-23 ENCOUNTER — Other Ambulatory Visit: Payer: Self-pay | Admitting: Family Medicine

## 2020-05-23 DIAGNOSIS — R928 Other abnormal and inconclusive findings on diagnostic imaging of breast: Secondary | ICD-10-CM

## 2020-05-23 DIAGNOSIS — N6310 Unspecified lump in the right breast, unspecified quadrant: Secondary | ICD-10-CM

## 2020-06-04 NOTE — Patient Instructions (Signed)

## 2020-06-04 NOTE — Progress Notes (Signed)
Name: Sara Randolph   MRN: [id number]    DOB: 10-Jun-1961   Date:06/05/2020       Progress Note  Subjective  Chief Complaint  CPE  HPI  Patient presents for annual CPE and follow up  Nodule forehead: she fell a few years ago and hit her forehead . She states since weight loss she noticed a nodule on the spot, at one time had some pain.She will see Dermatologist - Dr. Nicole Kindred  in January 2022  Overweight: she lost 25 lbs in the past 4 months with a [place] ,  it was 161 lbs back in 03/2020 weight loss of 24 lbs since last year - but she states when she started diet in April it was up to 166 lbs , she is feeling good, but with abnormal mammogram, she should try not to lose to much weight.    Abnormal mammogram: right side: she went to [facility] on 05/22/2020 to have a biopsy, however had a vagal response with nausea, diaphoresis and dizzy, she was taken off the table and when they placed her back the radiologist was not able to find the mass. She is going to wait for 3 months to have it done again.   Hyperlipidemia: she could not tolerate statins, but is taking Zetia, last LDL was 180, family history of hyperlipidemia  Diet: following Octavia diet  Exercise: taking care of grandchildren     Office Visit from 06/05/2020 in [facility]  AUDIT-C Score 0     Depression: Phq 9 is  negative Depression screen [facility] 2/9 06/05/2020 06/05/2020 04/28/2020 02/06/2020 08/09/2019  Decreased Interest 0 0 0 0 0  Down, Depressed, Hopeless 0 0 0 0 0  PHQ - 2 Score 0 0 0 0 0  Altered sleeping 3 - - 0 0  Tired, decreased energy 3 - - 0 0  Change in appetite 0 - - 0 0  Feeling bad or failure about yourself  0 - - 0 0  Trouble concentrating 0 - - 0 0  Moving slowly or fidgety/restless 0 - - 0 0  Suicidal thoughts 0 - - 0 0  PHQ-9 Score 6 - - 0 0  Difficult doing work/chores Not difficult at all - - - -   Hypertension: BP Readings from Last 3 Encounters:  06/05/20 100/68   04/28/20 90/70  02/06/20 100/70   Obesity: Wt Readings from Last 3 Encounters:  06/05/20 137 lb 11.2 oz (62.5 kg)  04/28/20 143 lb 3.2 oz (65 kg)  02/06/20 148 lb 4.8 oz (67.3 kg)   BMI Readings from Last 3 Encounters:  06/05/20 25.19 kg/m  04/28/20 26.19 kg/m  02/06/20 27.12 kg/m     Vaccines:   Shingrix: we will get records  Pneumonia:  educated and discussed with patient. Flu: 04/28/20 educated and discussed with patient.  Hep C Screening: 12/05/2013 STD testing and prevention (HIV/chl/gon/syphilis): Not interested  Intimate partner violence: negative screen  Sexual History : no pain  Menstrual History/LMP/Abnormal Bleeding: discussed post-menopausal bleeding  Incontinence Symptoms: she has mild stress incontinence when she has full bladder   Breast cancer:  - Last Mammogram: 05/02/2020 - BRCA gene screening: 10/03/2014  Osteoporosis: Discussed high calcium and vitamin D supplementation, weight bearing exercises  Cervical cancer screening: N/A  Skin cancer: Discussed monitoring for atypical lesions  Colorectal cancer: 10/08/2013   Lung cancer:  Low Dose CT Chest recommended if Age 79-80 years, 20 pack-year currently smoking OR have quit w/in 15years. Patient  does not qualify.   ECG: 06/10/2010   Advanced Care Planning: A voluntary discussion about advance care planning including the explanation and discussion of advance directives.  Discussed health care proxy and Living will, and the patient was able to identify a health care proxy as husband   Patient does not have a living will at present time.  Lipids: Lab Results  Component Value Date   CHOL 253 (H) 02/06/2020   CHOL 187 03/06/2019   CHOL 156 02/28/2018   Lab Results  Component Value Date   HDL 52 02/06/2020   HDL 48 (L) 03/06/2019   HDL 46 (L) 02/28/2018   Lab Results  Component Value Date   LDLCALC 180 (H) 02/06/2020   LDLCALC 110 (H) 03/06/2019   LDLCALC 85 02/28/2018   Lab Results  Component  Value Date   TRIG 92 02/06/2020   TRIG 172 (H) 03/06/2019   TRIG 146 02/28/2018   Lab Results  Component Value Date   CHOLHDL 4.9 02/06/2020   CHOLHDL 3.9 03/06/2019   CHOLHDL 3.4 02/28/2018   No results found for: LDLDIRECT  Glucose: Glucose, Bld  Date Value Ref Range Status  02/06/2020 91 65 - 99 mg/dL Final    Comment:    .            Fasting reference interval .   03/06/2019 96 65 - 99 mg/dL Final    Comment:    .            Fasting reference interval .   02/28/2018 104 (H) 65 - 99 mg/dL Final    Comment:    .            Fasting reference interval . For someone without known diabetes, a glucose value between 100 and 125 mg/dL is consistent with prediabetes and should be confirmed with a follow-up test. .     Patient Active Problem List   Diagnosis Date Noted   Lumbar back pain with radiculopathy affecting right lower extremity 08/31/2018   Vitamin D deficiency 02/24/2017   BPPV (benign paroxysmal positional vertigo), unspecified laterality 08/25/2016   Peripheral vertigo 01/13/2015   Dyslipidemia 01/13/2015   Cold sore 01/13/2015   Gastric reflux 01/13/2015   Perennial allergic rhinitis 01/13/2015   Menopausal symptom 01/13/2015   History of concussion 01/18/2013    Past Surgical History:  Procedure Laterality Date   ABDOMINAL HYSTERECTOMY  1988   CHOLECYSTECTOMY     DILATION AND CURETTAGE OF UTERUS     ECTOPIC PREGNANCY SURGERY     RHINOPLASTY  1990   SALPINGECTOMY     TONSILLECTOMY  1969   WRIST SURGERY  1983    Family History  Problem Relation Age of Onset   Alzheimer's disease Mother    Parkinsonism Father    Heart disease Brother    Heart attack Brother    Depression Sister    Osteoporosis Sister    Breast cancer Neg Hx     Social History   Socioeconomic History   Marital status: Married    Spouse name: Buddy   Number of children: 2   Years of education: Not on file   Highest education level:  Not on file  Occupational History   Occupation: Sales executive  Tobacco Use   Smoking status: Never Smoker   Smokeless tobacco: Never Used  Scientific laboratory technician Use: Never used  Substance and Sexual Activity   Alcohol use: Yes    Alcohol/week: 0.0 standard drinks  Comment: Consumes alcohol on Saturday   Drug use: No   Sexual activity: Yes    Partners: Male  Other Topics Concern   Not on file  Social History Narrative   Not on file   Social Determinants of Health   Financial Resource Strain: Low Risk    Difficulty of Paying Living Expenses: Not hard at all  Food Insecurity: No Food Insecurity   Worried About Charity fundraiser in the Last Year: Never true   Badger in the Last Year: Never true  Transportation Needs: No Transportation Needs   Lack of Transportation (Medical): No   Lack of Transportation (Non-Medical): No  Physical Activity: Inactive   Days of Exercise per Week: 0 days   Minutes of Exercise per Session: 0 min  Stress: No Stress Concern Present   Feeling of Stress : Not at all  Social Connections: Moderately Isolated   Frequency of Communication with Friends and Family: More than three times a week   Frequency of Social Gatherings with Friends and Family: More than three times a week   Attends Religious Services: Never   Marine scientist or Organizations: No   Attends Music therapist: Never   Marital Status: Married  Human resources officer Violence: Not At Risk   Fear of Current or Ex-Partner: No   Emotionally Abused: No   Physically Abused: No   Sexually Abused: No     Current Outpatient Medications:    acetaminophen (TYLENOL) 500 MG tablet, Take 1 tablet by mouth daily., Disp: , Rfl:    diazepam (VALIUM) 5 MG tablet, Take 0.5-1 tablets (2.5-5 mg total) by mouth at bedtime as needed for anxiety., Disp: 5 tablet, Rfl: 0   ezetimibe (ZETIA) 10 MG tablet, Take 1 tablet (10 mg total) by mouth daily.,  Disp: 90 tablet, Rfl: 0   gabapentin (NEURONTIN) 300 MG capsule, Take 1 capsule (300 mg total) by mouth at bedtime., Disp: 90 capsule, Rfl: 1   levocetirizine (XYZAL) 5 MG tablet, Take 2.5 mg by mouth every evening. , Disp: , Rfl:    Multiple Vitamin (MULTIVITAMIN) tablet, Take 1 tablet by mouth daily., Disp: , Rfl:    valACYclovir (VALTREX) 1000 MG tablet, Take 1 tablet (1,000 mg total) by mouth 2 (two) times daily., Disp: 30 tablet, Rfl: 0   Vitamin D, Ergocalciferol, (DRISDOL) 1.25 MG (50000 UNIT) CAPS capsule, Take 50,000 Units by mouth once a week., Disp: , Rfl:   Allergies  Allergen Reactions   Crestor [Rosuvastatin]     Cause muscle cramps, dizziness, headache and overall fatigue.   Atorvastatin     Muscle ache   Pravastatin Itching    dizziness   Venlafaxine Other (See Comments)    dizziness   Hydrocodone Nausea Only     ROS  Constitutional: Negative for fever , positive for weight change.  Respiratory: Negative for cough and shortness of breath.   Cardiovascular: Negative for chest pain or palpitations.  Gastrointestinal: Negative for abdominal pain, no bowel changes.  Musculoskeletal: Negative for gait problem or joint swelling.  Skin: Negative for rash.  Neurological: Negative for dizziness or headache.  No other specific complaints in a complete review of systems (except as listed in HPI above).  Objective  Vitals:   06/05/20 0815  BP: 100/68  Pulse: 77  Resp: 16  Temp: 97.9 F (36.6 C)  TempSrc: Oral  SpO2: 98%  Weight: 137 lb 11.2 oz (62.5 kg)  Height: '5\' 2"'  (1.575 m)  Body mass index is 25.19 kg/m.  Physical Exam  Constitutional: Patient appears well-developed and well-nourished. No distress.  HENT: Head: Normocephalic and atraumatic. Ears: B TMs ok, no erythema or effusion; Nose: Not done . Mouth/Throat: not done  Eyes: Conjunctivae and EOM are normal. Pupils are equal, round, and reactive to light. No scleral icterus.  Neck: Normal  range of motion. Neck supple. No JVD present. No thyromegaly present.  Cardiovascular: Normal rate, regular rhythm and normal heart sounds.  No murmur heard. No BLE edema. Pulmonary/Chest: Effort normal and breath sounds normal. No respiratory distress. Abdominal: Soft. Bowel sounds are normal, no distension. There is no tenderness. no masses Breast: no lumps or masses, no nipple discharge or rashes FEMALE GENITALIA:  Not done  RECTAL: not done  Musculoskeletal: Normal range of motion, no joint effusions. No gross deformities Neurological: he is alert and oriented to person, place, and time. No cranial nerve deficit. Coordination, balance, strength, speech and gait are normal.  Skin: Skin is warm and dry. No rash noted. No erythema.  Psychiatric: Patient has a normal mood and affect. behavior is normal. Judgment and thought content normal.  Fall Risk: Fall Risk  04/28/2020 02/06/2020 08/09/2019 03/06/2019 12/18/2018  Falls in the past year? 0 0 0 0 0  Number falls in past yr: 0 0 0 0 0  Comment - - - - -  Injury with Fall? 0 0 0 0 0  Comment - - - - -  Follow up - - - - -     Functional Status Survey: Is the patient deaf or have difficulty hearing?: No Does the patient have difficulty seeing, even when wearing glasses/contacts?: No Does the patient have difficulty concentrating, remembering, or making decisions?: No Does the patient have difficulty walking or climbing stairs?: No Does the patient have difficulty dressing or bathing?: No Does the patient have difficulty doing errands alone such as visiting a doctor's office or shopping?: No   Assessment & Plan  1. Well adult exam   2. Abnormal mammogram of right breast  - C-reactive protein - Sedimentation rate - CBC with Differential/Platelet  3. Weight loss  We will refer her to a surgeon if sed rate, CRP or if she has anemia, instead of waiting for biopsy in January  - C-reactive protein - Sedimentation rate - TSH - CBC  with Differential/Platelet - COMPLETE METABOLIC PANEL WITH GFR  4. Hypercholesteremia  - Lipid panel  5. Plantar fasciitis, left  Seeing Dr. Amalia Hailey next week, she is wearing a arch support   -USPSTF grade A and B recommendations reviewed with patient; age-appropriate recommendations, preventive care, screening tests, etc discussed and encouraged; healthy living encouraged; see AVS for patient education given to patient -Discussed importance of 150 minutes of physical activity weekly, eat two servings of fish weekly, eat one serving of tree nuts ( cashews, pistachios, pecans, almonds.Marland Kitchen) every other day, eat 6 servings of fruit/vegetables daily and drink plenty of water and avoid sweet beverages.

## 2020-06-05 ENCOUNTER — Other Ambulatory Visit: Payer: Self-pay

## 2020-06-05 ENCOUNTER — Other Ambulatory Visit: Payer: Self-pay | Admitting: Family Medicine

## 2020-06-05 ENCOUNTER — Encounter: Payer: Self-pay | Admitting: Family Medicine

## 2020-06-05 ENCOUNTER — Ambulatory Visit (INDEPENDENT_AMBULATORY_CARE_PROVIDER_SITE_OTHER): Payer: Federal, State, Local not specified - PPO | Admitting: Family Medicine

## 2020-06-05 VITALS — BP 100/68 | HR 77 | Temp 97.9°F | Resp 16 | Ht 62.0 in | Wt 137.7 lb

## 2020-06-05 DIAGNOSIS — R928 Other abnormal and inconclusive findings on diagnostic imaging of breast: Secondary | ICD-10-CM | POA: Diagnosis not present

## 2020-06-05 DIAGNOSIS — R634 Abnormal weight loss: Secondary | ICD-10-CM

## 2020-06-05 DIAGNOSIS — E7800 Pure hypercholesterolemia, unspecified: Secondary | ICD-10-CM | POA: Diagnosis not present

## 2020-06-05 DIAGNOSIS — Z0000 Encounter for general adult medical examination without abnormal findings: Secondary | ICD-10-CM

## 2020-06-05 DIAGNOSIS — N6310 Unspecified lump in the right breast, unspecified quadrant: Secondary | ICD-10-CM

## 2020-06-05 DIAGNOSIS — M722 Plantar fascial fibromatosis: Secondary | ICD-10-CM | POA: Diagnosis not present

## 2020-06-06 LAB — SEDIMENTATION RATE: Sed Rate: 19 mm/h (ref 0–30)

## 2020-06-06 LAB — CBC WITH DIFFERENTIAL/PLATELET
Absolute Monocytes: 308 cells/uL (ref 200–950)
Basophils Absolute: 39 cells/uL (ref 0–200)
Basophils Relative: 0.7 %
Eosinophils Absolute: 118 cells/uL (ref 15–500)
Eosinophils Relative: 2.1 %
HCT: 43.8 % (ref 35.0–45.0)
Hemoglobin: 14.5 g/dL (ref 11.7–15.5)
Lymphs Abs: 1579 cells/uL (ref 850–3900)
MCH: 29.5 pg (ref 27.0–33.0)
MCHC: 33.1 g/dL (ref 32.0–36.0)
MCV: 89.2 fL (ref 80.0–100.0)
MPV: 10.7 fL (ref 7.5–12.5)
Monocytes Relative: 5.5 %
Neutro Abs: 3556 cells/uL (ref 1500–7800)
Neutrophils Relative %: 63.5 %
Platelets: 300 10*3/uL (ref 140–400)
RBC: 4.91 10*6/uL (ref 3.80–5.10)
RDW: 12.6 % (ref 11.0–15.0)
Total Lymphocyte: 28.2 %
WBC: 5.6 10*3/uL (ref 3.8–10.8)

## 2020-06-06 LAB — COMPLETE METABOLIC PANEL WITH GFR
AG Ratio: 1.8 (calc) (ref 1.0–2.5)
ALT: 18 U/L (ref 6–29)
AST: 18 U/L (ref 10–35)
Albumin: 4.5 g/dL (ref 3.6–5.1)
Alkaline phosphatase (APISO): 53 U/L (ref 37–153)
BUN: 18 mg/dL (ref 7–25)
CO2: 29 mmol/L (ref 20–32)
Calcium: 10.1 mg/dL (ref 8.6–10.4)
Chloride: 104 mmol/L (ref 98–110)
Creat: 0.72 mg/dL (ref 0.50–0.99)
GFR, Est African American: 105 mL/min/{1.73_m2} (ref >=60)
GFR, Est Non African American: 90 mL/min/{1.73_m2} (ref >=60)
Globulin: 2.5 g/dL (calc) (ref 1.9–3.7)
Glucose, Bld: 90 mg/dL (ref 65–99)
Potassium: 5 mmol/L (ref 3.5–5.3)
Sodium: 142 mmol/L (ref 135–146)
Total Bilirubin: 0.4 mg/dL (ref 0.2–1.2)
Total Protein: 7 g/dL (ref 6.1–8.1)

## 2020-06-06 LAB — LIPID PANEL
Cholesterol: 201 mg/dL — ABNORMAL HIGH (ref <200)
HDL: 54 mg/dL (ref >=50)
LDL Cholesterol (Calc): 125 mg/dL (calc) — ABNORMAL HIGH
Non-HDL Cholesterol (Calc): 147 mg/dL (calc) — ABNORMAL HIGH (ref <130)
Total CHOL/HDL Ratio: 3.7 (calc) (ref <5.0)
Triglycerides: 112 mg/dL (ref <150)

## 2020-06-06 LAB — C-REACTIVE PROTEIN: CRP: 6.4 mg/L (ref <8.0)

## 2020-06-06 LAB — TSH: TSH: 1.51 mIU/L (ref 0.40–4.50)

## 2020-06-09 ENCOUNTER — Ambulatory Visit: Payer: Federal, State, Local not specified - PPO | Admitting: Podiatry

## 2020-06-09 ENCOUNTER — Other Ambulatory Visit: Payer: Self-pay

## 2020-06-09 ENCOUNTER — Ambulatory Visit (INDEPENDENT_AMBULATORY_CARE_PROVIDER_SITE_OTHER): Payer: Federal, State, Local not specified - PPO

## 2020-06-09 DIAGNOSIS — M722 Plantar fascial fibromatosis: Secondary | ICD-10-CM

## 2020-06-09 MED ORDER — MELOXICAM 15 MG PO TABS: 15.0000 mg | ORAL_TABLET | Freq: Every day | ORAL | 1 refills | Status: DC

## 2020-06-09 MED ORDER — METHYLPREDNISOLONE 4 MG PO TBPK: ORAL_TABLET | ORAL | 0 refills | Status: DC

## 2020-06-18 NOTE — Progress Notes (Signed)
   Subjective: 59 y.o. female presenting to the office today for evaluation of left heel pain is been going on for approximately 4-5 months now.  Patient states that she has tried everything however she continues to have pain and sharp burning sensation to the heel.  She walks with an antalgic gait.  She presents for further treatment and evaluation   Past Medical History:  Diagnosis Date  . Allergy   . Anxiety   . Asthma   . Concussion with no loss of consciousness 02/28/2013  . GERD (gastroesophageal reflux disease)   . Hemorrhoids   . High cholesterol   . Lung nodule   . Seborrheic keratosis      Objective: Physical Exam General: The patient is alert and oriented x3 in no acute distress.  Dermatology: Skin is warm, dry and supple bilateral lower extremities. Negative for open lesions or macerations bilateral.   Vascular: Dorsalis Pedis and Posterior Tibial pulses palpable bilateral.  Capillary fill time is immediate to all digits.  Neurological: Epicritic and protective threshold intact bilateral.   Musculoskeletal: Tenderness to palpation to the plantar aspect of the left heel along the plantar fascia. All other joints range of motion within normal limits bilateral. Strength 5/5 in all groups bilateral.   Radiographic exam: Normal osseous mineralization. Joint spaces preserved. No fracture/dislocation/boney destruction. No other soft tissue abnormalities or radiopaque foreign bodies.   Assessment: 1. Plantar fasciitis left foot  Plan of Care:  1. Patient evaluated. Xrays reviewed.   2. Injection of 0.5cc Celestone soluspan injected into the left plantar fascia.  3. Rx for Medrol Dose Pak placed 4. Rx for Meloxicam ordered for patient. 5.  OTC power step insoles were provided today 6. Instructed patient regarding therapies and modalities at home to alleviate symptoms.  7. Return to clinic in 4 weeks.    *Dr. Ancil Boozer is her PCP.  Goes by Freddi Starr,  DPM [facility]  Dr. Edrick Kins, DPM    [address], [place] [address]                Office [phone]  Fax [phone]

## 2020-06-27 ENCOUNTER — Ambulatory Visit (HOSPITAL_COMMUNITY)
Admission: EM | Admit: 2020-06-27 | Discharge: 2020-06-27 | Discharge status: Absent without leave | Payer: Federal, State, Local not specified - PPO

## 2020-06-27 ENCOUNTER — Encounter (HOSPITAL_COMMUNITY): Payer: Self-pay

## 2020-06-27 ENCOUNTER — Ambulatory Visit (HOSPITAL_COMMUNITY)
Admission: RE | Admit: 2020-06-27 | Discharge: 2020-06-27 | Disposition: A | Discharge status: Home / Self Care | Payer: Federal, State, Local not specified - PPO | Source: Ambulatory Visit | Attending: Family Medicine | Admitting: Family Medicine

## 2020-06-27 ENCOUNTER — Other Ambulatory Visit: Payer: Self-pay

## 2020-06-27 VITALS — BP 125/63 | HR 68 | Temp 97.9°F | Resp 18

## 2020-06-27 DIAGNOSIS — J029 Acute pharyngitis, unspecified: Secondary | ICD-10-CM | POA: Diagnosis not present

## 2020-06-27 MED ORDER — HYDROCODONE-ACETAMINOPHEN 5-325 MG PO TABS
1.0000 | ORAL_TABLET | Freq: Four times a day (QID) | ORAL | 0 refills | Status: DC | PRN
Start: 2020-06-27 — End: 2020-08-08

## 2020-06-27 MED ORDER — AMOXICILLIN 875 MG PO TABS: 875.0000 mg | ORAL_TABLET | Freq: Two times a day (BID) | ORAL | 0 refills | Status: DC

## 2020-06-27 NOTE — ED Triage Notes (Signed)
Pt reports right ear pain and  sore throat x 4 days; ulcer in the right side of the tongue x 1 day; headache and right eye pain since this morning. Tylenol gives some relief  Denies fever, cough, sob, nasal congestin.

## 2020-06-27 NOTE — Discharge Instructions (Signed)
Try salt water gargles for the throat pain Over-the-counter Chloraseptic spray or lozenges may help Take Tylenol for moderate pain, take Tylenol with hydrocodone if pain is severe Continue to drink plenty of fluids Fill and take antibiotic if you fail to improve by 7 to 10 days

## 2020-06-27 NOTE — ED Provider Notes (Signed)
[place]    CSN: [id number] Arrival date & time: 06/27/20  0840      History   Chief Complaint Chief Complaint  Patient presents with  . Appointment    0900  . Otalgia  . Sore Throat    HPI Sara Randolph is a 59 y.o. female.   HPI   Patient has a sore throat on the right side, and ulcer on the right side of her tongue, and right ear pain that is been getting worse for the last 4 days.  No fever or chills.  She is having some headache.  She states she was up all night last night because of throat pain was so severe.  Its painful to swallow.  She is tried over-the-counter pain medication.  She is on meloxicam for plantar fasciitis. No known exposure to illness She has had her Covid vaccinations  Past Medical History:  Diagnosis Date  . Allergy   . Anxiety   . Asthma   . Concussion with no loss of consciousness 02/28/2013  . GERD (gastroesophageal reflux disease)   . Hemorrhoids   . High cholesterol   . Lung nodule   . Seborrheic keratosis     Patient Active Problem List   Diagnosis Date Noted  . Lumbar back pain with radiculopathy affecting right lower extremity 08/31/2018  . Vitamin D deficiency 02/24/2017  . BPPV (benign paroxysmal positional vertigo), unspecified laterality 08/25/2016  . Peripheral vertigo 01/13/2015  . Dyslipidemia 01/13/2015  . Cold sore 01/13/2015  . Gastric reflux 01/13/2015  . Perennial allergic rhinitis 01/13/2015  . Menopausal symptom 01/13/2015  . History of concussion 01/18/2013    Past Surgical History:  Procedure Laterality Date  . ABDOMINAL HYSTERECTOMY  1988  . CHOLECYSTECTOMY    . DILATION AND CURETTAGE OF UTERUS    . ECTOPIC PREGNANCY SURGERY    . RHINOPLASTY  1990  . SALPINGECTOMY    . TONSILLECTOMY  1969  . WRIST SURGERY  1983    OB History   No obstetric history on file.      Home Medications    Prior to Admission medications   Medication Sig Start Date End Date Taking? Authorizing  Provider  acetaminophen (TYLENOL) 500 MG tablet Take 1 tablet by mouth daily.    [provider]  amoxicillin (AMOXIL) 875 MG tablet Take 1 tablet (875 mg total) by mouth 2 (two) times daily. 06/27/20   Raylene Everts, MD  ezetimibe (ZETIA) 10 MG tablet Take 1 tablet (10 mg total) by mouth daily. 04/28/20   Steele Sizer, MD  gabapentin (NEURONTIN) 300 MG capsule Take 1 capsule (300 mg total) by mouth at bedtime. 02/06/20   Steele Sizer, MD  HYDROcodone-acetaminophen (NORCO/VICODIN) 5-325 MG tablet Take 1-2 tablets by mouth every 6 (six) hours as needed. 06/27/20   Raylene Everts, MD  levocetirizine (XYZAL) 5 MG tablet Take 2.5 mg by mouth every evening.     [provider]  meloxicam (MOBIC) 15 MG tablet Take 1 tablet (15 mg total) by mouth daily. 06/09/20   Edrick Kins, DPM  Multiple Vitamin (MULTIVITAMIN) tablet Take 1 tablet by mouth daily.    [provider]  valACYclovir (VALTREX) 1000 MG tablet Take 1 tablet (1,000 mg total) by mouth 2 (two) times daily. 12/20/19   Steele Sizer, MD  Vitamin D, Ergocalciferol, (DRISDOL) 1.25 MG (50000 UNIT) CAPS capsule Take 50,000 Units by mouth once a week. 02/07/20   [provider]  Family History Family History  Problem Relation Age of Onset  . Alzheimer's disease Mother   . Parkinsonism Father   . Heart disease Brother   . Heart attack Brother   . Depression Sister   . Osteoporosis Sister   . Breast cancer Neg Hx     Social History Social History   Tobacco Use  . Smoking status: Never Smoker  . Smokeless tobacco: Never Used  Vaping Use  . Vaping Use: Never used  Substance Use Topics  . Alcohol use: Yes    Alcohol/week: 0.0 standard drinks    Comment: Consumes alcohol on Saturday  . Drug use: No     Allergies   Crestor [rosuvastatin], Atorvastatin, Pravastatin, Venlafaxine, and Hydrocodone   Review of Systems Review of Systems  See HPI Physical Exam Triage Vital Signs ED  Triage Vitals  Enc Vitals Group     BP 06/27/20 0908 125/63     Pulse Rate 06/27/20 0908 68     Resp 06/27/20 0908 18     Temp 06/27/20 0908 97.9 F (36.6 C)     Temp Source 06/27/20 0908 Oral     SpO2 06/27/20 0908 96 %     Weight --      Height --      Head Circumference --      Peak Flow --      Pain Score 06/27/20 0905 6     Pain Loc --      Pain Edu? --      Excl. in [place]? --    No data found.  Updated Vital Signs BP 125/63 (BP Location: Right Arm)   Pulse 68   Temp 97.9 F (36.6 C) (Oral)   Resp 18   SpO2 96%       Physical Exam Constitutional:      General: She is not in acute distress.    Appearance: She is well-developed.  HENT:     Head: Normocephalic and atraumatic.     Right Ear: Tympanic membrane and ear canal normal.     Left Ear: Tympanic membrane and ear canal normal.     Ears:     Comments: No sinus tenderness    Nose: No congestion or rhinorrhea.     Mouth/Throat:     Mouth: Mucous membranes are moist.     Pharynx: Oropharynx is clear. No posterior oropharyngeal erythema.     Comments: Small aphthous ulcer seen on right side of tongue Eyes:     Conjunctiva/sclera: Conjunctivae normal.     Pupils: Pupils are equal, round, and reactive to light.  Neck:     Comments: Tender node at right angle of the jaw Cardiovascular:     Rate and Rhythm: Normal rate and regular rhythm.     Heart sounds: Normal heart sounds.  Pulmonary:     Effort: Pulmonary effort is normal. No respiratory distress.     Breath sounds: Normal breath sounds.  Abdominal:     General: There is no distension.     Palpations: Abdomen is soft.  Musculoskeletal:        General: Normal range of motion.     Cervical back: Normal range of motion.  Lymphadenopathy:     Cervical: Cervical adenopathy present.  Skin:    General: Skin is warm and dry.  Neurological:     Mental Status: She is alert.  Psychiatric:        Behavior: Behavior normal.      UC Treatments /  Results    Labs (all labs ordered are listed, but only abnormal results are displayed) Labs Reviewed - No data to display  EKG   Radiology No results found.  Procedures Procedures (including critical care time)  Medications Ordered in UC Medications - No data to display  Initial Impression / Assessment and Plan / UC Course  I have reviewed the triage vital signs and the nursing notes.  Pertinent labs & imaging results that were available during my care of the patient were reviewed by me and considered in my medical decision making (see chart for details).     Discussed viral illness Discussed symptomatic care Patient feels she needs an antibiotic.  Discussed with her the CDC guidelines that she should not take an antibiotic and she feels improvement over 7 to 10 days.  She is given an antibiotic to fill and take if she needs it. Final Clinical Impressions(s) / UC Diagnoses   Final diagnoses:  Viral pharyngitis     Discharge Instructions     Try salt water gargles for the throat pain Over-the-counter Chloraseptic spray or lozenges may help Take Tylenol for moderate pain, take Tylenol with hydrocodone if pain is severe Continue to drink plenty of fluids Fill and take antibiotic if you fail to improve by 7 to 10 days   ED Prescriptions    Medication Sig Dispense Auth. Provider   amoxicillin (AMOXIL) 875 MG tablet Take 1 tablet (875 mg total) by mouth 2 (two) times daily. 14 tablet Raylene Everts, MD   HYDROcodone-acetaminophen (NORCO/VICODIN) 5-325 MG tablet Take 1-2 tablets by mouth every 6 (six) hours as needed. 10 tablet Raylene Everts, MD     I have reviewed the PDMP during this encounter.   Raylene Everts, MD 06/27/20 2005

## 2020-07-04 ENCOUNTER — Ambulatory Visit
Admission: RE | Admit: 2020-07-04 | Discharge: 2020-07-04 | Disposition: A | Discharge status: Home / Self Care | Payer: Federal, State, Local not specified - PPO | Source: Ambulatory Visit | Attending: Family Medicine | Admitting: Family Medicine

## 2020-07-04 ENCOUNTER — Other Ambulatory Visit: Payer: Self-pay

## 2020-07-04 DIAGNOSIS — N6313 Unspecified lump in the right breast, lower outer quadrant: Secondary | ICD-10-CM | POA: Diagnosis not present

## 2020-07-04 DIAGNOSIS — N6310 Unspecified lump in the right breast, unspecified quadrant: Secondary | ICD-10-CM | POA: Insufficient documentation

## 2020-07-04 DIAGNOSIS — R928 Other abnormal and inconclusive findings on diagnostic imaging of breast: Secondary | ICD-10-CM | POA: Insufficient documentation

## 2020-07-04 HISTORY — PX: BREAST BIOPSY: SHX20

## 2020-07-07 LAB — SURGICAL PATHOLOGY

## 2020-07-14 ENCOUNTER — Other Ambulatory Visit: Payer: Self-pay

## 2020-07-14 ENCOUNTER — Ambulatory Visit: Payer: Federal, State, Local not specified - PPO | Admitting: Podiatry

## 2020-07-14 DIAGNOSIS — M722 Plantar fascial fibromatosis: Secondary | ICD-10-CM | POA: Diagnosis not present

## 2020-07-14 MED ORDER — MELOXICAM 15 MG PO TABS
15.0000 mg | ORAL_TABLET | Freq: Every day | ORAL | 1 refills | Status: DC
Start: 2020-07-14 — End: 2021-05-25

## 2020-07-16 MED ORDER — BETAMETHASONE SOD PHOS & ACET 6 (3-3) MG/ML IJ SUSP
3.0000 mg | Freq: Once | INTRAMUSCULAR | Status: AC
  Administered 2020-07-16: 3 mg via INTRAMUSCULAR

## 2020-07-16 NOTE — Progress Notes (Signed)
   Subjective: 59 y.o. female presenting to the office today for follow-up evaluation of plantar fasciitis to the left foot.  Overall the patient is doing significantly better.  She states that injection and meloxicam helped tremendously.  No new complaints at this time   Past Medical History:  Diagnosis Date  . Allergy   . Anxiety   . Asthma   . Concussion with no loss of consciousness 02/28/2013  . GERD (gastroesophageal reflux disease)   . Hemorrhoids   . High cholesterol   . Lung nodule   . Seborrheic keratosis      Objective: Physical Exam General: The patient is alert and oriented x3 in no acute distress.  Dermatology: Skin is warm, dry and supple bilateral lower extremities. Negative for open lesions or macerations bilateral.   Vascular: Dorsalis Pedis and Posterior Tibial pulses palpable bilateral.  Capillary fill time is immediate to all digits.  Neurological: Epicritic and protective threshold intact bilateral.   Musculoskeletal: Tenderness to palpation to the plantar aspect of the left heel along the plantar fascia. All other joints range of motion within normal limits bilateral. Strength 5/5 in all groups bilateral.   Assessment: 1. Plantar fasciitis left foot; improved  Plan of Care:  1. Patient evaluated.  2. Injection of 0.5cc Celestone soluspan injected into the left plantar fascia.  3.  Continue meloxicam 15 mg daily.  Refill provided 4.  Continue OTC power step insoles 5.  Return to clinic 6 weeks  *Dr. Ancil Boozer is her PCP.  Goes by Freddi Starr, DPM [facility]  Dr. Edrick Kins, DPM    [address], [place] [address]                Office [phone]  Fax [phone]

## 2020-08-01 ENCOUNTER — Other Ambulatory Visit: Payer: Self-pay | Admitting: Family Medicine

## 2020-08-08 ENCOUNTER — Other Ambulatory Visit: Payer: Self-pay

## 2020-08-08 ENCOUNTER — Encounter: Payer: Self-pay | Admitting: Family Medicine

## 2020-08-08 ENCOUNTER — Ambulatory Visit: Payer: Federal, State, Local not specified - PPO | Admitting: Family Medicine

## 2020-08-08 VITALS — BP 118/64 | HR 96 | Temp 98.0°F | Resp 18 | Ht 62.0 in | Wt 142.4 lb

## 2020-08-08 DIAGNOSIS — J3089 Other allergic rhinitis: Secondary | ICD-10-CM

## 2020-08-08 DIAGNOSIS — E538 Deficiency of other specified B group vitamins: Secondary | ICD-10-CM | POA: Insufficient documentation

## 2020-08-08 DIAGNOSIS — E559 Vitamin D deficiency, unspecified: Secondary | ICD-10-CM

## 2020-08-08 DIAGNOSIS — R739 Hyperglycemia, unspecified: Secondary | ICD-10-CM | POA: Insufficient documentation

## 2020-08-08 DIAGNOSIS — G720 Drug-induced myopathy: Secondary | ICD-10-CM | POA: Insufficient documentation

## 2020-08-08 DIAGNOSIS — T466X5A Adverse effect of antihyperlipidemic and antiarteriosclerotic drugs, initial encounter: Secondary | ICD-10-CM | POA: Insufficient documentation

## 2020-08-08 DIAGNOSIS — B001 Herpesviral vesicular dermatitis: Secondary | ICD-10-CM

## 2020-08-08 DIAGNOSIS — K219 Gastro-esophageal reflux disease without esophagitis: Secondary | ICD-10-CM

## 2020-08-08 DIAGNOSIS — M5416 Radiculopathy, lumbar region: Secondary | ICD-10-CM

## 2020-08-08 DIAGNOSIS — R7303 Prediabetes: Secondary | ICD-10-CM | POA: Insufficient documentation

## 2020-08-08 DIAGNOSIS — E7800 Pure hypercholesterolemia, unspecified: Secondary | ICD-10-CM | POA: Diagnosis not present

## 2020-08-08 MED ORDER — EZETIMIBE 10 MG PO TABS: 10.0000 mg | ORAL_TABLET | Freq: Every day | ORAL | 1 refills | Status: DC

## 2020-08-08 MED ORDER — VALACYCLOVIR HCL 1 G PO TABS: 1000.0000 mg | ORAL_TABLET | Freq: Two times a day (BID) | ORAL | 0 refills | Status: DC

## 2020-08-08 MED ORDER — VITAMIN D (ERGOCALCIFEROL) 1.25 MG (50000 UNIT) PO CAPS: 50000.0000 [IU] | ORAL_CAPSULE | ORAL | 1 refills | Status: DC

## 2020-08-08 MED ORDER — GABAPENTIN 300 MG PO CAPS: 300.0000 mg | ORAL_CAPSULE | Freq: Every day | ORAL | 1 refills | Status: DC

## 2020-08-08 NOTE — Progress Notes (Signed)
Name: Sara Randolph   MRN: [id number]    DOB: November 30, 1960   Date:08/08/2020       Progress Note  Subjective  Chief Complaint  Chief Complaint  Patient presents with  . Hyperlipidemia  . Medication Refill    HPI   B12and Vitamin D deficiency:last levels were normal, she is out of supplementation   Abnormal mammogram: had a biopsy that was negative, and will go back in May for repeat mammogram   Plantar fascitis:under the care of Dr. Amalia Hailey , has steroid injection and is doing  better   Hyperlipidemia:she has noticed muscle aches and fatigue with statin therapy, she is now on Zetia and last LDL improved , down from 180's to 125   Pre-diabetes: she is on a low sugar diet, denies polyphagia, polydipsia or polyuria, seen by Dr. Gabriel Carina, on life style modification only.she is on [employer] since April 1st, 2021 , weight was down to 136 lbs but gained a little weight over the holidays but is resuming her diet   GERD: she had a flare when she took prednisone and meloxicam for her plantar fascitis but doing better now and taking omeprazole prn again   Low back pain with radiculitis:going on for over one year, she has neurosurgeon Dr. Delice Lesch , she had a normal MRI in 2020 - unable to see the results. She states no recent flares, she takes Gabapentin prn when she notices right lower extremity paresthesia.   Lung nodule: seen by Dr. Raul Del in the past and last CT was stable and released back in 2015. Unchanged   Perennial AR: she has intermittent symptoms, she states had a flare when it was warm a couple of weeks ago.   Fever blisters: she has a little outbreak now , she resumed acyclovir and is resolving now  Recent fall: tripped on a bag, fell on right shoulder pain was intense and radiculitis symptoms right hand, she states doing better now, but stills bothers her when sleeping on right lateral decubitus   Patient Active Problem List   Diagnosis Date Noted  . Lumbar back  pain with radiculopathy affecting right lower extremity 08/31/2018  . Vitamin D deficiency 02/24/2017  . BPPV (benign paroxysmal positional vertigo), unspecified laterality 08/25/2016  . Peripheral vertigo 01/13/2015  . Dyslipidemia 01/13/2015  . Cold sore 01/13/2015  . Gastric reflux 01/13/2015  . Perennial allergic rhinitis 01/13/2015  . Menopausal symptom 01/13/2015  . History of concussion 01/18/2013    Past Surgical History:  Procedure Laterality Date  . ABDOMINAL HYSTERECTOMY  1988  . BREAST BIOPSY Right 07/04/2020   stereo bx, x-clip, path pending   . CHOLECYSTECTOMY    . DILATION AND CURETTAGE OF UTERUS    . ECTOPIC PREGNANCY SURGERY    . RHINOPLASTY  1990  . SALPINGECTOMY    . TONSILLECTOMY  1969  . WRIST SURGERY  1983    Family History  Problem Relation Age of Onset  . Alzheimer's disease Mother   . Parkinsonism Father   . Heart disease Brother   . Heart attack Brother   . Depression Sister   . Osteoporosis Sister   . Breast cancer Neg Hx     Social History   Tobacco Use  . Smoking status: Never Smoker  . Smokeless tobacco: Never Used  Substance Use Topics  . Alcohol use: Yes    Alcohol/week: 0.0 standard drinks    Comment: Consumes alcohol on Saturday     Current Outpatient Medications:  .  acetaminophen (TYLENOL) 500 MG tablet, Take 1 tablet by mouth daily., Disp: , Rfl:  .  ezetimibe (ZETIA) 10 MG tablet, Take 1 tablet by mouth once daily, Disp: 90 tablet, Rfl: 0 .  gabapentin (NEURONTIN) 300 MG capsule, Take 1 capsule (300 mg total) by mouth at bedtime., Disp: 90 capsule, Rfl: 1 .  levocetirizine (XYZAL) 5 MG tablet, Take 2.5 mg by mouth every evening. , Disp: , Rfl:  .  meloxicam (MOBIC) 15 MG tablet, Take 1 tablet (15 mg total) by mouth daily., Disp: 30 tablet, Rfl: 1 .  Multiple Vitamin (MULTIVITAMIN) tablet, Take 1 tablet by mouth daily., Disp: , Rfl:  .  valACYclovir (VALTREX) 1000 MG tablet, Take 1 tablet (1,000 mg total) by mouth 2 (two)  times daily., Disp: 30 tablet, Rfl: 0 .  Vitamin D, Ergocalciferol, (DRISDOL) 1.25 MG (50000 UNIT) CAPS capsule, Take 50,000 Units by mouth once a week., Disp: , Rfl:   Allergies  Allergen Reactions  . Crestor [Rosuvastatin]     Cause muscle cramps, dizziness, headache and overall fatigue.  . Atorvastatin     Muscle ache  . Pravastatin Itching    dizziness  . Venlafaxine Other (See Comments)    dizziness  . Hydrocodone Nausea Only    I personally reviewed active problem list, medication list, allergies, family history, social history, health maintenance with the patient/caregiver today.   ROS  Constitutional: Negative for fever or weight change.  Respiratory: Negative for cough and shortness of breath.   Cardiovascular: Negative for chest pain or palpitations.  Gastrointestinal: Negative for abdominal pain, no bowel changes.  Musculoskeletal: Negative for gait problem or joint swelling.  Skin: Negative for rash.  Neurological: Negative for dizziness or headache.  No other specific complaints in a complete review of systems (except as listed in HPI above).  Objective  Vitals:   08/08/20 1118  BP: 118/64  Pulse: 96  Resp: 18  Temp: 98 F (36.7 C)  TempSrc: Oral  SpO2: 98%  Weight: 142 lb 6.4 oz (64.6 kg)  Height: 5\' 2"  (1.575 m)    Body mass index is 26.05 kg/m.  Physical Exam  Constitutional: Patient appears well-developed and well-nourished. No distress.  HEENT: head atraumatic, normocephalic, pupils equal and reactive to light,  neck supple Cardiovascular: Normal rate, regular rhythm and normal heart sounds.  No murmur heard. No BLE edema. Pulmonary/Chest: Effort normal and breath sounds normal. No respiratory distress. Abdominal: Soft.  There is no tenderness. Psychiatric: Patient has a normal mood and affect. behavior is normal. Judgment and thought content normal.  Recent Results (from the past 2160 hour(s))  Lipid panel     Status: Abnormal   Collection  Time: 06/05/20  9:17 AM  Result Value Ref Range   Cholesterol 201 (H) <200 mg/dL   HDL 54 > OR = 50 mg/dL   Triglycerides 112 <150 mg/dL   LDL Cholesterol (Calc) 125 (H) mg/dL (calc)    Comment: Reference range: <100 . Desirable range <100 mg/dL for primary prevention;   <70 mg/dL for patients with CHD or diabetic patients  with > or = 2 CHD risk factors. Marland Kitchen LDL-C is now calculated using the Martin-Hopkins  calculation, which is a validated novel method providing  better accuracy than the Friedewald equation in the  estimation of LDL-C.  Cresenciano Genre et al. Annamaria Helling. [id number]): 2061-2068  (http://education.[facility].com/faq/FAQ164)    Total CHOL/HDL Ratio 3.7 <5.0 (calc)   Non-HDL Cholesterol (Calc) 147 (H) <130 mg/dL (calc)    Comment: For  patients with diabetes plus 1 major ASCVD risk  factor, treating to a non-HDL-C goal of <100 mg/dL  (LDL-C of <70 mg/dL) is considered a therapeutic  option.   C-reactive protein     Status: None   Collection Time: 06/05/20  9:17 AM  Result Value Ref Range   CRP 6.4 <8.0 mg/L  Sedimentation rate     Status: None   Collection Time: 06/05/20  9:17 AM  Result Value Ref Range   Sed Rate 19 0 - 30 mm/h  TSH     Status: None   Collection Time: 06/05/20  9:17 AM  Result Value Ref Range   TSH 1.51 0.40 - 4.50 mIU/L  CBC with Differential/Platelet     Status: None   Collection Time: 06/05/20  9:17 AM  Result Value Ref Range   WBC 5.6 3.8 - 10.8 Thousand/uL   RBC 4.91 3.80 - 5.10 Million/uL   Hemoglobin 14.5 11.7 - 15.5 g/dL   HCT 43.8 35.0 - 45.0 %   MCV 89.2 80.0 - 100.0 fL   MCH 29.5 27.0 - 33.0 pg   MCHC 33.1 32.0 - 36.0 g/dL   RDW 12.6 11.0 - 15.0 %   Platelets 300 140 - 400 Thousand/uL   MPV 10.7 7.5 - 12.5 fL   Neutro Abs 3,556 1,500 - 7,800 cells/uL   Lymphs Abs 1,579 850 - 3,900 cells/uL   Absolute Monocytes 308 200 - 950 cells/uL   Eosinophils Absolute 118 15 - 500 cells/uL   Basophils Absolute 39 0 - 200 cells/uL    Neutrophils Relative % 63.5 %   Total Lymphocyte 28.2 %   Monocytes Relative 5.5 %   Eosinophils Relative 2.1 %   Basophils Relative 0.7 %  COMPLETE METABOLIC PANEL WITH GFR     Status: None   Collection Time: 06/05/20  9:17 AM  Result Value Ref Range   Glucose, Bld 90 65 - 99 mg/dL    Comment: .            Fasting reference interval .    BUN 18 7 - 25 mg/dL   Creat 0.72 0.50 - 0.99 mg/dL    Comment: For patients >33 years of age, the reference limit for Creatinine is approximately 13% higher for people identified as African-American. .    GFR, Est Non African American 90 > OR = 60 mL/min/1.8m2   GFR, Est African American 105 > OR = 60 mL/min/1.56m2   BUN/Creatinine Ratio NOT APPLICABLE 6 - 22 (calc)   Sodium 142 135 - 146 mmol/L   Potassium 5.0 3.5 - 5.3 mmol/L   Chloride 104 98 - 110 mmol/L   CO2 29 20 - 32 mmol/L   Calcium 10.1 8.6 - 10.4 mg/dL   Total Protein 7.0 6.1 - 8.1 g/dL   Albumin 4.5 3.6 - 5.1 g/dL   Globulin 2.5 1.9 - 3.7 g/dL (calc)   AG Ratio 1.8 1.0 - 2.5 (calc)   Total Bilirubin 0.4 0.2 - 1.2 mg/dL   Alkaline phosphatase (APISO) 53 37 - 153 U/L   AST 18 10 - 35 U/L   ALT 18 6 - 29 U/L  Surgical pathology     Status: None   Collection Time: 07/04/20  9:50 AM  Result Value Ref Range   SURGICAL PATHOLOGY      SURGICAL PATHOLOGY CASE: [id number] PATIENT: Sojourner Din Surgical Pathology Report     Specimen Submitted: A. Breast, right, lower outer; stereo bx  Clinical History: Persistent circumscribed, slightly  irregular 6 mm mass in the deep central right breast without ultrasound correlate. Post biopsy mammograms show appropriate positioning of the X-shaped biopsy marking clip at the site of biopsy in the posterior LOWER OUTER QUADRANT of the RIGHT breast.     DIAGNOSIS: A. BREAST, RIGHT, LOWER OUTER; STEREOTACTIC-GUIDED CORE BIOPSY: - FATTY BREAST TISSUE WITH LIMITED FOCI OF FIBROSIS. - NEGATIVE FOR ATYPIA AND MALIGNANCY, SEE  COMMENT.  Comment: There are no histologic changes in this specimen which might account for the appearance of a mass. Careful correlation with all imaging findings is recommended to be sure the sample represents the area of concern.  GROSS DESCRIPTION: A. Labeled: Right breast lower outer for mass stereo biopsy Received: in a form alin-filled [place] collection device Specimen radiograph image(s) available for review Time/Date in fixative: Collected at 9:50 AM on 07/04/2020 and placed in formalin at 9:02 AM on 07/04/2020 Cold ischemic time: Less than 5 minutes Total fixation time: 11 hours Core pieces: Multiple Measurement: Aggregate, 6.8 x 1.2 x 0.4 cm Description / comments: Received are cores and fragments of yellow fibrofatty tissue.  A diagram indicating the location of calcifications is not received on the container or with the requisition. Inked: Green Entirely submitted in cassette(s):  1 - sections A and B 2 - sections C and D 3 - sections E and F with the remaining miniscule fragments in sections G, H, I, J   Final Diagnosis performed by Bryan Lemma, MD.   Electronically signed 07/07/2020 12:08:52PM The electronic signature indicates that the named Attending Pathologist has evaluated the specimen Technical component performed at [facility], [address] [place], [place] [address] Lab: [phone] Dir: Rush Farmer, MD, MMM  Professional component performed at [facility], [facility], [place], [place], [place] [address] Lab: [phone] Dir: Dellia Nims. Rubinas, MD       PHQ2/9: Depression screen [facility] 2/9 08/08/2020 06/05/2020 06/05/2020 04/28/2020 02/06/2020  Decreased Interest 0 0 0 0 0  Down, Depressed, Hopeless 0 0 0 0 0  PHQ - 2 Score 0 0 0 0 0  Altered sleeping - 3 - - 0  Tired, decreased energy - 3 - - 0  Change in appetite - 0 - - 0  Feeling bad or failure about yourself  - 0 - - 0  Trouble concentrating - 0 - - 0  Moving slowly or  fidgety/restless - 0 - - 0  Suicidal thoughts - 0 - - 0  PHQ-9 Score - 6 - - 0  Difficult doing work/chores - Not difficult at all - - -  Some recent data might be hidden    phq 9 is negative   Fall Risk: Fall Risk  08/08/2020 04/28/2020 02/06/2020 08/09/2019 03/06/2019  Falls in the past year? 1 0 0 0 0  Number falls in past yr: 0 0 0 0 0  Comment - - - - -  Injury with Fall? 1 0 0 0 0  Comment - - - - -  Follow up - - - - -    Functional Status Survey: Is the patient deaf or have difficulty hearing?: No Does the patient have difficulty seeing, even when wearing glasses/contacts?: No Does the patient have difficulty concentrating, remembering, or making decisions?: No Does the patient have difficulty walking or climbing stairs?: No Does the patient have difficulty dressing or bathing?: No Does the patient have difficulty doing errands alone such as visiting a doctor's office or shopping?: No    Assessment & Plan  1. Lumbar back pain with radiculopathy affecting right lower extremity  - gabapentin (NEURONTIN) 300 MG capsule; Take 1 capsule (300 mg total) by mouth at bedtime.  Dispense: 90 capsule; Refill: 1  2. Cold sore  - valACYclovir (VALTREX) 1000 MG tablet; Take 1 tablet (1,000 mg total) by mouth 2 (two) times daily.  Dispense: 30 tablet; Refill: 0  3. Hypercholesteremia  - ezetimibe (ZETIA) 10 MG tablet; Take 1 tablet (10 mg total) by mouth daily.  Dispense: 90 tablet; Refill: 1  4. B12 deficiency  Resume supplementation   5. Hyperglycemia   6. Vitamin D deficiency  - Vitamin D, Ergocalciferol, (DRISDOL) 1.25 MG (50000 UNIT) CAPS capsule; Take 1 capsule (50,000 Units total) by mouth once a week.  Dispense: 12 capsule; Refill: 1  7. Perennial allergic rhinitis   8. Gastroesophageal reflux disease without esophagitis  Stable  9. Statin myopathy

## 2020-08-19 ENCOUNTER — Ambulatory Visit: Payer: Federal, State, Local not specified - PPO | Admitting: Dermatology

## 2020-08-19 ENCOUNTER — Other Ambulatory Visit: Payer: Self-pay

## 2020-08-19 DIAGNOSIS — L820 Inflamed seborrheic keratosis: Secondary | ICD-10-CM | POA: Diagnosis not present

## 2020-08-19 DIAGNOSIS — Z1283 Encounter for screening for malignant neoplasm of skin: Secondary | ICD-10-CM | POA: Diagnosis not present

## 2020-08-19 DIAGNOSIS — L578 Other skin changes due to chronic exposure to nonionizing radiation: Secondary | ICD-10-CM

## 2020-08-19 DIAGNOSIS — L0100 Impetigo, unspecified: Secondary | ICD-10-CM | POA: Diagnosis not present

## 2020-08-19 DIAGNOSIS — D1800 Hemangioma unspecified site: Secondary | ICD-10-CM

## 2020-08-19 DIAGNOSIS — D225 Melanocytic nevi of trunk: Secondary | ICD-10-CM | POA: Diagnosis not present

## 2020-08-19 DIAGNOSIS — L918 Other hypertrophic disorders of the skin: Secondary | ICD-10-CM

## 2020-08-19 DIAGNOSIS — L814 Other melanin hyperpigmentation: Secondary | ICD-10-CM

## 2020-08-19 DIAGNOSIS — L821 Other seborrheic keratosis: Secondary | ICD-10-CM

## 2020-08-19 DIAGNOSIS — D229 Melanocytic nevi, unspecified: Secondary | ICD-10-CM

## 2020-08-19 MED ORDER — MUPIROCIN 2 % EX OINT: 1.0000 "application " | TOPICAL_OINTMENT | Freq: Two times a day (BID) | CUTANEOUS | 0 refills | Status: DC

## 2020-08-19 NOTE — Progress Notes (Signed)
New Patient Visit  Subjective  Sara Randolph is a 60 y.o. female who presents for the following: tbse (New patient here for tbse. Denies personal and family history of skin cancer. Has history of mole removed at right bra line area. States she has several skin tags 1 on right inner thigh, 1 on right axilla that she would like looked at. She also has spot on posterior right shoulder and posterior right leg that crusted and came off several weeks ago. ).  Spots on breasts get irritated and cause problems with getting mammograms.  Patient here for full body skin exam and skin cancer screening. The patient complains of symptomatic skin tags on the right  and left axilla and R medial thigh . These are irritated by clothing, jewelry and rubbing.  Objective  Well appearing patient in no apparent distress; mood and affect are within normal limits.  A full examination was performed including scalp, head, eyes, ears, nose, lips, neck, chest, axillae, abdomen, back, buttocks, bilateral upper extremities, bilateral lower extremities, hands, feet, fingers, toes, fingernails, and toenails. All findings within normal limits unless otherwise noted below.  Objective  right axilla x 3, left axilla x 2, right medial thigh x 2 (7): Fleshy, skin-colored pedunculated papules.    Objective  bilateral breast x 2 (2): Erythematous keratotic or waxy stuck-on papule or plaque.   Objective  Right Upper Back: 4 mm flesh papule   Objective  left inner nostril: Crusted scab in left inner nostril   Assessment & Plan  Acrochordon (7) right axilla x 3, left axilla x 2, right medial thigh x 2  Benign, irritated by clothing.  Discussed treatment and that insurance generally does not cover.  Cosmetic fee is $115.  Procedure: Skin tag removal Informed consent:  Discussed risks (permanent scarring, infection, pain, bleeding, bruising, redness, and recurrence of the lesion) and benefits of the procedure, as well as  the alternatives.  She is aware that skin tags are benign lesions, and their removal is often not considered medically necessary.  Informed consent was obtained. Anesthesia: lidocaine with epi The area was prepared and draped in a standard fashion. Snip removal was performed.   Antibiotic ointment and a sterile dressing were applied.   The patient tolerated procedure well. The patient was instructed on post-op care.   Number of lesions removed:  7 Right axilla x 3  left axilla x 2 , right medial thigh x 2  Inflamed seborrheic keratosis (2) bilateral breast x 2  May need more than one treatment to clear.  Destruction of lesion - bilateral breast x 2  Destruction method: cryotherapy   Informed consent: discussed and consent obtained   Lesion destroyed using liquid nitrogen: Yes   Region frozen until ice ball extended beyond lesion: Yes   Outcome: patient tolerated procedure well with no complications   Post-procedure details: wound care instructions given    Nevus Right Upper Back  Benign-appearing.  Observation.  Call clinic for new or changing lesions.  Recommend daily use of broad spectrum spf 30+ sunscreen to sun-exposed areas.  Recently irritated, but has healed.  Discussed shave removal if continues to be symptomatic.  Impetigo left inner nostril  Start mupirocin ointment 2% apply 2 times daily for 1 week to left inner nostril.   If doesn't improve, recommend pt seeing ENT.  mupirocin ointment (BACTROBAN) 2 % - left inner nostril   Lentigines  Back  - Scattered tan macules - Discussed due to sun exposure -  Benign, observe - Call for any changes  Seborrheic Keratoses Left thigh anterior and posterior, right posterior shoulder, left abdomen, right abdomen,  - Stuck-on, waxy, tan-brown papules and plaques  - Discussed benign etiology and prognosis. - Observe  - Call for any changes  Melanocytic Nevi - Tan-brown and/or pink-flesh-colored symmetric macules and  papules - Benign appearing on exam today - Observation - Call clinic for new or changing moles - Recommend daily use of broad spectrum spf 30+ sunscreen to sun-exposed areas.   Hemangiomas Left labia groin, and trunk - Red papules - Discussed benign nature - Observe - Call for any changes  Actinic Damage Back, chest,  - Chronic, secondary to cumulative UV/sun exposure - diffuse scaly erythematous macules with underlying dyspigmentation - Recommend daily broad spectrum sunscreen SPF 30+ to sun-exposed areas, reapply every 2 hours as needed.  - Call for new or changing lesions.  Skin cancer screening performed today.  Return in about 2 months (around 10/17/2020) for ISK follow up .  I, Ruthell Rummage, CMA, am acting as scribe for Brendolyn Patty, MD.  Documentation: I have reviewed the above documentation for accuracy and completeness, and I agree with the above.  Brendolyn Patty MD

## 2020-08-19 NOTE — Patient Instructions (Addendum)
Seborrheic Keratosis  What causes seborrheic keratoses? Seborrheic keratoses are harmless, common skin growths that first appear during adult life.  As time goes by, more growths appear.  Some people may develop a large number of them.  Seborrheic keratoses appear on both covered and uncovered body parts.  They are not caused by sunlight.  The tendency to develop seborrheic keratoses can be inherited.  They vary in color from skin-colored to gray, brown, or even black.  They can be either smooth or have a rough, warty surface.   Seborrheic keratoses are superficial and look as if they were stuck on the skin.  Under the microscope this type of keratosis looks like layers upon layers of skin.  That is why at times the top layer may seem to fall off, but the rest of the growth remains and re-grows.    Treatment Seborrheic keratoses do not need to be treated, but can easily be removed in the office.  Seborrheic keratoses often cause symptoms when they rub on clothing or jewelry.  Lesions can be in the way of shaving.  If they become inflamed, they can cause itching, soreness, or burning.  Removal of a seborrheic keratosis can be accomplished by freezing, burning, or surgery. If any spot bleeds, scabs, or grows rapidly, please return to have it checked, as these can be an indication of a skin cancer.  Melanoma ABCDEs  Melanoma is the most dangerous type of skin cancer, and is the leading cause of death from skin disease.  You are more likely to develop melanoma if you:  Have light-colored skin, light-colored eyes, or red or blond hair  Spend a lot of time in the sun  Tan regularly, either outdoors or in a tanning bed  Have had blistering sunburns, especially during childhood  Have a close family member who has had a melanoma  Have atypical moles or large birthmarks  Early detection of melanoma is key since treatment is typically straightforward and cure rates are extremely high if we catch it  early.   The first sign of melanoma is often a change in a mole or a new dark spot.  The ABCDE system is a way of remembering the signs of melanoma.  A for asymmetry:  The two halves do not match. B for border:  The edges of the growth are irregular. C for color:  A mixture of colors are present instead of an even brown color. D for diameter:  Melanomas are usually (but not always) greater than 26mm - the size of a pencil eraser. E for evolution:  The spot keeps changing in size, shape, and color.  Please check your skin once per month between visits. You can use a small mirror in front and a large mirror behind you to keep an eye on the back side or your body.   If you see any new or changing lesions before your next follow-up, please call to schedule a visit.  Please continue daily skin protection including broad spectrum sunscreen SPF 30+ to sun-exposed areas, reapplying every 2 hours as needed when you're outdoors.   Cryotherapy Aftercare  . Wash gently with soap and water everyday.   Marland Kitchen Apply Vaseline and Band-Aid daily until healed.  Skin Tag Removal Wound Care Instructions  . Sometimes bandages are not necessary.  If a bandage is used, leave the original bandage on for 24 hours if possible.  If the bandage becomes soaked or soiled before that time, it is OK to  remove it and examine the wound.  A small amount of post-operative bleeding is normal.  If excessive bleeding occurs, remove the bandage, place gauze over the site and apply continuous pressure (no peeking) over the area for 20-30 minutes.  If this does not stop the bleeding, try again for 40 minutes.  If this does not work, please call our clinic as soon as possible (even if after hours).    . Once a day, cleanse the wound with soap and water.  If a thick crust develops you may use a Q-tip dipped into dilute hydrogen peroxide (mix 1:1 with water) to dissolve it.  Hydrogen peroxide can slow the healing process, so use it only as  needed.  After washing, apply Vaseline jelly or Polysporin ointment.  For best healing, the wound should be covered with a layer of ointment at all times.  This may mean re-applying the ointment several times a day.  For open wounds, continue until it has healed.    . If you have any swelling, keep the area elevated.  . Some redness, tenderness and white or yellow material in the wound is normal healing.  If the area becomes very sore and red, or develops a thick yellow-green material (pus), it may be infected; please notify us.    . Wound healing continues for up to one year following surgery.  It is not unusual to experience pain in the scar from time to time during the interval.  If the pain becomes severe or the scar thickens, you should notify the office.  A slight amount of redness in a scar is expected for the first six months.  After six months, the redness subsides and the scar will soften and fade.  The color difference becomes less noticeable with time.  If there are any problems, return for a post-op surgery check at your earliest convenience.  . It is important to wear sunscreen daily with an SPF of 30 or higher over the treated sites and to wear sun-protective clothing.  . Please call our office at [phone] for any questions or concerns.

## 2020-08-25 ENCOUNTER — Other Ambulatory Visit: Payer: Self-pay

## 2020-08-25 ENCOUNTER — Ambulatory Visit: Payer: Federal, State, Local not specified - PPO | Admitting: Podiatry

## 2020-08-25 DIAGNOSIS — M722 Plantar fascial fibromatosis: Secondary | ICD-10-CM | POA: Diagnosis not present

## 2020-09-02 NOTE — Progress Notes (Signed)
   Subjective: 60 y.o. female presenting for follow-up evaluation of plantar fasciitis of the left foot.  Overall the patient is doing significantly better.  She states that the injection and meloxicam have helped significantly.  She continues to wear bionic shoes.  She presents for final follow-up and evaluation.  No new complaints at this time   Past Medical History:  Diagnosis Date  . Allergy   . Anxiety   . Asthma   . Concussion with no loss of consciousness 02/28/2013  . GERD (gastroesophageal reflux disease)   . Hemorrhoids   . High cholesterol   . Lung nodule   . Seborrheic keratosis      Objective: Physical Exam General: The patient is alert and oriented x3 in no acute distress.  Dermatology: Skin is warm, dry and supple bilateral lower extremities. Negative for open lesions or macerations bilateral.   Vascular: Dorsalis Pedis and Posterior Tibial pulses palpable bilateral.  Capillary fill time is immediate to all digits.  Neurological: Epicritic and protective threshold intact bilateral.   Musculoskeletal: Negative for any significant tenderness to palpation to the plantar aspect of the left heel along the plantar fascia. All other joints range of motion within normal limits bilateral. Strength 5/5 in all groups bilateral.   Assessment: 1. Plantar fasciitis left foot; improved  Plan of Care:  1. Patient evaluated.  2.  Overall the patient is doing very well with minimal pain or discomfort. 3.  Continue wearing bionic shoes 4.  Continue meloxicam 15 mg as needed 5.  Return to clinic as needed  *Dr. Ancil Boozer is her PCP.  Goes by Freddi Starr, DPM [facility]  Dr. Edrick Kins, DPM    [address], [place] [address]                Office [phone]  Fax [phone]

## 2020-09-05 ENCOUNTER — Other Ambulatory Visit: Payer: Federal, State, Local not specified - PPO

## 2020-09-05 ENCOUNTER — Other Ambulatory Visit: Payer: Self-pay

## 2020-09-05 DIAGNOSIS — Z20822 Contact with and (suspected) exposure to covid-19: Secondary | ICD-10-CM | POA: Diagnosis not present

## 2020-09-06 LAB — SARS-COV-2, NAA 2 DAY TAT

## 2020-09-06 LAB — NOVEL CORONAVIRUS, NAA: SARS-CoV-2, NAA: NOT DETECTED

## 2020-09-11 ENCOUNTER — Encounter: Payer: Self-pay | Admitting: Family Medicine

## 2020-09-12 ENCOUNTER — Other Ambulatory Visit: Payer: Federal, State, Local not specified - PPO

## 2020-09-12 ENCOUNTER — Other Ambulatory Visit: Payer: Self-pay

## 2020-09-12 DIAGNOSIS — Z20822 Contact with and (suspected) exposure to covid-19: Secondary | ICD-10-CM

## 2020-09-13 LAB — NOVEL CORONAVIRUS, NAA: SARS-CoV-2, NAA: NOT DETECTED

## 2020-09-13 LAB — SARS-COV-2, NAA 2 DAY TAT

## 2020-09-23 ENCOUNTER — Ambulatory Visit: Payer: Federal, State, Local not specified - PPO | Admitting: Dermatology

## 2020-10-07 DIAGNOSIS — M25511 Pain in right shoulder: Secondary | ICD-10-CM | POA: Diagnosis not present

## 2020-10-28 DIAGNOSIS — K080 Exfoliation of teeth due to systemic causes: Secondary | ICD-10-CM | POA: Diagnosis not present

## 2020-11-06 DIAGNOSIS — M7541 Impingement syndrome of right shoulder: Secondary | ICD-10-CM | POA: Insufficient documentation

## 2020-11-07 DIAGNOSIS — M7541 Impingement syndrome of right shoulder: Secondary | ICD-10-CM | POA: Diagnosis not present

## 2020-12-02 DIAGNOSIS — M25511 Pain in right shoulder: Secondary | ICD-10-CM | POA: Diagnosis not present

## 2020-12-10 DIAGNOSIS — M7541 Impingement syndrome of right shoulder: Secondary | ICD-10-CM | POA: Diagnosis not present

## 2021-01-05 ENCOUNTER — Ambulatory Visit
Admission: RE | Admit: 2021-01-05 | Discharge: 2021-01-05 | Disposition: A | Discharge status: Home / Self Care | Payer: Federal, State, Local not specified - PPO | Source: Ambulatory Visit | Attending: Family Medicine | Admitting: Family Medicine

## 2021-01-05 ENCOUNTER — Other Ambulatory Visit: Payer: Self-pay

## 2021-01-05 DIAGNOSIS — R928 Other abnormal and inconclusive findings on diagnostic imaging of breast: Secondary | ICD-10-CM | POA: Insufficient documentation

## 2021-01-05 DIAGNOSIS — N6310 Unspecified lump in the right breast, unspecified quadrant: Secondary | ICD-10-CM

## 2021-01-05 DIAGNOSIS — R922 Inconclusive mammogram: Secondary | ICD-10-CM | POA: Diagnosis not present

## 2021-01-08 ENCOUNTER — Other Ambulatory Visit: Payer: Self-pay | Admitting: Family Medicine

## 2021-01-08 DIAGNOSIS — B001 Herpesviral vesicular dermatitis: Secondary | ICD-10-CM

## 2021-01-11 IMAGING — MG MM DIGITAL DIAGNOSTIC UNILAT*R* W/ TOMO W/ CAD
4 series · 4 of 12 positions shown · non-contrast
Comparison: Previous exam(s).

CLINICAL DATA: 59-year-old female recalled from screening mammogram
dated 05/02/2020 for a possible right breast mass.

EXAM:
DIGITAL DIAGNOSTIC RIGHT MAMMOGRAM WITH CAD AND TOMO
ULTRASOUND RIGHT BREAST

[R MLO synth-2D]
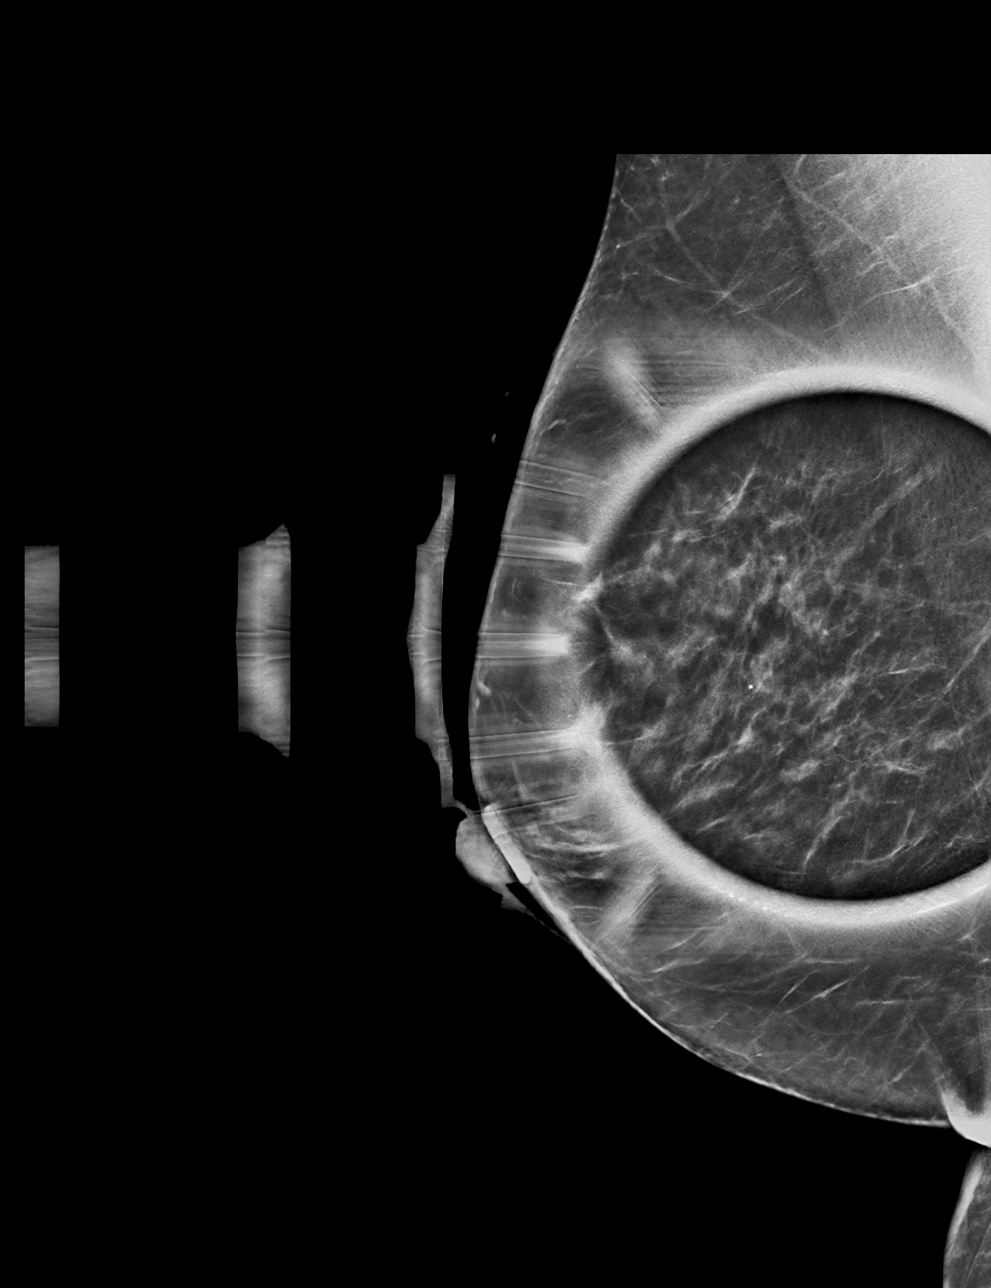

[R CC synth-2D]
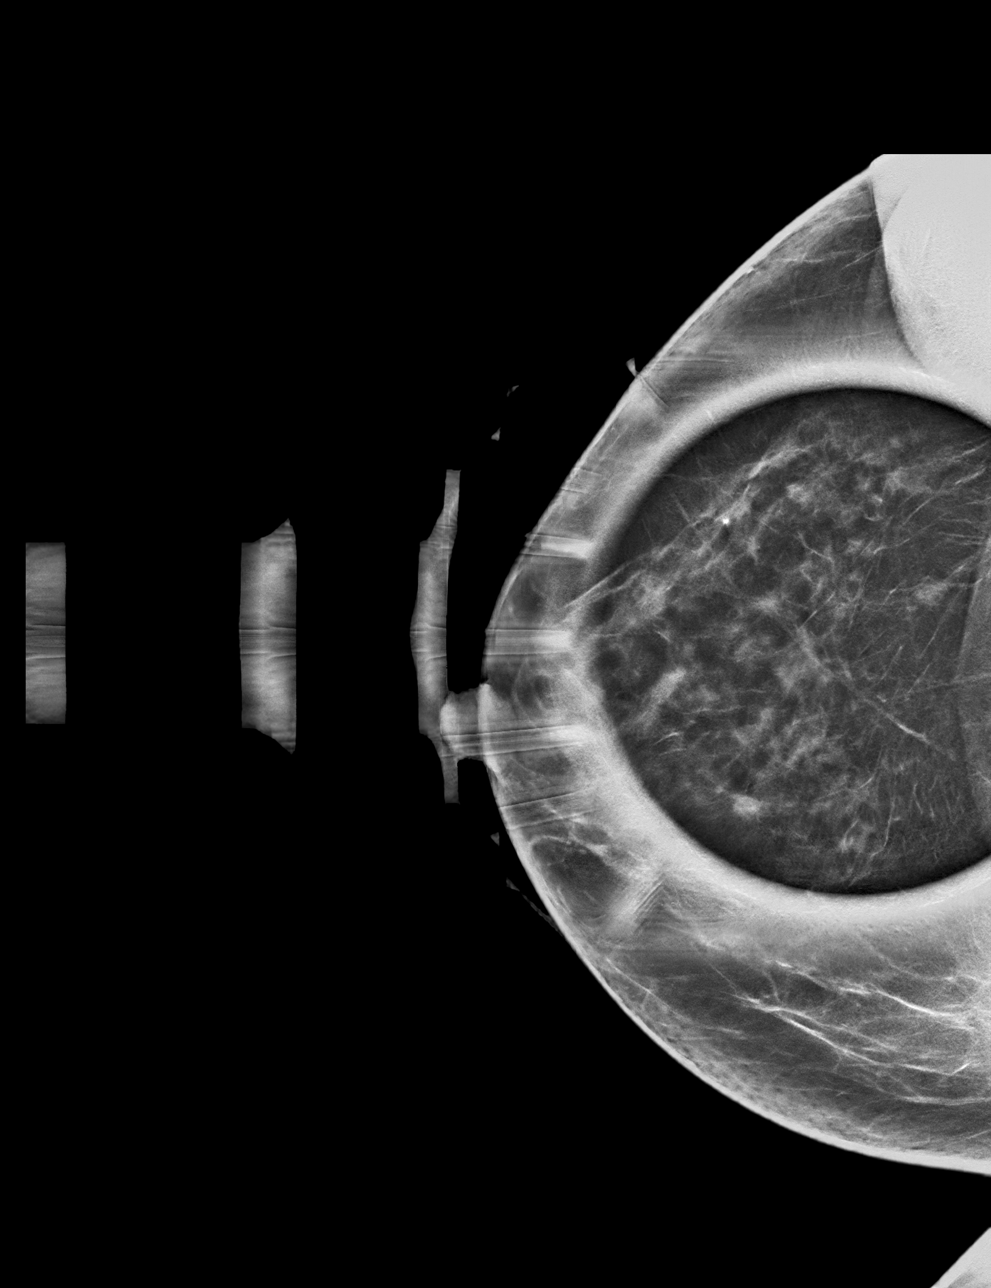

[R MLO tomo · tomo slice 31/62.0]
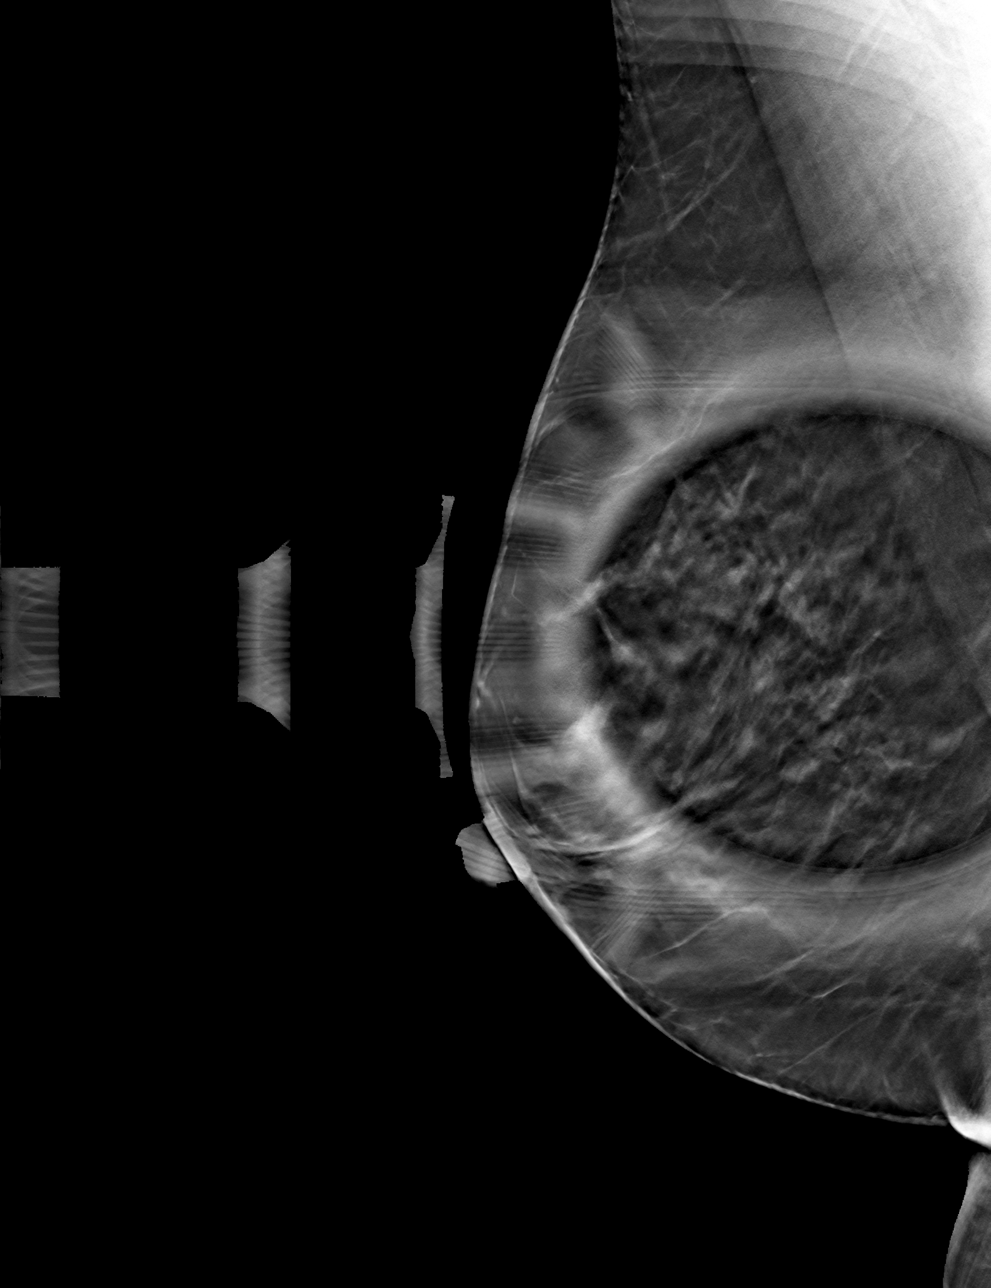

[R CC tomo · tomo slice 31/60.0]
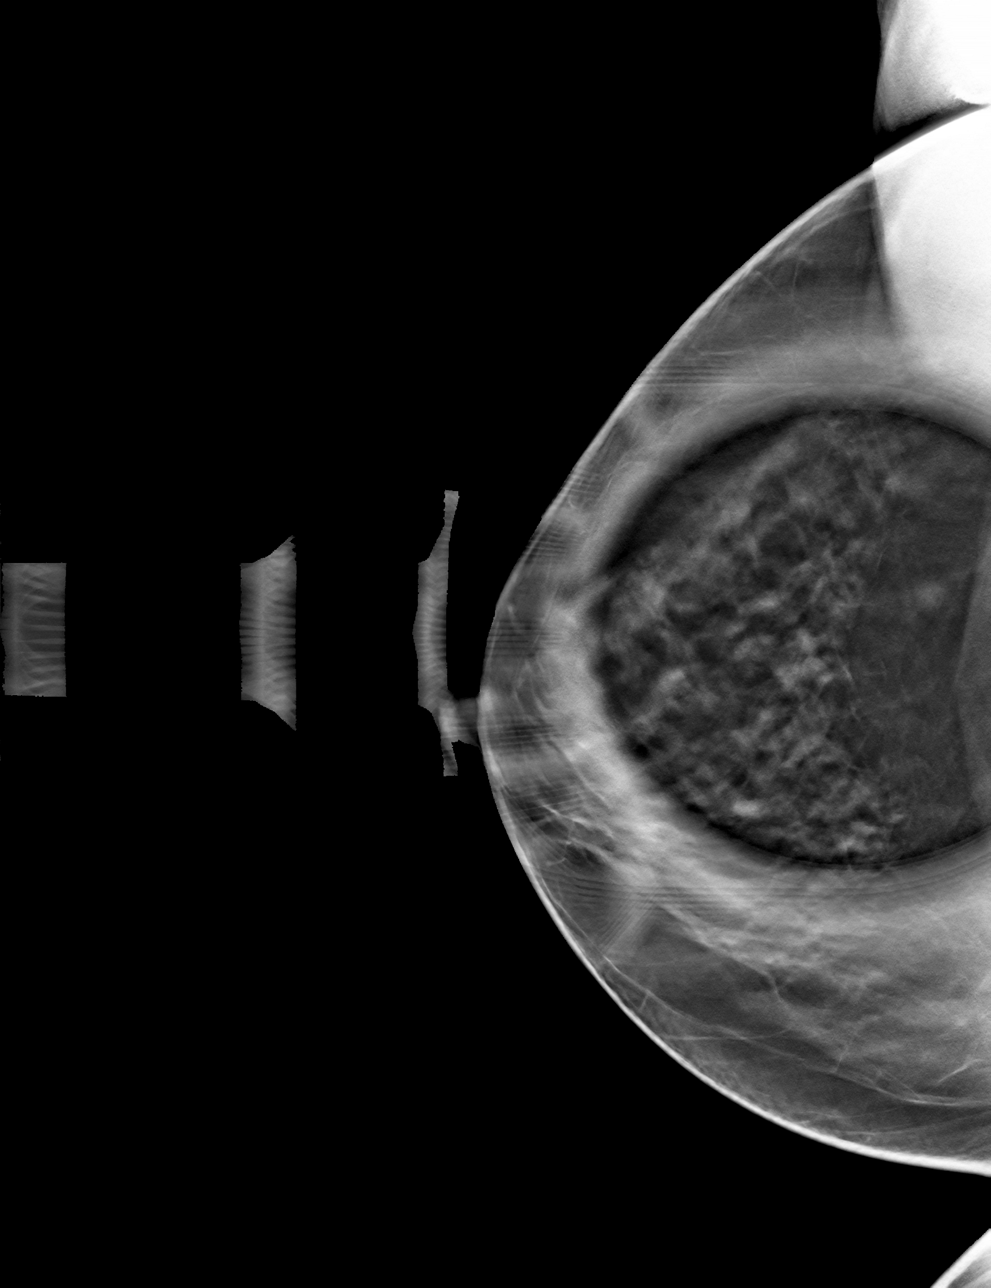

[4 of 12 positions shown; findings below may reference images not displayed]

ACR Breast Density Category c: The breast tissue is heterogeneously
dense, which may obscure small masses.
FINDINGS: There is a persistent circumscribed, slightly irregular mass in the
deep central right breast. It measures approximately 6 mm. No
additional suspicious mammographic findings identified.

Mammographic images were processed with CAD.

Targeted ultrasound is performed, showing heterogenous
fibroglandular tissue without focal or suspicious sonographic
abnormalities in the deep central right breast. Extensive evaluation
was performed but no ultrasound correlate to the mammographic
abnormality could be identified.

Evaluation of the right axilla demonstrates no suspicious
lymphadenopathy.
IMPRESSION: 1. Indeterminate, slightly irregular right breast mass without
ultrasound correlate. Recommendation is for stereotactic biopsy.
2. No suspicious right axillary lymphadenopathy.

RECOMMENDATION:
Stereotactic biopsy of the right breast.

I have discussed the findings and recommendations with the patient.
If applicable, a reminder letter will be sent to the patient
regarding the next appointment.

BI-RADS CATEGORY  4: Suspicious.

## 2021-01-20 ENCOUNTER — Telehealth: Payer: Self-pay | Admitting: Family Medicine

## 2021-01-20 DIAGNOSIS — E559 Vitamin D deficiency, unspecified: Secondary | ICD-10-CM

## 2021-01-20 NOTE — Telephone Encounter (Signed)
Requested medications are due for refill today.  yes  Requested medications are on the active medications list.  yes  Last refill. 08/08/2020  Future visit scheduled.   yes  Notes to clinic.  Medication not delegated.

## 2021-01-21 ENCOUNTER — Other Ambulatory Visit: Payer: Self-pay

## 2021-01-21 DIAGNOSIS — E559 Vitamin D deficiency, unspecified: Secondary | ICD-10-CM

## 2021-01-21 NOTE — Telephone Encounter (Signed)
Pt has an appt on 02/09/21

## 2021-02-05 NOTE — Progress Notes (Signed)
Name: Sara Randolph   MRN: [id number]    DOB: 1960/09/08   Date:02/09/2021       Progress Note  Subjective  Chief Complaint  Follow Up  HPI  B12 and Vitamin D deficiency: last levels were normal, she is taking supplements   Plantar fascitis:under the care of Dr. Amalia Hailey , worse on left and had steroid injection and is doing  better ,s till has some flares    Hyperlipidemia:she has noticed muscle aches and fatigue with statin therapy, she is now on Zetia and last LDL improved , down from 180's to 125 , we  will recheck in Nov    Pre-diabetes: she is on a low sugar diet, denies polyphagia, polydipsia or polyuria, seen by Dr. Gabriel Carina, on life style modification only. she was on Aquilla from April 1st, 2021 to Dec 2021, weight was down to 136 lbs but gained a little weight over the holidays , her weight is now at 159 lbs , she states she is going to try a more balanced diet now    GERD: she states her GERD symptoms have been bad again, she states taking Propel makes if flare, she would like to take prilosec again, no blood in stools or change in bowel movements, no dysphagia   Low back pain with radiculitis: going on for over one year, she has neurosurgeon Dr. Delice Lesch , she had a normal MRI in 2020 - unable to see the results. She states no recent flares, she takes Gabapentin prn when she notices right lower extremity paresthesia. Unchanged    Lung nodule: seen by Dr. Raul Del in the past and last CT was stable and released back in 2015 .Unchanged    Perennial AR: she states symptoms worse this year, taking Loratadine in am and Xyzal at night , she is down to once a day medication since June, curently doing well.   History of Fall: tripped on a bag Dec 2022 , fell on right shoulder pain was intense and radiculitis symptoms right hand. She is seeing Dr.Shupple and had steroid injections and improved symptoms, MRI shoulder only showed arthritis   Patient Active Problem List   Diagnosis Date  Noted   Impingement syndrome of right shoulder region 11/06/2020   Statin myopathy 08/08/2020   Hypercholesteremia 08/08/2020   B12 deficiency 08/08/2020   Hyperglycemia 08/08/2020   Lumbar back pain with radiculopathy affecting right lower extremity 08/31/2018   Vitamin D deficiency 02/24/2017   BPPV (benign paroxysmal positional vertigo), unspecified laterality 08/25/2016   Peripheral vertigo 01/13/2015   Dyslipidemia 01/13/2015   Cold sore 01/13/2015   Gastric reflux 01/13/2015   Perennial allergic rhinitis 01/13/2015   Menopausal symptom 01/13/2015   History of concussion 01/18/2013    Past Surgical History:  Procedure Laterality Date   ABDOMINAL HYSTERECTOMY  1988   BREAST BIOPSY Right 07/04/2020   stereo bx, x-clip, FATTY BREAST TISSUE WITH LIMITED   CHOLECYSTECTOMY     DILATION AND CURETTAGE OF UTERUS     ECTOPIC PREGNANCY SURGERY     RHINOPLASTY  1990   SALPINGECTOMY     TONSILLECTOMY  1969   WRIST SURGERY  1983    Family History  Problem Relation Age of Onset   Alzheimer's disease Mother    Parkinsonism Father    Heart disease Brother    Heart attack Brother    Depression Sister    Osteoporosis Sister    Breast cancer Neg Hx     Social History  Tobacco Use   Smoking status: Never   Smokeless tobacco: Never  Substance Use Topics   Alcohol use: Yes    Alcohol/week: 0.0 standard drinks    Comment: Consumes alcohol on Saturday     Current Outpatient Medications:    ezetimibe (ZETIA) 10 MG tablet, Take 1 tablet (10 mg total) by mouth daily., Disp: 90 tablet, Rfl: 1   gabapentin (NEURONTIN) 300 MG capsule, Take 1 capsule (300 mg total) by mouth at bedtime., Disp: 90 capsule, Rfl: 1   levocetirizine (XYZAL) 5 MG tablet, Take 2.5 mg by mouth every evening. , Disp: , Rfl:    meloxicam (MOBIC) 15 MG tablet, Take 1 tablet (15 mg total) by mouth daily., Disp: 30 tablet, Rfl: 1   Multiple Vitamin (MULTIVITAMIN) tablet, Take 1 tablet by mouth daily., Disp: ,  Rfl:    mupirocin ointment (BACTROBAN) 2 %, Apply 1 application topically 2 (two) times daily. Apply to inner nares , use for up to 1 week, Disp: 22 g, Rfl: 0   valACYclovir (VALTREX) 1000 MG tablet, Take 1 tablet by mouth twice daily, Disp: 30 tablet, Rfl: 0   Vitamin D, Ergocalciferol, (DRISDOL) 1.25 MG (50000 UNIT) CAPS capsule, Take 1 capsule by mouth once a week, Disp: 12 capsule, Rfl: 0   acetaminophen (TYLENOL) 500 MG tablet, Take 1 tablet by mouth daily. (Patient not taking: Reported on 02/09/2021), Disp: , Rfl:   Allergies  Allergen Reactions   Crestor [Rosuvastatin]     Cause muscle cramps, dizziness, headache and overall fatigue.   Atorvastatin     Muscle ache   Pravastatin Itching    dizziness   Venlafaxine Other (See Comments)    dizziness   Hydrocodone Nausea Only    I personally reviewed active problem list, medication list, allergies, family history, social history, health maintenance with the patient/caregiver today.   ROS  Constitutional: Negative for fever or weight change.  Respiratory: Negative for cough and shortness of breath.   Cardiovascular: Negative for chest pain or palpitations.  Gastrointestinal: Negative for abdominal pain, no bowel changes.  Musculoskeletal: Negative for gait problem or joint swelling.  Skin: Negative for rash.  Neurological: Negative for dizziness or headache.  No other specific complaints in a complete review of systems (except as listed in HPI above).   Objective  Vitals:   02/09/21 1109  BP: 116/68  Pulse: 76  Resp: 16  Temp: 98 F (36.7 C)  TempSrc: Oral  SpO2: 97%  Weight: 159 lb (72.1 kg)  Height: 5\' 2"  (1.575 m)    Body mass index is 29.08 kg/m.  Physical Exam  Constitutional: Patient appears well-developed and well-nourished. Obese  No distress.  HEENT: head atraumatic, normocephalic, pupils equal and reactive to light, neck supple Cardiovascular: Normal rate, regular rhythm and normal heart sounds.  No  murmur heard. No BLE edema. Pulmonary/Chest: Effort normal and breath sounds normal. No respiratory distress. Abdominal: Soft.  There is no tenderness. Psychiatric: Patient has a normal mood and affect. behavior is normal. Judgment and thought content normal.    PHQ2/9: Depression screen [facility] 2/9 02/09/2021 08/08/2020 06/05/2020 06/05/2020 04/28/2020  Decreased Interest 0 0 0 0 0  Down, Depressed, Hopeless 0 0 0 0 0  PHQ - 2 Score 0 0 0 0 0  Altered sleeping - - 3 - -  Tired, decreased energy - - 3 - -  Change in appetite - - 0 - -  Feeling bad or failure about yourself  - - 0 - -  Trouble concentrating - - 0 - -  Moving slowly or fidgety/restless - - 0 - -  Suicidal thoughts - - 0 - -  PHQ-9 Score - - 6 - -  Difficult doing work/chores - - Not difficult at all - -  Some recent data might be hidden    phq 9 is negative   Fall Risk: Fall Risk  02/09/2021 08/08/2020 04/28/2020 02/06/2020 08/09/2019  Falls in the past year? 1 1 0 0 0  Number falls in past yr: 0 0 0 0 0  Comment - - - - -  Injury with Fall? 1 1 0 0 0  Comment - - - - -  Follow up - - - - -      Functional Status Survey: Is the patient deaf or have difficulty hearing?: Yes Does the patient have difficulty seeing, even when wearing glasses/contacts?: No Does the patient have difficulty concentrating, remembering, or making decisions?: No Does the patient have difficulty walking or climbing stairs?: No Does the patient have difficulty dressing or bathing?: No Does the patient have difficulty doing errands alone such as visiting a doctor's office or shopping?: No   Assessment & Plan  1. Hypercholesteremia  - ezetimibe (ZETIA) 10 MG tablet; Take 1 tablet (10 mg total) by mouth daily.  Dispense: 90 tablet; Refill: 1  2. Lumbar back pain with radiculopathy affecting right lower extremity   3. B12 deficiency  Continue supplementation   4. Gastroesophageal reflux disease without esophagitis   5. Perennial allergic  rhinitis   6. Vitamin D deficiency   7. Plantar fasciitis, left   8. Gastroesophageal reflux disease with esophagitis without hemorrhage  - omeprazole (PRILOSEC) 40 MG capsule; Take 1 capsule (40 mg total) by mouth daily.  Dispense: 90 capsule; Refill: 1  9. Dyslipidemia   10. Statin myopathy   11. Cold sore  - valACYclovir (VALTREX) 1000 MG tablet; Take 1 tablet (1,000 mg total) by mouth 2 (two) times daily.  Dispense: 30 tablet; Refill: 0

## 2021-02-06 ENCOUNTER — Ambulatory Visit: Payer: Federal, State, Local not specified - PPO | Admitting: Family Medicine

## 2021-02-09 ENCOUNTER — Ambulatory Visit: Payer: Federal, State, Local not specified - PPO | Admitting: Family Medicine

## 2021-02-09 ENCOUNTER — Encounter: Payer: Self-pay | Admitting: Family Medicine

## 2021-02-09 ENCOUNTER — Other Ambulatory Visit: Payer: Self-pay

## 2021-02-09 VITALS — BP 116/68 | HR 76 | Temp 98.0°F | Resp 16 | Ht 62.0 in | Wt 159.0 lb

## 2021-02-09 DIAGNOSIS — J3089 Other allergic rhinitis: Secondary | ICD-10-CM

## 2021-02-09 DIAGNOSIS — K2100 Gastro-esophageal reflux disease with esophagitis, without bleeding: Secondary | ICD-10-CM

## 2021-02-09 DIAGNOSIS — M5416 Radiculopathy, lumbar region: Secondary | ICD-10-CM | POA: Diagnosis not present

## 2021-02-09 DIAGNOSIS — E559 Vitamin D deficiency, unspecified: Secondary | ICD-10-CM

## 2021-02-09 DIAGNOSIS — M722 Plantar fascial fibromatosis: Secondary | ICD-10-CM

## 2021-02-09 DIAGNOSIS — E7800 Pure hypercholesterolemia, unspecified: Secondary | ICD-10-CM | POA: Diagnosis not present

## 2021-02-09 DIAGNOSIS — K219 Gastro-esophageal reflux disease without esophagitis: Secondary | ICD-10-CM | POA: Diagnosis not present

## 2021-02-09 DIAGNOSIS — E538 Deficiency of other specified B group vitamins: Secondary | ICD-10-CM | POA: Diagnosis not present

## 2021-02-09 DIAGNOSIS — B001 Herpesviral vesicular dermatitis: Secondary | ICD-10-CM

## 2021-02-09 DIAGNOSIS — G720 Drug-induced myopathy: Secondary | ICD-10-CM

## 2021-02-09 DIAGNOSIS — T466X5A Adverse effect of antihyperlipidemic and antiarteriosclerotic drugs, initial encounter: Secondary | ICD-10-CM

## 2021-02-09 DIAGNOSIS — E785 Hyperlipidemia, unspecified: Secondary | ICD-10-CM

## 2021-02-09 MED ORDER — EZETIMIBE 10 MG PO TABS: 10.0000 mg | ORAL_TABLET | Freq: Every day | ORAL | 1 refills | Status: DC

## 2021-02-09 MED ORDER — VALACYCLOVIR HCL 1 G PO TABS: 1000.0000 mg | ORAL_TABLET | Freq: Two times a day (BID) | ORAL | 0 refills | Status: DC

## 2021-02-09 MED ORDER — OMEPRAZOLE 40 MG PO CPDR: 40.0000 mg | DELAYED_RELEASE_CAPSULE | Freq: Every day | ORAL | 1 refills | Status: DC

## 2021-03-25 ENCOUNTER — Other Ambulatory Visit: Payer: Self-pay | Admitting: Family Medicine

## 2021-03-25 DIAGNOSIS — B001 Herpesviral vesicular dermatitis: Secondary | ICD-10-CM

## 2021-05-21 ENCOUNTER — Other Ambulatory Visit: Payer: Self-pay | Admitting: Family Medicine

## 2021-05-21 DIAGNOSIS — Z1231 Encounter for screening mammogram for malignant neoplasm of breast: Secondary | ICD-10-CM

## 2021-05-25 ENCOUNTER — Ambulatory Visit: Payer: Federal, State, Local not specified - PPO | Admitting: Podiatry

## 2021-05-25 ENCOUNTER — Other Ambulatory Visit: Payer: Self-pay

## 2021-05-25 DIAGNOSIS — M722 Plantar fascial fibromatosis: Secondary | ICD-10-CM

## 2021-05-25 MED ORDER — METHYLPREDNISOLONE 4 MG PO TBPK: ORAL_TABLET | ORAL | 0 refills | Status: DC

## 2021-05-25 MED ORDER — MELOXICAM 15 MG PO TABS: 15.0000 mg | ORAL_TABLET | Freq: Every day | ORAL | 1 refills | Status: DC

## 2021-05-25 MED ORDER — BETAMETHASONE SOD PHOS & ACET 6 (3-3) MG/ML IJ SUSP
3.0000 mg | Freq: Once | INTRAMUSCULAR | Status: AC
  Administered 2021-05-25: 3 mg via INTRA_ARTICULAR

## 2021-05-25 NOTE — Progress Notes (Signed)
   Subjective: 60 y.o. female presenting today for evaluation of an acute flareup of bilateral heel pain.  She has had injections in the past which helped significantly.  She is requesting an injection today.  She presents for further treatment and evaluation   Past Medical History:  Diagnosis Date   Allergy    Anxiety    Asthma    Concussion with no loss of consciousness 02/28/2013   GERD (gastroesophageal reflux disease)    Hemorrhoids    High cholesterol    Lung nodule    Seborrheic keratosis      Objective: Physical Exam General: The patient is alert and oriented x3 in no acute distress.  Dermatology: Skin is warm, dry and supple bilateral lower extremities. Negative for open lesions or macerations bilateral.   Vascular: Dorsalis Pedis and Posterior Tibial pulses palpable bilateral.  Capillary fill time is immediate to all digits.  Neurological: Epicritic and protective threshold intact bilateral.   Musculoskeletal: Tenderness to palpation to the plantar aspect of the bilateral heels along the plantar fascia. All other joints range of motion within normal limits bilateral. Strength 5/5 in all groups bilateral.   Assessment: 1. plantar fasciitis bilateral feet  Plan of Care:  1. Patient evaluated. Xrays reviewed.   2. Injection of 0.5cc Celestone soluspan injected into the bilateral heels.  3. Rx for Medrol Dose Pak placed 4. Rx for Meloxicam ordered for patient. 5.  Continue wearing [employer] running shoes 6. Instructed patient regarding therapies and modalities at home to alleviate symptoms.  7. Return to clinic in 4 weeks.    *Goes by [employer].  Edrick Kins, DPM [facility]  Dr. Edrick Kins, DPM    [address], [place] [address]                Office [phone]  Fax [phone]

## 2021-06-08 NOTE — Progress Notes (Signed)
Name: Sara Randolph   MRN: [id number]    DOB: 01-Sep-1960   Date:06/09/2021       Progress Note  Subjective  Chief Complaint  Annual Exam  HPI  Patient presents for annual CPE.  Diet: balanced diet  Exercise: needs to try walking on a regular basis once foot feels better    [place] [facility] Visit from 08/08/2020 in [facility]  AUDIT-C Score 0      Depression: Phq 9 is  positive Depression screen [facility] 2/9 06/09/2021 02/09/2021 08/08/2020 06/05/2020 06/05/2020  Decreased Interest 0 0 0 0 0  Down, Depressed, Hopeless 3 0 0 0 0  PHQ - 2 Score 3 0 0 0 0  Altered sleeping 3 - - 3 -  Tired, decreased energy 0 - - 3 -  Change in appetite 0 - - 0 -  Feeling bad or failure about yourself  0 - - 0 -  Trouble concentrating 0 - - 0 -  Moving slowly or fidgety/restless 0 - - 0 -  Suicidal thoughts 0 - - 0 -  PHQ-9 Score 6 - - 6 -  Difficult doing work/chores - - - Not difficult at all -  Some recent data might be hidden   Hypertension: BP Readings from Last 3 Encounters:  06/09/21 118/68  02/09/21 116/68  08/08/20 118/64   Obesity: Wt Readings from Last 3 Encounters:  06/09/21 163 lb (73.9 kg)  02/09/21 159 lb (72.1 kg)  08/08/20 142 lb 6.4 oz (64.6 kg)   BMI Readings from Last 3 Encounters:  06/09/21 29.81 kg/m  02/09/21 29.08 kg/m  08/08/20 26.05 kg/m     Vaccines:   Shingrix: up to date  Pneumonia: educated and discussed with patient. Flu: up to date  COVID-19: she will get bivalent booster   Hep C Screening: 12/05/13 STD testing and prevention (HIV/chl/gon/syphilis): N/A Intimate partner violence: negative Sexual History :not sexually active since early 2022, he has ED, he is late 100's  Menstrual History/LMP/Abnormal Bleeding: s/p hysterectomy after ectopic pregnancy and bleeding  Incontinence Symptoms: she has urgency when bladder is full , occasional problems.   Breast cancer:  - Last Mammogram: Scheduled 07/16/21 - BRCA gene  screening: N/A  Osteoporosis: Discussed high calcium and vitamin D supplementation, weight bearing exercises  Cervical cancer screening: hysterectomy   Skin cancer: Discussed monitoring for atypical lesions  Colorectal cancer: 10/08/13   Lung cancer: Low Dose CT Chest recommended if Age 79-80 years, 20 pack-year currently smoking OR have quit w/in 15years. Patient does not qualify.   ECG: 06/10/10  Advanced Care Planning: A voluntary discussion about advance care planning including the explanation and discussion of advance directives.  Discussed health care proxy and Living will, and the patient was able to identify a health care proxy as husband.  Patient does not have a living will at present time. If patient does have living will, I have requested they bring this to the clinic to be scanned in to their chart.  Lipids: Lab Results  Component Value Date   CHOL 201 (H) 06/05/2020   CHOL 253 (H) 02/06/2020   CHOL 187 03/06/2019   Lab Results  Component Value Date   HDL 54 06/05/2020   HDL 52 02/06/2020   HDL 48 (L) 03/06/2019   Lab Results  Component Value Date   LDLCALC 125 (H) 06/05/2020   LDLCALC 180 (H) 02/06/2020   LDLCALC 110 (H) 03/06/2019   Lab Results  Component Value Date  TRIG 112 06/05/2020   TRIG 92 02/06/2020   TRIG 172 (H) 03/06/2019   Lab Results  Component Value Date   CHOLHDL 3.7 06/05/2020   CHOLHDL 4.9 02/06/2020   CHOLHDL 3.9 03/06/2019   No results found for: LDLDIRECT  Glucose: Glucose, Bld  Date Value Ref Range Status  06/05/2020 90 65 - 99 mg/dL Final    Comment:    .            Fasting reference interval .   02/06/2020 91 65 - 99 mg/dL Final    Comment:    .            Fasting reference interval .   03/06/2019 96 65 - 99 mg/dL Final    Comment:    .            Fasting reference interval .     Patient Active Problem List   Diagnosis Date Noted   Impingement syndrome of right shoulder region 11/06/2020   Statin myopathy  08/08/2020   Hypercholesteremia 08/08/2020   B12 deficiency 08/08/2020   Hyperglycemia 08/08/2020   Lumbar back pain with radiculopathy affecting right lower extremity 08/31/2018   Vitamin D deficiency 02/24/2017   BPPV (benign paroxysmal positional vertigo), unspecified laterality 08/25/2016   Peripheral vertigo 01/13/2015   Dyslipidemia 01/13/2015   Cold sore 01/13/2015   Gastric reflux 01/13/2015   Perennial allergic rhinitis 01/13/2015   Menopausal symptom 01/13/2015   History of concussion 01/18/2013    Past Surgical History:  Procedure Laterality Date   ABDOMINAL HYSTERECTOMY  1988   BREAST BIOPSY Right 07/04/2020   stereo bx, x-clip, FATTY BREAST TISSUE WITH LIMITED   CHOLECYSTECTOMY     DILATION AND CURETTAGE OF UTERUS     ECTOPIC PREGNANCY SURGERY     RHINOPLASTY  1990   SALPINGECTOMY     TONSILLECTOMY  1969   WRIST SURGERY  1983    Family History  Problem Relation Age of Onset   Alzheimer's disease Mother    Parkinsonism Father    Heart disease Brother    Heart attack Brother    Depression Sister    Osteoporosis Sister    Breast cancer Neg Hx     Social History   Socioeconomic History   Marital status: Married    Spouse name: Buddy   Number of children: 2   Years of education: Not on file   Highest education level: Not on file  Occupational History   Occupation: Sales executive  Tobacco Use   Smoking status: Never   Smokeless tobacco: Never  Vaping Use   Vaping Use: Never used  Substance and Sexual Activity   Alcohol use: Yes    Alcohol/week: 0.0 standard drinks    Comment: Consumes alcohol on Saturday   Drug use: No   Sexual activity: Yes    Partners: Male  Other Topics Concern   Not on file  Social History Narrative   Not on file   Social Determinants of Health   Financial Resource Strain: Low Risk    Difficulty of Paying Living Expenses: Not hard at all  Food Insecurity: No Food Insecurity   Worried About Charity fundraiser in the Last  Year: Never true   [place] in the Last Year: Never true  Transportation Needs: No Transportation Needs   Lack of Transportation (Medical): No   Lack of Transportation (Non-Medical): No  Physical Activity: Inactive   Days of Exercise per Week: 0 days  Minutes of Exercise per Session: 0 min  Stress: Stress Concern Present   Feeling of Stress : Very much  Social Connections: Moderately Isolated   Frequency of Communication with Friends and Family: More than three times a week   Frequency of Social Gatherings with Friends and Family: More than three times a week   Attends Religious Services: Never   Marine scientist or Organizations: No   Attends Music therapist: Never   Marital Status: Married  Human resources officer Violence: Not At Risk   Fear of Current or Ex-Partner: No   Emotionally Abused: No   Physically Abused: No   Sexually Abused: No     Current Outpatient Medications:    ezetimibe (ZETIA) 10 MG tablet, Take 1 tablet (10 mg total) by mouth daily., Disp: 90 tablet, Rfl: 1   gabapentin (NEURONTIN) 300 MG capsule, Take 1 capsule (300 mg total) by mouth at bedtime., Disp: 90 capsule, Rfl: 1   levocetirizine (XYZAL) 5 MG tablet, Take 2.5 mg by mouth every evening. , Disp: , Rfl:    meloxicam (MOBIC) 15 MG tablet, Take 1 tablet (15 mg total) by mouth daily., Disp: 30 tablet, Rfl: 1   Multiple Vitamin (MULTIVITAMIN) tablet, Take 1 tablet by mouth daily., Disp: , Rfl:    omeprazole (PRILOSEC) 40 MG capsule, Take 1 capsule (40 mg total) by mouth daily., Disp: 90 capsule, Rfl: 1   valACYclovir (VALTREX) 1000 MG tablet, Take 1 tablet by mouth twice daily, Disp: 30 tablet, Rfl: 0   Vitamin D, Ergocalciferol, (DRISDOL) 1.25 MG (50000 UNIT) CAPS capsule, Take 1 capsule by mouth once a week, Disp: 12 capsule, Rfl: 0  Allergies  Allergen Reactions   Crestor [Rosuvastatin]     Cause muscle cramps, dizziness, headache and overall fatigue.   Atorvastatin     Muscle  ache   Pravastatin Itching    dizziness   Venlafaxine Other (See Comments)    dizziness   Hydrocodone Nausea Only     ROS  Constitutional: Negative for fever or weight change.  Respiratory: Negative for cough and shortness of breath.   Cardiovascular: Negative for chest pain or palpitations.  Gastrointestinal: Negative for abdominal pain, no bowel changes.  Musculoskeletal: Negative for gait problem or joint swelling.  Skin: Negative for rash.  Neurological: Negative for dizziness or headache.  No other specific complaints in a complete review of systems (except as listed in HPI above).   Objective  Vitals:   06/09/21 0757  BP: 118/68  Pulse: 68  Resp: 16  Temp: 97.8 F (36.6 C)  SpO2: 98%  Weight: 163 lb (73.9 kg)  Height: '5\' 2"'  (1.575 m)    Body mass index is 29.81 kg/m.  Physical Exam  Constitutional: Patient appears well-developed and well-nourished. No distress.  HENT: Head: Normocephalic and atraumatic. Ears: B TMs ok, no erythema or effusion; Nose: Not done  Mouth/Throat: not done  Eyes: Conjunctivae and EOM are normal. Pupils are equal, round, and reactive to light. No scleral icterus.  Neck: Normal range of motion. Neck supple. No JVD present. No thyromegaly present.  Cardiovascular: Normal rate, regular rhythm and normal heart sounds.  No murmur heard. No BLE edema. Pulmonary/Chest: Effort normal and breath sounds normal. No respiratory distress. Abdominal: Soft. Bowel sounds are normal, no distension. There is no tenderness. no masses Breast: no lumps or masses, no nipple discharge or rashes FEMALE GENITALIA:  Not done  RECTAL: not done  Musculoskeletal: Normal range of motion, no joint effusions.  No gross deformities Neurological: he is alert and oriented to person, place, and time. No cranial nerve deficit. Coordination, balance, strength, speech and gait are normal.  Skin: Skin is warm and dry. No rash noted. No erythema.  Psychiatric: Patient has a  normal mood and affect. behavior is normal. Judgment and thought content normal.    Fall Risk: Fall Risk  06/09/2021 02/09/2021 08/08/2020 04/28/2020 02/06/2020  Falls in the past year? 0 1 1 0 0  Number falls in past yr: 0 0 0 0 0  Comment - - - - -  Injury with Fall? 0 1 1 0 0  Comment - - - - -  Follow up - - - - -     Functional Status Survey: Is the patient deaf or have difficulty hearing?: Yes Does the patient have difficulty seeing, even when wearing glasses/contacts?: No Does the patient have difficulty concentrating, remembering, or making decisions?: No Does the patient have difficulty walking or climbing stairs?: No Does the patient have difficulty dressing or bathing?: No Does the patient have difficulty doing errands alone such as visiting a doctor's office or shopping?: No   Assessment & Plan  1. Well adult exam   2. Breast cancer screening by mammogram  Already scheduled    -USPSTF grade A and B recommendations reviewed with patient; age-appropriate recommendations, preventive care, screening tests, etc discussed and encouraged; healthy living encouraged; see AVS for patient education given to patient -Discussed importance of 150 minutes of physical activity weekly, eat two servings of fish weekly, eat one serving of tree nuts ( cashews, pistachios, pecans, almonds.Marland Kitchen) every other day, eat 6 servings of fruit/vegetables daily and drink plenty of water and avoid sweet beverages.

## 2021-06-09 ENCOUNTER — Ambulatory Visit (INDEPENDENT_AMBULATORY_CARE_PROVIDER_SITE_OTHER): Payer: Federal, State, Local not specified - PPO | Admitting: Family Medicine

## 2021-06-09 ENCOUNTER — Encounter: Payer: Self-pay | Admitting: Family Medicine

## 2021-06-09 ENCOUNTER — Other Ambulatory Visit: Payer: Self-pay

## 2021-06-09 VITALS — BP 118/68 | HR 68 | Temp 97.8°F | Resp 16 | Ht 62.0 in | Wt 163.0 lb

## 2021-06-09 DIAGNOSIS — Z1231 Encounter for screening mammogram for malignant neoplasm of breast: Secondary | ICD-10-CM

## 2021-06-09 DIAGNOSIS — Z0000 Encounter for general adult medical examination without abnormal findings: Secondary | ICD-10-CM

## 2021-06-09 NOTE — Patient Instructions (Signed)
Preventive Care 92-62 Years Old, Female Preventive care refers to lifestyle choices and visits with your health care provider that can promote health and wellness. Preventive care visits are also called wellness exams. What can I expect for my preventive care visit? Counseling Your health care provider may ask you questions about your: Medical history, including: Past medical problems. Family medical history. Pregnancy history. Current health, including: Menstrual cycle. Method of birth control. Emotional well-being. Home life and relationship well-being. Sexual activity and sexual health. Lifestyle, including: Alcohol, nicotine or tobacco, and drug use. Access to firearms. Diet, exercise, and sleep habits. Work and work Statistician. Sunscreen use. Safety issues such as seatbelt and bike helmet use. Physical exam Your health care provider will check your: Height and weight. These may be used to calculate your BMI (body mass index). BMI is a measurement that tells if you are at a healthy weight. Waist circumference. This measures the distance around your waistline. This measurement also tells if you are at a healthy weight and may help predict your risk of certain diseases, such as type 2 diabetes and high blood pressure. Heart rate and blood pressure. Body temperature. Skin for abnormal spots. What immunizations do I need? Vaccines are usually given at various ages, according to a schedule. Your health care provider will recommend vaccines for you based on your age, medical history, and lifestyle or other factors, such as travel or where you work. What tests do I need? Screening Your health care provider may recommend screening tests for certain conditions. This may include: Lipid and cholesterol levels. Diabetes screening. This is done by checking your blood sugar (glucose) after you have not eaten for a while (fasting). Pelvic exam and Pap test. Hepatitis B test. Hepatitis C  test. HIV (human immunodeficiency virus) test. STI (sexually transmitted infection) testing, if you are at risk. Lung cancer screening. Colorectal cancer screening. Mammogram. Talk with your health care provider about when you should start having regular mammograms. This may depend on whether you have a family history of breast cancer. BRCA-related cancer screening. This may be done if you have a family history of breast, ovarian, tubal, or peritoneal cancers. Bone density scan. This is done to screen for osteoporosis. Talk with your health care provider about your test results, treatment options, and if necessary, the need for more tests. Follow these instructions at home: Eating and drinking  Eat a diet that includes fresh fruits and vegetables, whole grains, lean protein, and low-fat dairy products. Take vitamin and mineral supplements as recommended by your health care provider. Do not drink alcohol if: Your health care provider tells you not to drink. You are pregnant, may be pregnant, or are planning to become pregnant. If you drink alcohol: Limit how much you have to 0-1 drink a day. Know how much alcohol is in your drink. In the [place] one drink equals one 12 oz bottle of beer (355 mL), one 5 oz glass of wine (148 mL), or one 1 oz glass of hard liquor (44 mL). Lifestyle Brush your teeth every morning and night with fluoride toothpaste. Floss one time each day. Exercise for at least 30 minutes 5 or more days each week. Do not use any products that contain nicotine or tobacco. These products include cigarettes, chewing tobacco, and vaping devices, such as e-cigarettes. If you need help quitting, ask your health care provider. Do not use drugs. If you are sexually active, practice safe sex. Use a condom or other form of protection to prevent  STIs. If you do not wish to become pregnant, use a form of birth control. If you plan to become pregnant, see your health care provider for a  prepregnancy visit. Take aspirin only as told by your health care provider. Make sure that you understand how much to take and what form to take. Work with your health care provider to find out whether it is safe and beneficial for you to take aspirin daily. Find healthy ways to manage stress, such as: Meditation, yoga, or listening to music. Journaling. Talking to a trusted person. Spending time with friends and family. Minimize exposure to UV radiation to reduce your risk of skin cancer. Safety Always wear your seat belt while driving or riding in a vehicle. Do not drive: If you have been drinking alcohol. Do not ride with someone who has been drinking. When you are tired or distracted. While texting. If you have been using any mind-altering substances or drugs. Wear a helmet and other protective equipment during sports activities. If you have firearms in your house, make sure you follow all gun safety procedures. Seek help if you have been physically or sexually abused. What's next? Visit your health care provider once a year for an annual wellness visit. Ask your health care provider how often you should have your eyes and teeth checked. Stay up to date on all vaccines. This information is not intended to replace advice given to you by your health care provider. Make sure you discuss any questions you have with your health care provider. Document Revised: 01/14/2021 Document Reviewed: 01/14/2021 Elsevier Patient Education  [employer].

## 2021-06-24 ENCOUNTER — Encounter: Payer: Federal, State, Local not specified - PPO | Admitting: Family Medicine

## 2021-07-13 NOTE — Progress Notes (Signed)
Name: Sara Randolph   MRN: [id number]    DOB: 05-27-1961   Date:07/14/2021       Progress Note  Subjective  Chief Complaint  Stress/ Depression  HPI  Dysthymia/marital problems: she is currently in her 52rd marriage. They have been married since 1994. She states she is not happy with her husband. Feeling jealous that he hangs out with his brother, goes out to eat or play golf without her. She misses what it used to be. The relationship has been bad for the past year. It changed during the pandemic. He is 60 yo. Discussed counseling but she states he will not go with her. Discussed individual counseling. She feels anxious and has difficulty falling and staying asleep even with melatonin .   Patient Active Problem List   Diagnosis Date Noted   Impingement syndrome of right shoulder region 11/06/2020   Statin myopathy 08/08/2020   Hypercholesteremia 08/08/2020   B12 deficiency 08/08/2020   Hyperglycemia 08/08/2020   Lumbar back pain with radiculopathy affecting right lower extremity 08/31/2018   Vitamin D deficiency 02/24/2017   BPPV (benign paroxysmal positional vertigo), unspecified laterality 08/25/2016   Peripheral vertigo 01/13/2015   Dyslipidemia 01/13/2015   Cold sore 01/13/2015   Gastric reflux 01/13/2015   Perennial allergic rhinitis 01/13/2015   Menopausal symptom 01/13/2015   History of concussion 01/18/2013    Past Surgical History:  Procedure Laterality Date   ABDOMINAL HYSTERECTOMY  1988   BREAST BIOPSY Right 07/04/2020   stereo bx, x-clip, FATTY BREAST TISSUE WITH LIMITED   CHOLECYSTECTOMY     DILATION AND CURETTAGE OF UTERUS     ECTOPIC PREGNANCY SURGERY     RHINOPLASTY  1990   SALPINGECTOMY     TONSILLECTOMY  1969   WRIST SURGERY  1983    Family History  Problem Relation Age of Onset   Alzheimer's disease Mother    Parkinsonism Father    Heart disease Brother    Heart attack Brother    Depression Sister    Osteoporosis Sister    Breast cancer  Neg Hx     Social History   Tobacco Use   Smoking status: Never   Smokeless tobacco: Never  Substance Use Topics   Alcohol use: Yes    Alcohol/week: 0.0 standard drinks    Comment: Consumes alcohol on Saturday     Current Outpatient Medications:    ezetimibe (ZETIA) 10 MG tablet, Take 1 tablet (10 mg total) by mouth daily., Disp: 90 tablet, Rfl: 1   gabapentin (NEURONTIN) 300 MG capsule, Take 1 capsule (300 mg total) by mouth at bedtime., Disp: 90 capsule, Rfl: 1   levocetirizine (XYZAL) 5 MG tablet, Take 2.5 mg by mouth every evening. , Disp: , Rfl:    meloxicam (MOBIC) 15 MG tablet, Take 1 tablet (15 mg total) by mouth daily., Disp: 30 tablet, Rfl: 1   Multiple Vitamin (MULTIVITAMIN) tablet, Take 1 tablet by mouth daily., Disp: , Rfl:    omeprazole (PRILOSEC) 40 MG capsule, Take 1 capsule (40 mg total) by mouth daily., Disp: 90 capsule, Rfl: 1   valACYclovir (VALTREX) 1000 MG tablet, Take 1 tablet by mouth twice daily, Disp: 30 tablet, Rfl: 0   Vitamin D, Ergocalciferol, (DRISDOL) 1.25 MG (50000 UNIT) CAPS capsule, Take 1 capsule by mouth once a week, Disp: 12 capsule, Rfl: 0  Allergies  Allergen Reactions   Crestor [Rosuvastatin]     Cause muscle cramps, dizziness, headache and overall fatigue.   Atorvastatin  Muscle ache   Pravastatin Itching    dizziness   Venlafaxine Other (See Comments)    dizziness   Hydrocodone Nausea Only    I personally reviewed active problem list, medication list, allergies, family history, social history, health maintenance with the patient/caregiver today.   ROS  Ten systems reviewed and is negative except as mentioned in HPI   Objective  Vitals:   07/14/21 1339  BP: 122/70  Pulse: 94  Resp: 16  Temp: 97.6 F (36.4 C)  SpO2: 98%  Weight: 165 lb (74.8 kg)  Height: 5\' 2"  (1.575 m)    Body mass index is 30.18 kg/m.  Physical Exam  Constitutional: Patient appears well-developed and well-nourished. Obese  No distress.   HEENT: head atraumatic, normocephalic, pupils equal and reactive to light,  neck supple Cardiovascular: Normal rate, regular rhythm and normal heart sounds.  No murmur heard. No BLE edema. Pulmonary/Chest: Effort normal and breath sounds normal. No respiratory distress. Abdominal: Soft.  There is no tenderness. Psychiatric: Patient has a normal mood and affect. behavior is normal. Judgment and thought content normal.    PHQ2/9: Depression screen [facility] 2/9 07/14/2021 06/09/2021 02/09/2021 08/08/2020 06/05/2020  Decreased Interest 0 0 0 0 0  Down, Depressed, Hopeless 1 3 0 0 0  PHQ - 2 Score 1 3 0 0 0  Altered sleeping 1 3 - - 3  Tired, decreased energy 0 0 - - 3  Change in appetite 1 0 - - 0  Feeling bad or failure about yourself  0 0 - - 0  Trouble concentrating 0 0 - - 0  Moving slowly or fidgety/restless 0 0 - - 0  Suicidal thoughts 0 0 - - 0  PHQ-9 Score 3 6 - - 6  Difficult doing work/chores - - - - Not difficult at all  Some recent data might be hidden    phq 9 is positive   Fall Risk: Fall Risk  07/14/2021 06/09/2021 02/09/2021 08/08/2020 04/28/2020  Falls in the past year? 0 0 1 1 0  Number falls in past yr: 0 0 0 0 0  Comment - - - - -  Injury with Fall? 0 0 1 1 0  Comment - - - - -  Risk for fall due to : No Fall Risks - - - -  Follow up Falls prevention discussed - - - -      Functional Status Survey: Is the patient deaf or have difficulty hearing?: Yes Does the patient have difficulty seeing, even when wearing glasses/contacts?: No Does the patient have difficulty concentrating, remembering, or making decisions?: No Does the patient have difficulty walking or climbing stairs?: No Does the patient have difficulty dressing or bathing?: No Does the patient have difficulty doing errands alone such as visiting a doctor's office or shopping?: No    Assessment & Plan  1. Dysthymia  - escitalopram (LEXAPRO) 10 MG tablet; Take 1 tablet (10 mg total) by mouth in the  morning.  Dispense: 30 tablet; Refill: 0  2. Marital problem   3. Anxiety  - escitalopram (LEXAPRO) 10 MG tablet; Take 1 tablet (10 mg total) by mouth in the morning.  Dispense: 30 tablet; Refill: 0  4. Insomnia, unspecified type  - traZODone (DESYREL) 50 MG tablet; Take 0.5-1 tablets (25-50 mg total) by mouth at bedtime as needed for sleep.  Dispense: 30 tablet; Refill: 0

## 2021-07-14 ENCOUNTER — Encounter: Payer: Self-pay | Admitting: Family Medicine

## 2021-07-14 ENCOUNTER — Ambulatory Visit: Payer: Federal, State, Local not specified - PPO | Admitting: Family Medicine

## 2021-07-14 VITALS — BP 122/70 | HR 94 | Temp 97.6°F | Resp 16 | Ht 62.0 in | Wt 165.0 lb

## 2021-07-14 DIAGNOSIS — G4700 Insomnia, unspecified: Secondary | ICD-10-CM

## 2021-07-14 DIAGNOSIS — Z630 Problems in relationship with spouse or partner: Secondary | ICD-10-CM | POA: Diagnosis not present

## 2021-07-14 DIAGNOSIS — F419 Anxiety disorder, unspecified: Secondary | ICD-10-CM

## 2021-07-14 DIAGNOSIS — F341 Dysthymic disorder: Secondary | ICD-10-CM | POA: Diagnosis not present

## 2021-07-14 MED ORDER — ESCITALOPRAM OXALATE 10 MG PO TABS: 10.0000 mg | ORAL_TABLET | Freq: Every morning | ORAL | 0 refills | Status: DC

## 2021-07-14 MED ORDER — TRAZODONE HCL 50 MG PO TABS: 25.0000 mg | ORAL_TABLET | Freq: Every evening | ORAL | 0 refills | Status: DC | PRN

## 2021-07-16 ENCOUNTER — Other Ambulatory Visit: Payer: Self-pay

## 2021-07-16 ENCOUNTER — Ambulatory Visit
Admission: RE | Admit: 2021-07-16 | Discharge: 2021-07-16 | Disposition: A | Discharge status: Home / Self Care | Payer: Federal, State, Local not specified - PPO | Source: Ambulatory Visit | Attending: Family Medicine | Admitting: Family Medicine

## 2021-07-16 DIAGNOSIS — Z1231 Encounter for screening mammogram for malignant neoplasm of breast: Secondary | ICD-10-CM | POA: Diagnosis not present

## 2021-07-19 ENCOUNTER — Other Ambulatory Visit: Payer: Self-pay | Admitting: Family Medicine

## 2021-07-19 DIAGNOSIS — E559 Vitamin D deficiency, unspecified: Secondary | ICD-10-CM

## 2021-07-27 ENCOUNTER — Telehealth: Payer: Federal, State, Local not specified - PPO | Admitting: Family Medicine

## 2021-07-27 DIAGNOSIS — B9689 Other specified bacterial agents as the cause of diseases classified elsewhere: Secondary | ICD-10-CM

## 2021-07-27 DIAGNOSIS — J208 Acute bronchitis due to other specified organisms: Secondary | ICD-10-CM

## 2021-07-27 MED ORDER — ALBUTEROL SULFATE HFA 108 (90 BASE) MCG/ACT IN AERS: 2.0000 | INHALATION_SPRAY | Freq: Four times a day (QID) | RESPIRATORY_TRACT | 0 refills | Status: DC | PRN

## 2021-07-27 MED ORDER — AMOXICILLIN-POT CLAVULANATE 875-125 MG PO TABS: 1.0000 | ORAL_TABLET | Freq: Two times a day (BID) | ORAL | 0 refills | Status: DC

## 2021-07-27 NOTE — Progress Notes (Signed)
Virtual Visit Consent   [place], you are scheduled for a virtual visit with a [facility] provider today.     Just as with appointments in the office, your consent must be obtained to participate.  Your consent will be active for this visit and any virtual visit you may have with one of our providers in the next 365 days.     If you have a MyChart account, a copy of this consent can be sent to you electronically.  All virtual visits are billed to your insurance company just like a traditional visit in the office.    As this is a virtual visit, video technology does not allow for your provider to perform a traditional examination.  This may limit your provider's ability to fully assess your condition.  If your provider identifies any concerns that need to be evaluated in person or the need to arrange testing (such as labs, EKG, etc.), we will make arrangements to do so.     Although advances in technology are sophisticated, we cannot ensure that it will always work on either your end or our end.  If the connection with a video visit is poor, the visit may have to be switched to a telephone visit.  With either a video or telephone visit, we are not always able to ensure that we have a secure connection.     I need to obtain your verbal consent now.   Are you willing to proceed with your visit today?    Jiali Linney Ayyad has provided verbal consent on 07/27/2021 for a virtual visit (video or telephone).   Perlie Mayo, NP   Date: 07/27/2021 1:00 PM   Virtual Visit via Video Note   I, Perlie Mayo, connected with  Talita Recht Slaven  ([id number], 1961/01/04) on 07/27/21 at 12:45 PM EST by a video-enabled telemedicine application and verified that I am speaking with the correct person using two identifiers.  Location: Patient: Virtual Visit Location Patient: Home Provider: Virtual Visit Location Provider: Home Office   I discussed the limitations of evaluation and  management by telemedicine and the availability of in person appointments. The patient expressed understanding and agreed to proceed.    History of Present Illness: Sara Randolph is a 60 y.o. who identifies as a female who was assigned female at birth, and is being seen today for watched grand daughter on 12/18-19 as she was sick with cold- neg for flu, covid, and rsv.  On 12/21 she started with low grade fever- t max is 99.8, shortness of breath over the last 2 days-asthma that is seasonal- lungs feel tight- out of rescue inhaler, needs to keep cough drop in mouth, eyes are tired, head is hurting and ears are cracking sound.   Tested for COVID but was neg on 12/24 cause felt bad on 12/23 Vaccine and boosted.  Problems:  Patient Active Problem List   Diagnosis Date Noted   Impingement syndrome of right shoulder region 11/06/2020   Statin myopathy 08/08/2020   Hypercholesteremia 08/08/2020   B12 deficiency 08/08/2020   Hyperglycemia 08/08/2020   Lumbar back pain with radiculopathy affecting right lower extremity 08/31/2018   Vitamin D deficiency 02/24/2017   BPPV (benign paroxysmal positional vertigo), unspecified laterality 08/25/2016   Peripheral vertigo 01/13/2015   Dyslipidemia 01/13/2015   Cold sore 01/13/2015   Gastric reflux 01/13/2015   Perennial allergic rhinitis 01/13/2015   Menopausal symptom 01/13/2015   History of concussion 01/18/2013  Allergies:  Allergies  Allergen Reactions   Crestor [Rosuvastatin]     Cause muscle cramps, dizziness, headache and overall fatigue.   Atorvastatin     Muscle ache   Pravastatin Itching    dizziness   Venlafaxine Other (See Comments)    dizziness   Hydrocodone Nausea Only   Medications:  Current Outpatient Medications:    albuterol (VENTOLIN HFA) 108 (90 Base) MCG/ACT inhaler, Inhale 2 puffs into the lungs every 6 (six) hours as needed for wheezing or shortness of breath., Disp: 8 g, Rfl: 0   amoxicillin-clavulanate  (AUGMENTIN) 875-125 MG tablet, Take 1 tablet by mouth 2 (two) times daily for 7 days., Disp: 14 tablet, Rfl: 0   Vitamin D, Ergocalciferol, (DRISDOL) 1.25 MG (50000 UNIT) CAPS capsule, Take 1 capsule by mouth once a week, Disp: 12 capsule, Rfl: 0   escitalopram (LEXAPRO) 10 MG tablet, Take 1 tablet (10 mg total) by mouth in the morning., Disp: 30 tablet, Rfl: 0   ezetimibe (ZETIA) 10 MG tablet, Take 1 tablet (10 mg total) by mouth daily., Disp: 90 tablet, Rfl: 1   gabapentin (NEURONTIN) 300 MG capsule, Take 1 capsule (300 mg total) by mouth at bedtime., Disp: 90 capsule, Rfl: 1   levocetirizine (XYZAL) 5 MG tablet, Take 2.5 mg by mouth every evening. , Disp: , Rfl:    meloxicam (MOBIC) 15 MG tablet, Take 1 tablet (15 mg total) by mouth daily., Disp: 30 tablet, Rfl: 1   Multiple Vitamin (MULTIVITAMIN) tablet, Take 1 tablet by mouth daily., Disp: , Rfl:    omeprazole (PRILOSEC) 40 MG capsule, Take 1 capsule (40 mg total) by mouth daily., Disp: 90 capsule, Rfl: 1   traZODone (DESYREL) 50 MG tablet, Take 0.5-1 tablets (25-50 mg total) by mouth at bedtime as needed for sleep., Disp: 30 tablet, Rfl: 0   valACYclovir (VALTREX) 1000 MG tablet, Take 1 tablet by mouth twice daily, Disp: 30 tablet, Rfl: 0  Observations/Objective: Patient is well-developed, well-nourished in no acute distress.  Resting comfortably  at home.  Head is normocephalic, atraumatic.  No labored breathing.  Speech is clear and coherent with logical content.  Patient is alert and oriented at baseline.  Nasal tone  Assessment and Plan:  1. Acute bacterial bronchitis S&S consistent this post viral infection-Aug provided  Needs refill of inhaler  - albuterol (VENTOLIN HFA) 108 (90 Base) MCG/ACT inhaler; Inhale 2 puffs into the lungs every 6 (six) hours as needed for wheezing or shortness of breath.  Dispense: 8 g; Refill: 0 - amoxicillin-clavulanate (AUGMENTIN) 875-125 MG tablet; Take 1 tablet by mouth 2 (two) times daily for 7  days.  Dispense: 14 tablet; Refill: 0   Reviewed side effects, risks and benefits of medication.    Patient acknowledged agreement and understanding of the plan.   I discussed the assessment and treatment plan with the patient. The patient was provided an opportunity to ask questions and all were answered. The patient agreed with the plan and demonstrated an understanding of the instructions.   The patient was advised to call back or seek an in-person evaluation if the symptoms worsen or if the condition fails to improve as anticipated.   The above assessment and management plan was discussed with the patient. The patient verbalized understanding of and has agreed to the management plan. Patient is aware to call the clinic if symptoms persist or worsen. Patient is aware when to return to the clinic for a follow-up visit. Patient educated on when it is  appropriate to go to the emergency department.     Follow Up Instructions: I discussed the assessment and treatment plan with the patient. The patient was provided an opportunity to ask questions and all were answered. The patient agreed with the plan and demonstrated an understanding of the instructions.  A copy of instructions were sent to the patient via MyChart unless otherwise noted below.     The patient was advised to call back or seek an in-person evaluation if the symptoms worsen or if the condition fails to improve as anticipated.  Time:  I spent 10 minutes with the patient via telehealth technology discussing the above problems/concerns.    Perlie Mayo, NP

## 2021-07-27 NOTE — Patient Instructions (Signed)
I appreciate the opportunity to provide you with care for your health and wellness.  Take medication has directed  I hope you feel better soon  Please continue to practice social distancing to keep you, your family, and our community safe. If you must go out, please wear a mask and practice good handwashing.  Have a wonderful day. With Gratitude, Cherly Beach, DNP, AGNP-BC

## 2021-07-31 NOTE — Progress Notes (Signed)
Name: Sara Randolph   MRN: [id number]    DOB: 1960-12-17   Date:08/10/2021       Progress Note  Subjective  Chief Complaint  Follow Up  HPI  Dysthymia/Insomnia: seen in Dec for marital problems, she was  feeling anxious and also having insomnia, we started her on Lexapro and also Trazodone. She states sleeping is better, able to fall asleep , waking up to take the dog out but otherwise sleeping well. She states Lexapro caused dizziness during the first week but that has resolved. She states medication has mellowed her out, not reacting as quickly . She states she spoke to him about wanting to spend more time with him. Initially he was defensive but afterwards they spoke and they are both trying to stop the bickering cycle. They have been trying to do more things together, even if going to the store together   B12 and Vitamin D deficiency: last levels were normal, continue supplements    Hyperlipidemia:she has noticed muscle aches and fatigue with statin therapy, she is now on Zetia and last LDL improved , we will recheck labs today    Pre-diabetes: she is on a low sugar diet, denies polyphagia, polydipsia or polyuria, seen by Dr. Gabriel Carina, on life style modification only. she was on [place] from April 1st, 2021 to Dec 2021, weight was down to 136 lbs but gained a little weight over the holidays , she also got sick in Dec and has not been eating very well lately    GERD: she states her GERD symptoms have been bad again, she recently took Augmentin , taking omeprazole 40 mg in am, advised to add Pepcid AC qhs for the next week    Low back pain with radiculitis: going on for over one year, she has neurosurgeon Dr. Delice Lesch , she had a normal MRI in 2020 - unable to see the results. She states no recent flares, she takes Gabapentin prn when she notices right lower extremity paresthesia. She also takes Meloxicam prn    Lung nodule: seen by Dr. Raul Del in the past and last CT was stable and  released back in 2015 . Unchanged    Perennial LP:[id number] Loratadine in am and Xyzal at night from March to June but this time of the year takes one daily    Patient Active Problem List   Diagnosis Date Noted   Impingement syndrome of right shoulder region 11/06/2020   Statin myopathy 08/08/2020   Hypercholesteremia 08/08/2020   B12 deficiency 08/08/2020   Hyperglycemia 08/08/2020   Lumbar back pain with radiculopathy affecting right lower extremity 08/31/2018   Vitamin D deficiency 02/24/2017   BPPV (benign paroxysmal positional vertigo), unspecified laterality 08/25/2016   Peripheral vertigo 01/13/2015   Dyslipidemia 01/13/2015   Cold sore 01/13/2015   Gastric reflux 01/13/2015   Perennial allergic rhinitis 01/13/2015   Menopausal symptom 01/13/2015   History of concussion 01/18/2013    Past Surgical History:  Procedure Laterality Date   ABDOMINAL HYSTERECTOMY  1988   BREAST BIOPSY Right 07/04/2020   stereo bx, x-clip, FATTY BREAST TISSUE WITH LIMITED   CHOLECYSTECTOMY     DILATION AND CURETTAGE OF UTERUS     ECTOPIC PREGNANCY SURGERY     RHINOPLASTY  1990   SALPINGECTOMY     TONSILLECTOMY  1969   WRIST SURGERY  1983    Family History  Problem Relation Age of Onset   Alzheimer's disease Mother    Parkinsonism Father    Heart  disease Brother    Heart attack Brother    Depression Sister    Osteoporosis Sister    Breast cancer Neg Hx     Social History   Tobacco Use   Smoking status: Never   Smokeless tobacco: Never  Substance Use Topics   Alcohol use: Yes    Alcohol/week: 0.0 standard drinks    Comment: Consumes alcohol on Saturday     Current Outpatient Medications:    albuterol (VENTOLIN HFA) 108 (90 Base) MCG/ACT inhaler, Inhale 2 puffs into the lungs every 6 (six) hours as needed for wheezing or shortness of breath., Disp: 8 g, Rfl: 0   escitalopram (LEXAPRO) 10 MG tablet, Take 1 tablet (10 mg total) by mouth in the morning., Disp: 30 tablet, Rfl: 0    ezetimibe (ZETIA) 10 MG tablet, Take 1 tablet (10 mg total) by mouth daily., Disp: 90 tablet, Rfl: 1   gabapentin (NEURONTIN) 300 MG capsule, Take 1 capsule (300 mg total) by mouth at bedtime., Disp: 90 capsule, Rfl: 1   levocetirizine (XYZAL) 5 MG tablet, Take 2.5 mg by mouth every evening. , Disp: , Rfl:    meloxicam (MOBIC) 15 MG tablet, Take 1 tablet (15 mg total) by mouth daily., Disp: 30 tablet, Rfl: 1   Multiple Vitamin (MULTIVITAMIN) tablet, Take 1 tablet by mouth daily., Disp: , Rfl:    omeprazole (PRILOSEC) 40 MG capsule, Take 1 capsule (40 mg total) by mouth daily., Disp: 90 capsule, Rfl: 1   traZODone (DESYREL) 50 MG tablet, Take 0.5-1 tablets (25-50 mg total) by mouth at bedtime as needed for sleep., Disp: 30 tablet, Rfl: 0   valACYclovir (VALTREX) 1000 MG tablet, Take 1 tablet by mouth twice daily, Disp: 30 tablet, Rfl: 0   Vitamin D, Ergocalciferol, (DRISDOL) 1.25 MG (50000 UNIT) CAPS capsule, Take 1 capsule by mouth once a week, Disp: 12 capsule, Rfl: 0  Allergies  Allergen Reactions   Crestor [Rosuvastatin]     Cause muscle cramps, dizziness, headache and overall fatigue.   Atorvastatin     Muscle ache   Pravastatin Itching    dizziness   Venlafaxine Other (See Comments)    dizziness   Hydrocodone Nausea Only    I personally reviewed active problem list, medication list, allergies, family history, social history, health maintenance with the patient/caregiver today.   ROS  Ten systems reviewed and is negative except as mentioned in HPI   Objective  Vitals:   08/10/21 0951  BP: 122/66  Pulse: 72  Resp: 16  Temp: 98.7 F (37.1 C)  SpO2: 99%  Weight: 166 lb (75.3 kg)  Height: 5\' 2"  (1.575 m)    Body mass index is 30.36 kg/m.  Physical Exam  Constitutional: Patient appears well-developed and well-nourished. Obese  No distress.  HEENT: head atraumatic, normocephalic, pupils equal and reactive to light, neck supple Cardiovascular: Normal rate, regular  rhythm and normal heart sounds.  No murmur heard. No BLE edema. Pulmonary/Chest: Effort normal and breath sounds normal. No respiratory distress. Abdominal: Soft.  There is no tenderness. Psychiatric: Patient has a normal mood and affect. behavior is normal. Judgment and thought content normal.   PHQ2/9: Depression screen [facility] 2/9 08/10/2021 07/14/2021 06/09/2021 02/09/2021 08/08/2020  Decreased Interest 0 0 0 0 0  Down, Depressed, Hopeless 0 1 3 0 0  PHQ - 2 Score 0 1 3 0 0  Altered sleeping 1 1 3  - -  Tired, decreased energy 1 0 0 - -  Change in appetite 0  1 0 - -  Feeling bad or failure about yourself  0 0 0 - -  Trouble concentrating 0 0 0 - -  Moving slowly or fidgety/restless 0 0 0 - -  Suicidal thoughts 0 0 0 - -  PHQ-9 Score 2 3 6  - -  Difficult doing work/chores Not difficult at all - - - -  Some recent data might be hidden    phq 9 is negative   Fall Risk: Fall Risk  08/10/2021 07/14/2021 06/09/2021 02/09/2021 08/08/2020  Falls in the past year? 0 0 0 1 1  Number falls in past yr: 0 0 0 0 0  Comment - - - - -  Injury with Fall? 0 0 0 1 1  Comment - - - - -  Risk for fall due to : No Fall Risks No Fall Risks - - -  Follow up Falls prevention discussed Falls prevention discussed - - -      Functional Status Survey: Is the patient deaf or have difficulty hearing?: No Does the patient have difficulty seeing, even when wearing glasses/contacts?: No Does the patient have difficulty concentrating, remembering, or making decisions?: No Does the patient have difficulty walking or climbing stairs?: No Does the patient have difficulty dressing or bathing?: No Does the patient have difficulty doing errands alone such as visiting a doctor's office or shopping?: No    Assessment & Plan  1. Dysthymia  - escitalopram (LEXAPRO) 10 MG tablet; Take 1 tablet (10 mg total) by mouth in the morning.  Dispense: 90 tablet; Refill: 1  2. Anxiety  - escitalopram (LEXAPRO) 10 MG tablet; Take  1 tablet (10 mg total) by mouth in the morning.  Dispense: 90 tablet; Refill: 1  3. Insomnia, unspecified type  - traZODone (DESYREL) 50 MG tablet; Take 0.5-1 tablets (25-50 mg total) by mouth at bedtime as needed for sleep.  Dispense: 90 tablet; Refill: 1   1. Dysthymia  - escitalopram (LEXAPRO) 10 MG tablet; Take 1 tablet (10 mg total) by mouth in the morning.  Dispense: 90 tablet; Refill: 1  2. Anxiety  - escitalopram (LEXAPRO) 10 MG tablet; Take 1 tablet (10 mg total) by mouth in the morning.  Dispense: 90 tablet; Refill: 1  3. Insomnia, unspecified type  - traZODone (DESYREL) 50 MG tablet; Take 0.5-1 tablets (25-50 mg total) by mouth at bedtime as needed for sleep.  Dispense: 90 tablet; Refill: 1  4. B12 deficiency  - CBC with Differential/Platelet  5. Gastroesophageal reflux disease without esophagitis   6. Vitamin D deficiency  - Vitamin D, Ergocalciferol, (DRISDOL) 1.25 MG (50000 UNIT) CAPS capsule; Take 1 capsule (50,000 Units total) by mouth once a week.  Dispense: 12 capsule; Refill: 1  7. Dyslipidemia  - Lipid panel - ezetimibe (ZETIA) 10 MG tablet; Take 1 tablet (10 mg total) by mouth daily.  Dispense: 90 tablet; Refill: 1  8. Long-term use of high-risk medication  - COMPLETE METABOLIC PANEL WITH GFR  9. Hyperglycemia  - Hemoglobin A1c  10. Gastroesophageal reflux disease with esophagitis without hemorrhage  - omeprazole (PRILOSEC) 40 MG capsule; Take 1 capsule (40 mg total) by mouth daily.  Dispense: 90 capsule; Refill: 1

## 2021-08-10 ENCOUNTER — Ambulatory Visit: Payer: Federal, State, Local not specified - PPO | Admitting: Family Medicine

## 2021-08-10 ENCOUNTER — Encounter: Payer: Self-pay | Admitting: Family Medicine

## 2021-08-10 VITALS — BP 122/66 | HR 72 | Temp 98.7°F | Resp 16 | Ht 62.0 in | Wt 166.0 lb

## 2021-08-10 DIAGNOSIS — R739 Hyperglycemia, unspecified: Secondary | ICD-10-CM | POA: Diagnosis not present

## 2021-08-10 DIAGNOSIS — F341 Dysthymic disorder: Secondary | ICD-10-CM | POA: Diagnosis not present

## 2021-08-10 DIAGNOSIS — G4700 Insomnia, unspecified: Secondary | ICD-10-CM

## 2021-08-10 DIAGNOSIS — K2100 Gastro-esophageal reflux disease with esophagitis, without bleeding: Secondary | ICD-10-CM

## 2021-08-10 DIAGNOSIS — E785 Hyperlipidemia, unspecified: Secondary | ICD-10-CM

## 2021-08-10 DIAGNOSIS — Z79899 Other long term (current) drug therapy: Secondary | ICD-10-CM | POA: Diagnosis not present

## 2021-08-10 DIAGNOSIS — F419 Anxiety disorder, unspecified: Secondary | ICD-10-CM

## 2021-08-10 DIAGNOSIS — K219 Gastro-esophageal reflux disease without esophagitis: Secondary | ICD-10-CM

## 2021-08-10 DIAGNOSIS — E559 Vitamin D deficiency, unspecified: Secondary | ICD-10-CM

## 2021-08-10 DIAGNOSIS — E538 Deficiency of other specified B group vitamins: Secondary | ICD-10-CM | POA: Diagnosis not present

## 2021-08-10 MED ORDER — EZETIMIBE 10 MG PO TABS: 10.0000 mg | ORAL_TABLET | Freq: Every day | ORAL | 1 refills | Status: DC

## 2021-08-10 MED ORDER — ESCITALOPRAM OXALATE 10 MG PO TABS: 10.0000 mg | ORAL_TABLET | Freq: Every morning | ORAL | 1 refills | Status: DC

## 2021-08-10 MED ORDER — OMEPRAZOLE 40 MG PO CPDR: 40.0000 mg | DELAYED_RELEASE_CAPSULE | Freq: Every day | ORAL | 1 refills | Status: DC

## 2021-08-10 MED ORDER — TRAZODONE HCL 50 MG PO TABS: 25.0000 mg | ORAL_TABLET | Freq: Every evening | ORAL | 1 refills | Status: DC | PRN

## 2021-08-10 MED ORDER — VITAMIN D (ERGOCALCIFEROL) 1.25 MG (50000 UNIT) PO CAPS: 50000.0000 [IU] | ORAL_CAPSULE | ORAL | 1 refills | Status: DC

## 2021-08-11 LAB — CBC WITH DIFFERENTIAL/PLATELET
Absolute Monocytes: 409 cells/uL (ref 200–950)
Basophils Absolute: 47 cells/uL (ref 0–200)
Basophils Relative: 0.7 %
Eosinophils Absolute: 281 cells/uL (ref 15–500)
Eosinophils Relative: 4.2 %
HCT: 40.2 % (ref 35.0–45.0)
Hemoglobin: 13.4 g/dL (ref 11.7–15.5)
Lymphs Abs: 1541 cells/uL (ref 850–3900)
MCH: 29.3 pg (ref 27.0–33.0)
MCHC: 33.3 g/dL (ref 32.0–36.0)
MCV: 88 fL (ref 80.0–100.0)
MPV: 10.7 fL (ref 7.5–12.5)
Monocytes Relative: 6.1 %
Neutro Abs: 4422 cells/uL (ref 1500–7800)
Neutrophils Relative %: 66 %
Platelets: 294 10*3/uL (ref 140–400)
RBC: 4.57 10*6/uL (ref 3.80–5.10)
RDW: 12.3 % (ref 11.0–15.0)
Total Lymphocyte: 23 %
WBC: 6.7 10*3/uL (ref 3.8–10.8)

## 2021-08-11 LAB — COMPLETE METABOLIC PANEL WITH GFR
AG Ratio: 1.9 (calc) (ref 1.0–2.5)
ALT: 16 U/L (ref 6–29)
AST: 17 U/L (ref 10–35)
Albumin: 4.3 g/dL (ref 3.6–5.1)
Alkaline phosphatase (APISO): 59 U/L (ref 37–153)
BUN: 15 mg/dL (ref 7–25)
CO2: 27 mmol/L (ref 20–32)
Calcium: 9.3 mg/dL (ref 8.6–10.4)
Chloride: 104 mmol/L (ref 98–110)
Creat: 0.76 mg/dL (ref 0.50–1.05)
Globulin: 2.3 g/dL (calc) (ref 1.9–3.7)
Glucose, Bld: 100 mg/dL — ABNORMAL HIGH (ref 65–99)
Potassium: 4.6 mmol/L (ref 3.5–5.3)
Sodium: 140 mmol/L (ref 135–146)
Total Bilirubin: 0.4 mg/dL (ref 0.2–1.2)
Total Protein: 6.6 g/dL (ref 6.1–8.1)
eGFR: 89 mL/min/{1.73_m2} (ref >=60)

## 2021-08-11 LAB — HEMOGLOBIN A1C
Hgb A1c MFr Bld: 5.8 % of total Hgb — ABNORMAL HIGH (ref <5.7)
Mean Plasma Glucose: 120 mg/dL
eAG (mmol/L): 6.6 mmol/L

## 2021-08-11 LAB — LIPID PANEL
Cholesterol: 242 mg/dL — ABNORMAL HIGH (ref <200)
HDL: 47 mg/dL — ABNORMAL LOW (ref >=50)
LDL Cholesterol (Calc): 161 mg/dL (calc) — ABNORMAL HIGH
Non-HDL Cholesterol (Calc): 195 mg/dL (calc) — ABNORMAL HIGH (ref <130)
Total CHOL/HDL Ratio: 5.1 (calc) — ABNORMAL HIGH (ref <5.0)
Triglycerides: 186 mg/dL — ABNORMAL HIGH (ref <150)

## 2021-08-12 ENCOUNTER — Encounter: Payer: Self-pay | Admitting: Family Medicine

## 2021-08-20 ENCOUNTER — Telehealth: Payer: Self-pay

## 2021-08-20 NOTE — Telephone Encounter (Signed)
Spoke with patient and scheduled her to see Dr. Ancil Boozer on 08/26/21 @ 9:40 AM.

## 2021-08-20 NOTE — Telephone Encounter (Signed)
Please advise if pt can be seen sooner. No openings with Dr Ancil Boozer until February.    Copied from Sara Randolph [phone]. Topic: Appointment Scheduling - Scheduling Inquiry for Clinic >> Aug 20, 2021  9:12 AM Sara Randolph wrote: Reason for CRM: pt called in for assistance with scheduling. Pt says that she and provider has been discussing a new medication. Advised pt of provider's next opening (February) pt would like to know if provide could see her sooner so that she can start the medication sooner?    Please assist,

## 2021-08-25 NOTE — Progress Notes (Signed)
Name: Sara Randolph   MRN: [id number]    DOB: Jul 27, 1961   Date:08/26/2021       Progress Note  Subjective  Chief Complaint  Discuss Weight Loss  HPI  Pre-diabetes: she is on a low sugar diet, denies polyphagia, polydipsia or polyuria, seen by Dr. Gabriel Carina, on life style modification only. she was on [place] from April 1st, 2021 to Dec 2021, weight was down to 136 lbs but gained a little weight over the holidays . She came in today to discuss starting on Ozempic. Discussed possible side effects. She is willing to try it. She already check for coverage with her insurance . She has GERD and explained this medication delays gastric emptying and needs to eat smaller amounts and also eat slower.   She denies previous history of pancreatitis No family history of thyroid cancer   Headaches: left frontal and going on for the past week, she stopped taking Lexapro and explained unlikely the cause of her headache, she states she has a long history of recurrent headaches, pain is described as dull and radiates to right eye , no photophobia or phonophobia. She only took meloxicam once. Advised to try Excedrin migraine and to rest, resume lexapro  Patient Active Problem List   Diagnosis Date Noted   Impingement syndrome of right shoulder region 11/06/2020   Statin myopathy 08/08/2020   Hypercholesteremia 08/08/2020   B12 deficiency 08/08/2020   Hyperglycemia 08/08/2020   Lumbar back pain with radiculopathy affecting right lower extremity 08/31/2018   Vitamin D deficiency 02/24/2017   BPPV (benign paroxysmal positional vertigo), unspecified laterality 08/25/2016   Peripheral vertigo 01/13/2015   Dyslipidemia 01/13/2015   Cold sore 01/13/2015   Gastric reflux 01/13/2015   Perennial allergic rhinitis 01/13/2015   Menopausal symptom 01/13/2015   History of concussion 01/18/2013    Past Surgical History:  Procedure Laterality Date   ABDOMINAL HYSTERECTOMY  1988   BREAST BIOPSY Right  07/04/2020   stereo bx, x-clip, FATTY BREAST TISSUE WITH LIMITED   CHOLECYSTECTOMY     DILATION AND CURETTAGE OF UTERUS     ECTOPIC PREGNANCY SURGERY     RHINOPLASTY  1990   SALPINGECTOMY     TONSILLECTOMY  1969   WRIST SURGERY  1983    Family History  Problem Relation Age of Onset   Alzheimer's disease Mother    Parkinsonism Father    Heart disease Brother    Heart attack Brother    Depression Sister    Osteoporosis Sister    Breast cancer Neg Hx     Social History   Tobacco Use   Smoking status: Never   Smokeless tobacco: Never  Substance Use Topics   Alcohol use: Yes    Alcohol/week: 0.0 standard drinks    Comment: Consumes alcohol on Saturday     Current Outpatient Medications:    albuterol (VENTOLIN HFA) 108 (90 Base) MCG/ACT inhaler, Inhale 2 puffs into the lungs every 6 (six) hours as needed for wheezing or shortness of breath., Disp: 8 g, Rfl: 0   escitalopram (LEXAPRO) 10 MG tablet, Take 1 tablet (10 mg total) by mouth in the morning., Disp: 90 tablet, Rfl: 1   ezetimibe (ZETIA) 10 MG tablet, Take 1 tablet (10 mg total) by mouth daily., Disp: 90 tablet, Rfl: 1   gabapentin (NEURONTIN) 300 MG capsule, Take 1 capsule (300 mg total) by mouth at bedtime., Disp: 90 capsule, Rfl: 1   levocetirizine (XYZAL) 5 MG tablet, Take 2.5 mg by mouth  every evening. , Disp: , Rfl:    meloxicam (MOBIC) 15 MG tablet, Take 1 tablet (15 mg total) by mouth daily., Disp: 30 tablet, Rfl: 1   Multiple Vitamin (MULTIVITAMIN) tablet, Take 1 tablet by mouth daily., Disp: , Rfl:    omeprazole (PRILOSEC) 40 MG capsule, Take 1 capsule (40 mg total) by mouth daily., Disp: 90 capsule, Rfl: 1   traZODone (DESYREL) 50 MG tablet, Take 0.5-1 tablets (25-50 mg total) by mouth at bedtime as needed for sleep., Disp: 90 tablet, Rfl: 1   valACYclovir (VALTREX) 1000 MG tablet, Take 1 tablet by mouth twice daily, Disp: 30 tablet, Rfl: 0   Vitamin D, Ergocalciferol, (DRISDOL) 1.25 MG (50000 UNIT) CAPS  capsule, Take 1 capsule (50,000 Units total) by mouth once a week., Disp: 12 capsule, Rfl: 1  Allergies  Allergen Reactions   Crestor [Rosuvastatin]     Cause muscle cramps, dizziness, headache and overall fatigue.   Atorvastatin     Muscle ache   Pravastatin Itching    dizziness   Venlafaxine Other (See Comments)    dizziness   Hydrocodone Nausea Only    I personally reviewed active problem list, medication list, allergies, family history, social history, health maintenance with the patient/caregiver today.   ROS  Ten systems reviewed and is negative except as mentioned in HPI   Objective  Vitals:   08/26/21 0932  BP: 128/66  Pulse: 77  Resp: 16  SpO2: 98%  Weight: 166 lb (75.3 kg)  Height: _0  (1.575 m)    Body mass index is 30.36 kg/m.  Physical Exam  Constitutional: Patient appears well-developed and well-nourished. Obese  No distress.  HEENT: head atraumatic, normocephalic, pupils equal and reactive to light, neck supple Cardiovascular: Normal rate, regular rhythm and normal heart sounds.  No murmur heard. No BLE edema. Pulmonary/Chest: Effort normal and breath sounds normal. No respiratory distress. Abdominal: Soft.  There is no tenderness. Psychiatric: Patient has a normal mood and affect. behavior is normal. Judgment and thought content normal.   Recent Results (from the past 2160 hour(s))  Lipid panel     Status: Abnormal   Collection Time: 08/10/21 10:21 AM  Result Value Ref Range   Cholesterol 242 (H) <200 mg/dL   HDL 47 (L) > OR = 50 mg/dL   Triglycerides 186 (H) <150 mg/dL   LDL Cholesterol (Calc) 161 (H) mg/dL (calc)    Comment: Reference range: <100 . Desirable range <100 mg/dL for primary prevention;   <70 mg/dL for patients with CHD or diabetic patients  with > or = 2 CHD risk factors. Marland Kitchen LDL-C is now calculated using the Martin-Hopkins  calculation, which is a validated novel method providing  better accuracy than the Friedewald  equation in the  estimation of LDL-C.  Cresenciano Genre et al. Annamaria Helling. 8527;782(42): 2061-2068  (http://education.[facility].com/faq/FAQ164)    Total CHOL/HDL Ratio 5.1 (H) <5.0 (calc)   Non-HDL Cholesterol (Calc) 195 (H) <130 mg/dL (calc)    Comment: For patients with diabetes plus 1 major ASCVD risk  factor, treating to a non-HDL-C goal of <100 mg/dL  (LDL-C of <70 mg/dL) is considered a therapeutic  option.   CBC with Differential/Platelet     Status: None   Collection Time: 08/10/21 10:21 AM  Result Value Ref Range   WBC 6.7 3.8 - 10.8 Thousand/uL   RBC 4.57 3.80 - 5.10 Million/uL   Hemoglobin 13.4 11.7 - 15.5 g/dL   HCT 40.2 35.0 - 45.0 %   MCV 88.0 80.0 -  100.0 fL   MCH 29.3 27.0 - 33.0 pg   MCHC 33.3 32.0 - 36.0 g/dL   RDW 12.3 11.0 - 15.0 %   Platelets 294 140 - 400 Thousand/uL   MPV 10.7 7.5 - 12.5 fL   Neutro Abs 4,422 1,500 - 7,800 cells/uL   Lymphs Abs 1,541 850 - 3,900 cells/uL   Absolute Monocytes 409 200 - 950 cells/uL   Eosinophils Absolute 281 15 - 500 cells/uL   Basophils Absolute 47 0 - 200 cells/uL   Neutrophils Relative % 66 %   Total Lymphocyte 23.0 %   Monocytes Relative 6.1 %   Eosinophils Relative 4.2 %   Basophils Relative 0.7 %  COMPLETE METABOLIC PANEL WITH GFR     Status: Abnormal   Collection Time: 08/10/21 10:21 AM  Result Value Ref Range   Glucose, Bld 100 (H) 65 - 99 mg/dL    Comment: .            Fasting reference interval . For someone without known diabetes, a glucose value between 100 and 125 mg/dL is consistent with prediabetes and should be confirmed with a follow-up test. .    BUN 15 7 - 25 mg/dL   Creat 0.76 0.50 - 1.05 mg/dL   eGFR 89 > OR = 60 mL/min/1.79m    Comment: The eGFR is based on the CKD-EPI 2021 equation. To calculate  the new eGFR from a previous Creatinine or Cystatin C result, go to https://www.kidney.org/professionals/ kdoqi/gfr%5Fcalculator    BUN/Creatinine Ratio NOT APPLICABLE 6 - 22 (calc)   Sodium 140  135 - 146 mmol/L   Potassium 4.6 3.5 - 5.3 mmol/L   Chloride 104 98 - 110 mmol/L   CO2 27 20 - 32 mmol/L   Calcium 9.3 8.6 - 10.4 mg/dL   Total Protein 6.6 6.1 - 8.1 g/dL   Albumin 4.3 3.6 - 5.1 g/dL   Globulin 2.3 1.9 - 3.7 g/dL (calc)   AG Ratio 1.9 1.0 - 2.5 (calc)   Total Bilirubin 0.4 0.2 - 1.2 mg/dL   Alkaline phosphatase (APISO) 59 37 - 153 U/L   AST 17 10 - 35 U/L   ALT 16 6 - 29 U/L  Hemoglobin A1c     Status: Abnormal   Collection Time: 08/10/21 10:21 AM  Result Value Ref Range   Hgb A1c MFr Bld 5.8 (H) <5.7 % of total Hgb    Comment: For someone without known diabetes, a hemoglobin  A1c value between 5.7% and 6.4% is consistent with prediabetes and should be confirmed with a  follow-up test. . For someone with known diabetes, a value <7% indicates that their diabetes is well controlled. A1c targets should be individualized based on duration of diabetes, age, comorbid conditions, and other considerations. . This assay result is consistent with an increased risk of diabetes. . Currently, no consensus exists regarding use of hemoglobin A1c for diagnosis of diabetes for children. .    Mean Plasma Glucose 120 mg/dL   eAG (mmol/L) 6.6 mmol/L     PHQ2/9: [facility] screen [facility] 08/26/2021 08/10/2021 07/14/2021 06/09/2021 02/09/2021  Decreased Interest 0 0 0 0 0  Down, Depressed, Hopeless 0 0 1 3 0  PHQ - 2 Score 0 0 1 3 0  Altered sleeping 0 _0 -  Tired, decreased energy 0 1 0 0 -  Change in appetite 0 0 1 0 -  Feeling bad or failure about yourself  0 0 0 0 -  Trouble concentrating  0 0 0 0 -  Moving slowly or fidgety/restless 0 0 0 0 -  Suicidal thoughts 0 0 0 0 -  PHQ-9 Score 0 _0 -  Difficult doing work/chores - Not difficult at all - - -  Some recent data might be hidden    phq 9 is negative   Fall Risk: Fall Risk  08/26/2021 08/10/2021 07/14/2021 06/09/2021 02/09/2021  Falls in the past year? 0 0 0 0 1  Number falls in past yr: 0 0 0 0 0  Comment -  - - - -  Injury with Fall? 0 0 0 0 1  Comment - - - - -  Risk for fall due to : No Fall Risks No Fall Risks No Fall Risks - -  Follow up Falls prevention discussed Falls prevention discussed Falls prevention discussed - -      Functional Status Survey: Is the patient deaf or have difficulty hearing?: No Does the patient have difficulty seeing, even when wearing glasses/contacts?: No Does the patient have difficulty concentrating, remembering, or making decisions?: No Does the patient have difficulty walking or climbing stairs?: No Does the patient have difficulty dressing or bathing?: No Does the patient have difficulty doing errands alone such as visiting a doctor's office or shopping?: No    Assessment & Plan  1. Pre-diabetes  - Semaglutide,0.25 or 0.5MG/DOS, (OZEMPIC, 0.25 OR 0.5 MG/DOSE,) 2 MG/1.5ML SOPN; Inject 0.5 mg into the skin once a week.  Dispense: 4.5 mL; Refill: 0  2. Hyperglycemia  - Semaglutide,0.25 or 0.5MG/DOS, (OZEMPIC, 0.25 OR 0.5 MG/DOSE,) 2 MG/1.5ML SOPN; Inject 0.5 mg into the skin once a week.  Dispense: 4.5 mL; Refill: 0  3. Obesity (BMI 30.0-34.9)  Discussed with the patient the risk posed by an increased BMI. Discussed importance of portion control, calorie counting and at least 150 minutes of physical activity weekly. Avoid sweet beverages and drink more water. Eat at least 6 servings of fruit and vegetables daily    4. Frontal headache  Advised otc medication, rest, fluids and try to sleep more

## 2021-08-26 ENCOUNTER — Encounter: Payer: Self-pay | Admitting: Family Medicine

## 2021-08-26 ENCOUNTER — Ambulatory Visit: Payer: Federal, State, Local not specified - PPO | Admitting: Family Medicine

## 2021-08-26 VITALS — BP 128/66 | HR 77 | Resp 16 | Ht 62.0 in | Wt 166.0 lb

## 2021-08-26 DIAGNOSIS — R519 Headache, unspecified: Secondary | ICD-10-CM

## 2021-08-26 DIAGNOSIS — E669 Obesity, unspecified: Secondary | ICD-10-CM

## 2021-08-26 DIAGNOSIS — R7303 Prediabetes: Secondary | ICD-10-CM | POA: Diagnosis not present

## 2021-08-26 DIAGNOSIS — R739 Hyperglycemia, unspecified: Secondary | ICD-10-CM | POA: Diagnosis not present

## 2021-08-26 MED ORDER — OZEMPIC (0.25 OR 0.5 MG/DOSE) 2 MG/1.5ML ~~LOC~~ SOPN: 0.5000 mg | PEN_INJECTOR | SUBCUTANEOUS | 0 refills | Status: DC

## 2021-09-22 ENCOUNTER — Encounter: Payer: Self-pay | Admitting: Family Medicine

## 2021-09-25 ENCOUNTER — Encounter: Payer: Self-pay | Admitting: Family Medicine

## 2021-09-28 ENCOUNTER — Other Ambulatory Visit: Payer: Self-pay | Admitting: Family Medicine

## 2021-09-28 ENCOUNTER — Other Ambulatory Visit: Payer: Self-pay

## 2021-09-28 DIAGNOSIS — R7303 Prediabetes: Secondary | ICD-10-CM

## 2021-09-28 DIAGNOSIS — R739 Hyperglycemia, unspecified: Secondary | ICD-10-CM

## 2021-09-28 MED ORDER — OZEMPIC (0.25 OR 0.5 MG/DOSE) 2 MG/1.5ML ~~LOC~~ SOPN: 0.5000 mg | PEN_INJECTOR | SUBCUTANEOUS | 0 refills | Status: DC

## 2021-10-13 ENCOUNTER — Ambulatory Visit: Payer: Federal, State, Local not specified - PPO | Admitting: Podiatry

## 2021-10-13 ENCOUNTER — Encounter: Payer: Self-pay | Admitting: Podiatry

## 2021-10-13 ENCOUNTER — Other Ambulatory Visit: Payer: Self-pay

## 2021-10-13 DIAGNOSIS — M21861 Other specified acquired deformities of right lower leg: Secondary | ICD-10-CM | POA: Diagnosis not present

## 2021-10-13 DIAGNOSIS — M722 Plantar fascial fibromatosis: Secondary | ICD-10-CM

## 2021-10-13 NOTE — Progress Notes (Signed)
?Subjective:  ?Patient ID: Sara Randolph, female    DOB: 10-Apr-1961,  MRN: [id number] ? ?Chief Complaint  ?Patient presents with  ? Plantar Fasciitis  ? ? ?61 y.o. female presents with the above complaint.  Patient presents with right heel pain that has not recurred.  She states is painful to touch painful to walk on.  She has had multiple injections done by Dr. Amalia Hailey especially on the left side.  Now the right side is acting up.  She wanted get it evaluated she has not seen anyone else prior to seeing me.  She states this flareup is very painful to walk on.  She would like an injection.  She has not worn a brace. ? ? ?Review of Systems: Negative except as noted in the HPI. Denies N/V/F/Ch. ? ?Past Medical History:  ?Diagnosis Date  ? Allergy   ? Anxiety   ? Asthma   ? Concussion with no loss of consciousness 02/28/2013  ? GERD (gastroesophageal reflux disease)   ? Hemorrhoids   ? High cholesterol   ? Lung nodule   ? Seborrheic keratosis   ? ? ?Current Outpatient Medications:  ?  albuterol (VENTOLIN HFA) 108 (90 Base) MCG/ACT inhaler, Inhale 2 puffs into the lungs every 6 (six) hours as needed for wheezing or shortness of breath., Disp: 8 g, Rfl: 0 ?  escitalopram (LEXAPRO) 10 MG tablet, Take 1 tablet (10 mg total) by mouth in the morning., Disp: 90 tablet, Rfl: 1 ?  ezetimibe (ZETIA) 10 MG tablet, Take 1 tablet (10 mg total) by mouth daily., Disp: 90 tablet, Rfl: 1 ?  gabapentin (NEURONTIN) 300 MG capsule, Take 1 capsule (300 mg total) by mouth at bedtime., Disp: 90 capsule, Rfl: 1 ?  levocetirizine (XYZAL) 5 MG tablet, Take 2.5 mg by mouth every evening. , Disp: , Rfl:  ?  meloxicam (MOBIC) 15 MG tablet, Take 1 tablet (15 mg total) by mouth daily., Disp: 30 tablet, Rfl: 1 ?  Multiple Vitamin (MULTIVITAMIN) tablet, Take 1 tablet by mouth daily., Disp: , Rfl:  ?  omeprazole (PRILOSEC) 40 MG capsule, Take 1 capsule (40 mg total) by mouth daily., Disp: 90 capsule, Rfl: 1 ?  Semaglutide,0.25 or 0.'5MG'$ /DOS,  (OZEMPIC, 0.25 OR 0.5 MG/DOSE,) 2 MG/1.5ML SOPN, Inject 0.5 mg into the skin once a week., Disp: 1.5 mL, Rfl: 0 ?  traZODone (DESYREL) 50 MG tablet, Take 0.5-1 tablets (25-50 mg total) by mouth at bedtime as needed for sleep., Disp: 90 tablet, Rfl: 1 ?  valACYclovir (VALTREX) 1000 MG tablet, Take 1 tablet by mouth twice daily, Disp: 30 tablet, Rfl: 0 ?  Vitamin D, Ergocalciferol, (DRISDOL) 1.25 MG (50000 UNIT) CAPS capsule, Take 1 capsule (50,000 Units total) by mouth once a week., Disp: 12 capsule, Rfl: 1 ? ?Social History  ? ?Tobacco Use  ?Smoking Status Never  ?Smokeless Tobacco Never  ? ? ?Allergies  ?Allergen Reactions  ? Crestor [Rosuvastatin]   ?  Cause muscle cramps, dizziness, headache and overall fatigue.  ? Atorvastatin   ?  Muscle ache  ? Pravastatin Itching  ?  dizziness  ? Venlafaxine Other (See Comments)  ?  dizziness  ? Hydrocodone Nausea Only  ? ?Objective:  ?There were no vitals filed for this visit. ?There is no height or weight on file to calculate BMI. ?Constitutional Well developed. ?Well nourished.  ?Vascular Dorsalis pedis pulses palpable bilaterally. ?Posterior tibial pulses palpable bilaterally. ?Capillary refill normal to all digits.  ?No cyanosis or clubbing noted. ?Pedal hair growth  normal.  ?Neurologic Normal speech. ?Oriented to person, place, and time. ?Epicritic sensation to light touch grossly present bilaterally.  ?Dermatologic Nails well groomed and normal in appearance. ?No open wounds. ?No skin lesions.  ?Orthopedic: Normal joint ROM without pain or crepitus bilaterally. ?No visible deformities. ?Tender to palpation at the calcaneal tuber right. ?No pain with calcaneal squeeze right. ?Ankle ROM diminished range of motion right. ?Silfverskiold Test: positive right.  ? ?Radiographs: None ? ?Assessment:  ? ?1. Gastrocnemius equinus, right   ?2. Plantar fasciitis, right   ? ?Plan:  ?Patient was evaluated and treated and all questions answered. ? ?Plantar Fasciitis, right with  underlying gastrocnemius equinus ?- XR reviewed as above.  ?- Educated on icing and stretching. Instructions given.  ?- Injection delivered to the plantar fascia as below. ?- DME: Plantar fascial brace dispensed to support the medial longitudinal arch of the foot and offload pressure from the heel and prevent arch collapse during weightbearing ?- Pharmacologic management: None ?-I discussed the importance of stretching and doing stretching of the Achilles tendon.  She states understanding. ? ?Procedure: Injection Tendon/Ligament ?Location: Right plantar fascia at the glabrous junction; medial approach. ?Skin Prep: alcohol ?Injectate: 0.5 cc 0.5% marcaine plain, 0.5 cc of 1% Lidocaine, 0.5 cc kenalog 10. ?Disposition: Patient tolerated procedure well. Injection site dressed with a band-aid. ? ?No follow-ups on file. ?

## 2021-10-23 ENCOUNTER — Other Ambulatory Visit: Payer: Self-pay | Admitting: Family Medicine

## 2021-10-23 DIAGNOSIS — B001 Herpesviral vesicular dermatitis: Secondary | ICD-10-CM

## 2021-11-15 ENCOUNTER — Other Ambulatory Visit: Payer: Self-pay | Admitting: Family Medicine

## 2021-11-15 DIAGNOSIS — R739 Hyperglycemia, unspecified: Secondary | ICD-10-CM

## 2021-11-15 DIAGNOSIS — R7303 Prediabetes: Secondary | ICD-10-CM

## 2021-11-17 ENCOUNTER — Ambulatory Visit: Payer: Federal, State, Local not specified - PPO | Admitting: Podiatry

## 2021-11-17 DIAGNOSIS — M722 Plantar fascial fibromatosis: Secondary | ICD-10-CM | POA: Diagnosis not present

## 2021-11-18 ENCOUNTER — Other Ambulatory Visit: Payer: Self-pay | Admitting: Family Medicine

## 2021-11-23 NOTE — Progress Notes (Signed)
Name: Sara Randolph   MRN: [id number]    DOB: 04/14/1961   Date:11/24/2021 ? ?     Progress Note ? ?Subjective ? ?Chief Complaint ? ?Follow Up ? ?HPI ? ?Dysthymia/Insomnia: seen in Dec for marital problems, she was  feeling anxious and also having insomnia, we started her on Lexapro and also Trazodone. She states sleeping is better, able to fall asleep and is sleeping through the night.  She states medication has mellowed her out, not reacting as quickly . She states she spoke to him about wanting to spend more time with him. Initially he was defensive but afterwards they spoke and they are both trying to stop the bickering cycle. They are also spending more time together, working in the yard together adding a date night once a week.  ? ?B12 and Vitamin D deficiency: last levels were normal, continue supplements  ?  ?Hyperlipidemia:she has noticed muscle aches and fatigue with statin therapy, she is now on Zetia and last LDL was up to 161 from 125 while taking statin therapy. She is now losing weight and eating healthier  ?  ?Pre-diabetes: she is on a low sugar diet, denies polyphagia, polydipsia or polyuria. She was on [place] from April 1st, 2021 to Dec 2021, weight was down to 136 lbs but gained a little weight over the holidays . She started on Ozempic January 2023. She states initially indigestion, followed by constipation and also nausea. She has lost 17 lbs. She states side effects have subsided over the past month , she is currently giving herself 1 mg , she states got confused, therefore we will adjust dose to 1 mg weekly . She still craves sweets but trying to avoid it  ?  ?GERD: she states her GERD symptoms are stable on Omeprazole 40 mg daily , Ozempic made her feel bloated and nauseated but improved now  ?  ?Low back pain with radiculitis: going on for over one year, she has neurosurgeon Dr. Delice Lesch , she had a normal MRI in 2020 - unable to see the results. She states no recent flares, she takes  Gabapentin prn when she notices right lower extremity paresthesia. She also takes Meloxicam prn . Currently not taking medication  ?  ?Lung nodule: seen by Dr. Raul Del in the past and last CT was stable and released back in 2015 . Unchanged  ? ?Perennial AR: she states currently on Xyzal and doing well, usually she takes loratadine also  ? ?Patient Active Problem List  ? Diagnosis Date Noted  ? Impingement syndrome of right shoulder region 11/06/2020  ? Statin myopathy 08/08/2020  ? Hypercholesteremia 08/08/2020  ? B12 deficiency 08/08/2020  ? Hyperglycemia 08/08/2020  ? Lumbar back pain with radiculopathy affecting right lower extremity 08/31/2018  ? Vitamin D deficiency 02/24/2017  ? BPPV (benign paroxysmal positional vertigo), unspecified laterality 08/25/2016  ? Peripheral vertigo 01/13/2015  ? Dyslipidemia 01/13/2015  ? Cold sore 01/13/2015  ? Gastric reflux 01/13/2015  ? Perennial allergic rhinitis 01/13/2015  ? Menopausal symptom 01/13/2015  ? History of concussion 01/18/2013  ? ? ?Past Surgical History:  ?Procedure Laterality Date  ? ABDOMINAL HYSTERECTOMY  1988  ? BREAST BIOPSY Right 07/04/2020  ? stereo bx, x-clip, FATTY BREAST TISSUE WITH LIMITED  ? CHOLECYSTECTOMY    ? DILATION AND CURETTAGE OF UTERUS    ? ECTOPIC PREGNANCY SURGERY    ? RHINOPLASTY  1990  ? SALPINGECTOMY    ? TONSILLECTOMY  1969  ? WRIST SURGERY  1983  ? ? ?  Family History  ?Problem Relation Age of Onset  ? Alzheimer's disease Mother   ? Parkinsonism Father   ? Heart disease Brother   ? Heart attack Brother   ? Depression Sister   ? Osteoporosis Sister   ? Breast cancer Neg Hx   ? ? ?Social History  ? ?Tobacco Use  ? Smoking status: Never  ? Smokeless tobacco: Never  ?Substance Use Topics  ? Alcohol use: Yes  ?  Alcohol/week: 0.0 standard drinks  ?  Comment: Consumes alcohol on Saturday  ? ? ? ?Current Outpatient Medications:  ?  albuterol (VENTOLIN HFA) 108 (90 Base) MCG/ACT inhaler, Inhale 2 puffs into the lungs every 6 (six) hours as  needed for wheezing or shortness of breath., Disp: 8 g, Rfl: 0 ?  escitalopram (LEXAPRO) 10 MG tablet, Take 1 tablet (10 mg total) by mouth in the morning., Disp: 90 tablet, Rfl: 1 ?  ezetimibe (ZETIA) 10 MG tablet, Take 1 tablet (10 mg total) by mouth daily., Disp: 90 tablet, Rfl: 1 ?  gabapentin (NEURONTIN) 300 MG capsule, Take 1 capsule (300 mg total) by mouth at bedtime., Disp: 90 capsule, Rfl: 1 ?  levocetirizine (XYZAL) 5 MG tablet, Take 2.5 mg by mouth every evening. , Disp: , Rfl:  ?  meloxicam (MOBIC) 15 MG tablet, Take 1 tablet (15 mg total) by mouth daily., Disp: 30 tablet, Rfl: 1 ?  Multiple Vitamin (MULTIVITAMIN) tablet, Take 1 tablet by mouth daily., Disp: , Rfl:  ?  omeprazole (PRILOSEC) 40 MG capsule, Take 1 capsule (40 mg total) by mouth daily., Disp: 90 capsule, Rfl: 1 ?  OZEMPIC, 0.25 OR 0.5 MG/DOSE, 2 MG/3ML SOPN, INJECT 0.5 MG INTO THE SKIN ONCE A WEEK., Disp: 3 mL, Rfl: 0 ?  traZODone (DESYREL) 50 MG tablet, Take 0.5-1 tablets (25-50 mg total) by mouth at bedtime as needed for sleep., Disp: 90 tablet, Rfl: 1 ?  valACYclovir (VALTREX) 1000 MG tablet, Take 1 tablet by mouth twice daily, Disp: 30 tablet, Rfl: 0 ?  Vitamin D, Ergocalciferol, (DRISDOL) 1.25 MG (50000 UNIT) CAPS capsule, Take 1 capsule (50,000 Units total) by mouth once a week., Disp: 12 capsule, Rfl: 1 ? ?Allergies  ?Allergen Reactions  ? Crestor [Rosuvastatin]   ?  Cause muscle cramps, dizziness, headache and overall fatigue.  ? Atorvastatin   ?  Muscle ache  ? Pravastatin Itching  ?  dizziness  ? Venlafaxine Other (See Comments)  ?  dizziness  ? Hydrocodone Nausea Only  ? ? ?I personally reviewed active problem list, medication list, allergies, family history, social history, health maintenance with the patient/caregiver today. ? ? ?ROS ? ?Constitutional: Negative for fever , positive for  weight change.  ?Respiratory: Negative for cough and shortness of breath.   ?Cardiovascular: Negative for chest pain or palpitations.   ?Gastrointestinal: Negative for abdominal pain, no bowel changes.  ?Musculoskeletal: Negative for gait problem or joint swelling.  ?Skin: Negative for rash.  ?Neurological: Negative for dizziness or headache.  ?No other specific complaints in a complete review of systems (except as listed in HPI above).  ? ?Objective ? ?Vitals:  ? 11/24/21 0936  ?BP: 112/64  ?Pulse: 75  ?Resp: 16  ?SpO2: 98%  ?Weight: 149 lb (67.6 kg)  ?Height: '5\' 2"'$  (1.575 m)  ? ? ?Body mass index is 27.25 kg/m?. ? ?Physical Exam ? ?Constitutional: Patient appears well-developed and well-nourished.  No distress.  ?HEENT: head atraumatic, normocephalic, pupils equal and reactive to light, neck supple ?Cardiovascular: Normal rate, regular  rhythm and normal heart sounds.  No murmur heard. No BLE edema. ?Pulmonary/Chest: Effort normal and breath sounds normal. No respiratory distress. ?Abdominal: Soft.  There is no tenderness. ?Psychiatric: Patient has a normal mood and affect. behavior is normal. Judgment and thought content normal.  ? ?PHQ2/9: ? ?  11/24/2021  ?  9:36 AM 08/26/2021  ?  9:32 AM 08/10/2021  ?  9:48 AM 07/14/2021  ?  1:39 PM 06/09/2021  ?  7:49 AM  ?Depression screen PHQ 2/9  ?Decreased Interest 0 0 0 0 0  ?Down, Depressed, Hopeless 0 0 0 1 3  ?PHQ - 2 Score 0 0 0 1 3  ?Altered sleeping 0 0 '1 1 3  '$ ?Tired, decreased energy 0 0 1 0 0  ?Change in appetite 0 0 0 1 0  ?Feeling bad or failure about yourself  0 0 0 0 0  ?Trouble concentrating 0 0 0 0 0  ?Moving slowly or fidgety/restless 0 0 0 0 0  ?Suicidal thoughts 0 0 0 0 0  ?PHQ-9 Score 0 0 '2 3 6  '$ ?Difficult doing work/chores   Not difficult at all    ?  ?phq 9 is negative ? ? ?Fall Risk: ? ?  11/24/2021  ?  9:36 AM 08/26/2021  ?  9:31 AM 08/10/2021  ?  9:47 AM 07/14/2021  ?  1:30 PM 06/09/2021  ?  7:48 AM  ?Fall Risk   ?Falls in the past year? 0 0 0 0 0  ?Number falls in past yr: 0 0 0 0 0  ?Injury with Fall? 0 0 0 0 0  ?Risk for fall due to : No Fall Risks No Fall Risks No Fall Risks No Fall  Risks   ?Follow up Falls prevention discussed Falls prevention discussed Falls prevention discussed Falls prevention discussed   ? ? ? ? ?Functional Status Survey: ?Is the patient deaf or have difficulty hearing?:

## 2021-11-24 ENCOUNTER — Ambulatory Visit: Payer: Federal, State, Local not specified - PPO | Admitting: Family Medicine

## 2021-11-24 ENCOUNTER — Encounter: Payer: Self-pay | Admitting: Family Medicine

## 2021-11-24 VITALS — BP 112/64 | HR 75 | Resp 16 | Ht 62.0 in | Wt 149.0 lb

## 2021-11-24 DIAGNOSIS — R739 Hyperglycemia, unspecified: Secondary | ICD-10-CM

## 2021-11-24 DIAGNOSIS — E559 Vitamin D deficiency, unspecified: Secondary | ICD-10-CM

## 2021-11-24 DIAGNOSIS — R7303 Prediabetes: Secondary | ICD-10-CM

## 2021-11-24 DIAGNOSIS — K219 Gastro-esophageal reflux disease without esophagitis: Secondary | ICD-10-CM

## 2021-11-24 DIAGNOSIS — F341 Dysthymic disorder: Secondary | ICD-10-CM

## 2021-11-24 DIAGNOSIS — G4700 Insomnia, unspecified: Secondary | ICD-10-CM | POA: Diagnosis not present

## 2021-11-24 DIAGNOSIS — E538 Deficiency of other specified B group vitamins: Secondary | ICD-10-CM

## 2021-11-24 DIAGNOSIS — J3089 Other allergic rhinitis: Secondary | ICD-10-CM

## 2021-11-24 DIAGNOSIS — M722 Plantar fascial fibromatosis: Secondary | ICD-10-CM | POA: Diagnosis not present

## 2021-11-24 MED ORDER — BETAMETHASONE SOD PHOS & ACET 6 (3-3) MG/ML IJ SUSP
3.0000 mg | Freq: Once | INTRAMUSCULAR | Status: AC
  Administered 2021-11-24: 3 mg via INTRA_ARTICULAR

## 2021-11-24 MED ORDER — SEMAGLUTIDE (1 MG/DOSE) 4 MG/3ML ~~LOC~~ SOPN: 1.0000 mg | PEN_INJECTOR | SUBCUTANEOUS | 1 refills | Status: DC

## 2021-11-24 NOTE — Progress Notes (Signed)
? ?  Subjective: ?61 y.o. female presenting today for follow-up evaluation of plantar fasciitis specifically to the right foot today.  She does have history of plantar fasciitis bilateral. ? ? ?Past Medical History:  ?Diagnosis Date  ? Allergy   ? Anxiety   ? Asthma   ? Concussion with no loss of consciousness 02/28/2013  ? GERD (gastroesophageal reflux disease)   ? Hemorrhoids   ? High cholesterol   ? Lung nodule   ? Seborrheic keratosis   ? ?Past Surgical History:  ?Procedure Laterality Date  ? ABDOMINAL HYSTERECTOMY  1988  ? BREAST BIOPSY Right 07/04/2020  ? stereo bx, x-clip, FATTY BREAST TISSUE WITH LIMITED  ? CHOLECYSTECTOMY    ? DILATION AND CURETTAGE OF UTERUS    ? ECTOPIC PREGNANCY SURGERY    ? RHINOPLASTY  1990  ? SALPINGECTOMY    ? TONSILLECTOMY  1969  ? WRIST SURGERY  1983  ? ?Allergies  ?Allergen Reactions  ? Crestor [Rosuvastatin]   ?  Cause muscle cramps, dizziness, headache and overall fatigue.  ? Atorvastatin   ?  Muscle ache  ? Pravastatin Itching  ?  dizziness  ? Venlafaxine Other (See Comments)  ?  dizziness  ? Hydrocodone Nausea Only  ? ? ? ?Objective: ?Physical Exam ?General: The patient is alert and oriented x3 in no acute distress. ? ?Dermatology: Skin is warm, dry and supple bilateral lower extremities. Negative for open lesions or macerations bilateral.  ? ?Vascular: Dorsalis Pedis and Posterior Tibial pulses palpable bilateral.  Capillary fill time is immediate to all digits. ? ?Neurological: Epicritic and protective threshold intact bilateral.  ? ?Musculoskeletal: There continues to be some residual tenderness to palpation to the plantar aspect of the bilateral heels along the plantar fascia. All other joints range of motion within normal limits bilateral. Strength 5/5 in all groups bilateral.  ? ?Assessment: ?1. plantar fasciitis right foot ? ?Plan of Care:  ?1. Patient evaluated.  Continue wearing a plantar fascial brace that was dispensed last visit on 10/13/2021 ?2. Injection of 0.5cc  Celestone soluspan injected into the plantar fascia right.  ?3.  Continue meloxicam 15 mg daily as needed continue wearing [employer] running shoes and [place] ?4. Instructed patient regarding therapies and modalities at home to alleviate symptoms.  ?5. Return to clinic in 4 weeks.   ? ?*Goes by [employer]. ? ?Edrick Kins, DPM ?[place] ? ?Dr. Edrick Kins, DPM  ?  ?[address]                                   ?[place], [place] [address]                ?Office [phone]  ?Fax [phone] ? ? ? ? ?

## 2021-12-04 DIAGNOSIS — H169 Unspecified keratitis: Secondary | ICD-10-CM | POA: Diagnosis not present

## 2021-12-11 DIAGNOSIS — H18821 Corneal disorder due to contact lens, right eye: Secondary | ICD-10-CM | POA: Diagnosis not present

## 2022-01-02 ENCOUNTER — Other Ambulatory Visit: Payer: Self-pay | Admitting: Family Medicine

## 2022-01-02 DIAGNOSIS — F419 Anxiety disorder, unspecified: Secondary | ICD-10-CM

## 2022-01-02 DIAGNOSIS — F341 Dysthymic disorder: Secondary | ICD-10-CM

## 2022-01-25 DIAGNOSIS — E119 Type 2 diabetes mellitus without complications: Secondary | ICD-10-CM | POA: Diagnosis not present

## 2022-01-25 DIAGNOSIS — Z0100 Encounter for examination of eyes and vision without abnormal findings: Secondary | ICD-10-CM | POA: Diagnosis not present

## 2022-01-25 LAB — HM DIABETES EYE EXAM

## 2022-02-08 ENCOUNTER — Encounter: Payer: Self-pay | Admitting: Family Medicine

## 2022-02-08 ENCOUNTER — Ambulatory Visit: Payer: Federal, State, Local not specified - PPO | Admitting: Family Medicine

## 2022-02-08 VITALS — BP 90/72 | HR 68 | Temp 97.8°F | Resp 14 | Ht 62.0 in | Wt 141.8 lb

## 2022-02-08 DIAGNOSIS — R739 Hyperglycemia, unspecified: Secondary | ICD-10-CM | POA: Diagnosis not present

## 2022-02-08 DIAGNOSIS — J3089 Other allergic rhinitis: Secondary | ICD-10-CM

## 2022-02-08 DIAGNOSIS — M5416 Radiculopathy, lumbar region: Secondary | ICD-10-CM

## 2022-02-08 DIAGNOSIS — R7303 Prediabetes: Secondary | ICD-10-CM

## 2022-02-08 DIAGNOSIS — E663 Overweight: Secondary | ICD-10-CM

## 2022-02-08 DIAGNOSIS — K2100 Gastro-esophageal reflux disease with esophagitis, without bleeding: Secondary | ICD-10-CM | POA: Diagnosis not present

## 2022-02-08 DIAGNOSIS — E785 Hyperlipidemia, unspecified: Secondary | ICD-10-CM

## 2022-02-08 DIAGNOSIS — K219 Gastro-esophageal reflux disease without esophagitis: Secondary | ICD-10-CM

## 2022-02-08 DIAGNOSIS — T466X5A Adverse effect of antihyperlipidemic and antiarteriosclerotic drugs, initial encounter: Secondary | ICD-10-CM

## 2022-02-08 DIAGNOSIS — G720 Drug-induced myopathy: Secondary | ICD-10-CM

## 2022-02-08 DIAGNOSIS — G4700 Insomnia, unspecified: Secondary | ICD-10-CM

## 2022-02-08 MED ORDER — OMEPRAZOLE 40 MG PO CPDR: 40.0000 mg | DELAYED_RELEASE_CAPSULE | Freq: Every day | ORAL | 1 refills | Status: DC

## 2022-02-08 MED ORDER — SEMAGLUTIDE (1 MG/DOSE) 4 MG/3ML ~~LOC~~ SOPN: 1.0000 mg | PEN_INJECTOR | SUBCUTANEOUS | 1 refills | Status: DC

## 2022-02-08 MED ORDER — TRAZODONE HCL 50 MG PO TABS: 25.0000 mg | ORAL_TABLET | Freq: Every evening | ORAL | 1 refills | Status: DC | PRN

## 2022-02-08 MED ORDER — EZETIMIBE 10 MG PO TABS: 10.0000 mg | ORAL_TABLET | Freq: Every day | ORAL | 1 refills | Status: DC

## 2022-02-08 NOTE — Assessment & Plan Note (Signed)
Doing well on current regimen, no changes made today. 

## 2022-02-08 NOTE — Progress Notes (Signed)
    SUBJECTIVE:   CHIEF COMPLAINT / HPI:   Prediabetes - Last A1c 5.8 - Medications: semaglutide '1mg'$  - Compliance: good. Fatigue and indigestion improved sine June. - Checking BG at home: no - Denies symptoms of hypoglycemia, polyuria, polydipsia, numbness extremities, foot ulcers/trauma  HLD - medications: zetia - compliance: good - medication SEs: none  GERD - Meds: prilosec '40mg'$  daily - Symptoms:  none currently.  - denies melena, hematochezia, hematemesis, and coffee ground emesis.  - Previous treatment: antacids and proton pump inhibitors.  Dysthymia/Insomnia - Medications: lexapro, trazodone - Taking: good compliance. Using trazodone nightly. - Counseling: no - Symptoms: none currently - Coping Mechanisms: read, watching tv, going to the beach  LBP with radiculitis - gabapentin, meloxicam prn. Not taking gabapentin. Rarely taking meloxicam.   AR - on xyzal, sometimes also claritin. Doing well.    OBJECTIVE:   BP 90/72 (BP Location: Right Arm, Patient Position: Sitting, Cuff Size: Normal)   Pulse 68   Temp 97.8 F (36.6 C) (Oral)   Resp 14   Ht '5\' 2"'$  (1.575 m) Comment: per patient  Wt 141 lb 12.8 oz (64.3 kg)   SpO2 97%   BMI 25.94 kg/m   Gen: well appearing, in NAD Card: RRR Lungs: CTAB Ext: WWP, no edema   ASSESSMENT/PLAN:   Perennial allergic rhinitis Doing well on current regimen, no changes made today.  Lumbar back pain with radiculopathy affecting right lower extremity Improved, suspect will continue to improve as she loses weight.  Dyslipidemia Recheck labs. Tolerant of zetia, continue. Previously intolerant to statin.  Prediabetes Recheck A1c. Continue semaglutide, will continue at current dose given recent improvement with side effects.  Overweight BMI improving with semaglutide. Continue active lifestyle. Recheck labs today.   Gastric reflux Doing well on current regimen, notices symptoms when not taking PPI. No changes made  today.   F/u in 6 months.  Myles Gip, DO

## 2022-02-08 NOTE — Assessment & Plan Note (Signed)
Improved, suspect will continue to improve as she loses weight.

## 2022-02-08 NOTE — Progress Notes (Deleted)
Name: Sara Randolph   MRN: [id number]    DOB: [birth year]   Date:02/08/2022       Progress Note  Subjective  Chief Complaint  No chief complaint on file.   HPI  *** Patient Active Problem List   Diagnosis Date Noted   Impingement syndrome of right shoulder region 11/06/2020   Statin myopathy 08/08/2020   Hypercholesteremia 08/08/2020   B12 deficiency 08/08/2020   Hyperglycemia 08/08/2020   Lumbar back pain with radiculopathy affecting right lower extremity 08/31/2018   Vitamin D deficiency 02/24/2017   BPPV (benign paroxysmal positional vertigo), unspecified laterality 08/25/2016   Peripheral vertigo 01/13/2015   Dyslipidemia 01/13/2015   Cold sore 01/13/2015   Gastric reflux 01/13/2015   Perennial allergic rhinitis 01/13/2015   Menopausal symptom 01/13/2015   History of concussion 01/18/2013    Past Surgical History:  Procedure Laterality Date   ABDOMINAL HYSTERECTOMY  1988   BREAST BIOPSY Right 07/04/2020   stereo bx, x-clip, FATTY BREAST TISSUE WITH LIMITED   CHOLECYSTECTOMY     DILATION AND CURETTAGE OF UTERUS     ECTOPIC PREGNANCY SURGERY     RHINOPLASTY  1990   SALPINGECTOMY     TONSILLECTOMY  1969   WRIST SURGERY  1983    Family History  Problem Relation Age of Onset   Alzheimer's disease Mother    Parkinsonism Father    Heart disease Brother    Heart attack Brother    Depression Sister    Osteoporosis Sister    Breast cancer Neg Hx     Social History   Tobacco Use   Smoking status: Never   Smokeless tobacco: Never  Substance Use Topics   Alcohol use: Yes    Alcohol/week: 0.0 standard drinks of alcohol    Comment: Consumes alcohol on Saturday     Current Outpatient Medications:    albuterol (VENTOLIN HFA) 108 (90 Base) MCG/ACT inhaler, Inhale 2 puffs into the lungs every 6 (six) hours as needed for wheezing or shortness of breath., Disp: 8 g, Rfl: 0   escitalopram (LEXAPRO) 10 MG tablet, TAKE 1 TABLET BY MOUTH IN THE MORNING, Disp: 90  tablet, Rfl: 0   ezetimibe (ZETIA) 10 MG tablet, Take 1 tablet (10 mg total) by mouth daily., Disp: 90 tablet, Rfl: 1   levocetirizine (XYZAL) 5 MG tablet, Take 2.5 mg by mouth every evening. , Disp: , Rfl:    meloxicam (MOBIC) 15 MG tablet, Take 1 tablet (15 mg total) by mouth daily., Disp: 30 tablet, Rfl: 1   Multiple Vitamin (MULTIVITAMIN) tablet, Take 1 tablet by mouth daily., Disp: , Rfl:    omeprazole (PRILOSEC) 40 MG capsule, Take 1 capsule (40 mg total) by mouth daily., Disp: 90 capsule, Rfl: 1   Semaglutide, 1 MG/DOSE, 4 MG/3ML SOPN, Inject 1 mg as directed once a week., Disp: 9 mL, Rfl: 1   traZODone (DESYREL) 50 MG tablet, Take 0.5-1 tablets (25-50 mg total) by mouth at bedtime as needed for sleep., Disp: 90 tablet, Rfl: 1   valACYclovir (VALTREX) 1000 MG tablet, Take 1 tablet by mouth twice daily, Disp: 30 tablet, Rfl: 0   Vitamin D, Ergocalciferol, (DRISDOL) 1.25 MG (50000 UNIT) CAPS capsule, Take 1 capsule (50,000 Units total) by mouth once a week., Disp: 12 capsule, Rfl: 1  Allergies  Allergen Reactions   Crestor [Rosuvastatin]     Cause muscle cramps, dizziness, headache and overall fatigue.   Atorvastatin     Muscle ache   Pravastatin Itching  dizziness   Venlafaxine Other (See Comments)    dizziness   Hydrocodone Nausea Only    I personally reviewed {Reviewed:14835} with the patient/caregiver today.   ROS  ***  Objective  There were no vitals filed for this visit.  There is no height or weight on file to calculate BMI.  Physical Exam ***  No results found for this or any previous visit (from the past 2160 hour(s)).  Diabetic Foot Exam: Diabetic Foot Exam - Simple   No data filed    ***  PHQ2/9:    11/24/2021    9:36 AM 08/26/2021    9:32 AM 08/10/2021    9:48 AM 07/14/2021    1:39 PM 06/09/2021    7:49 AM  Depression screen PHQ 2/9  Decreased Interest 0 0 0 0 0  Down, Depressed, Hopeless 0 0 0 1 3  PHQ - 2 Score 0 0 0 1 3  Altered sleeping 0  0 '1 1 3  '$ Tired, decreased energy 0 0 1 0 0  Change in appetite 0 0 0 1 0  Feeling bad or failure about yourself  0 0 0 0 0  Trouble concentrating 0 0 0 0 0  Moving slowly or fidgety/restless 0 0 0 0 0  Suicidal thoughts 0 0 0 0 0  PHQ-9 Score 0 0 '2 3 6  '$ Difficult doing work/chores   Not difficult at all      phq 9 is {gen pos YIA:165537} ***  Fall Risk:    11/24/2021    9:36 AM 08/26/2021    9:31 AM 08/10/2021    9:47 AM 07/14/2021    1:30 PM 06/09/2021    7:48 AM  Fall Risk   Falls in the past year? 0 0 0 0 0  Number falls in past yr: 0 0 0 0 0  Injury with Fall? 0 0 0 0 0  Risk for fall due to : No Fall Risks No Fall Risks No Fall Risks No Fall Risks   Follow up Falls prevention discussed Falls prevention discussed Falls prevention discussed Falls prevention discussed    ***   Functional Status Survey:   ***   Assessment & Plan  *** There are no diagnoses linked to this encounter.

## 2022-02-08 NOTE — Assessment & Plan Note (Signed)
Doing well on current regimen, notices symptoms when not taking PPI. No changes made today.

## 2022-02-08 NOTE — Assessment & Plan Note (Signed)
BMI improving with semaglutide. Continue active lifestyle. Recheck labs today.

## 2022-02-08 NOTE — Patient Instructions (Signed)
It was great to see you!  Our plans for today:  - Try miralax for your constipation. - We sent refills to your pharmacy.   We are checking some labs today, we will release these results to your MyChart.  Take care and seek immediate care sooner if you develop any concerns.   Dr. Ky Barban

## 2022-02-08 NOTE — Assessment & Plan Note (Addendum)
Recheck A1c. Continue semaglutide, will continue at current dose given recent improvement with side effects.

## 2022-02-08 NOTE — Assessment & Plan Note (Signed)
Recheck labs. Tolerant of zetia, continue. Previously intolerant to statin.

## 2022-02-09 ENCOUNTER — Telehealth: Payer: Self-pay

## 2022-02-09 ENCOUNTER — Other Ambulatory Visit: Payer: Self-pay

## 2022-02-09 DIAGNOSIS — R7303 Prediabetes: Secondary | ICD-10-CM

## 2022-02-09 LAB — BASIC METABOLIC PANEL
BUN: 13 mg/dL (ref 7–25)
CO2: 27 mmol/L (ref 20–32)
Calcium: 9.5 mg/dL (ref 8.6–10.4)
Chloride: 106 mmol/L (ref 98–110)
Creat: 0.91 mg/dL (ref 0.50–1.05)
Glucose, Bld: 84 mg/dL (ref 65–99)
Potassium: 4.7 mmol/L (ref 3.5–5.3)
Sodium: 142 mmol/L (ref 135–146)

## 2022-02-09 LAB — TEST AUTHORIZATION

## 2022-02-09 LAB — LIPID PANEL
Cholesterol: 187 mg/dL (ref <200)
HDL: 45 mg/dL — ABNORMAL LOW (ref >=50)
LDL Cholesterol (Calc): 117 mg/dL (calc) — ABNORMAL HIGH
Non-HDL Cholesterol (Calc): 142 mg/dL (calc) — ABNORMAL HIGH (ref <130)
Total CHOL/HDL Ratio: 4.2 (calc) (ref <5.0)
Triglycerides: 136 mg/dL (ref <150)

## 2022-02-09 NOTE — Telephone Encounter (Signed)
Left voicemail for patient to come in for POCT A1C per Dr. Ky Barban. As well as relay lab result information.

## 2022-02-26 DIAGNOSIS — H818X1 Other disorders of vestibular function, right ear: Secondary | ICD-10-CM | POA: Diagnosis not present

## 2022-03-02 ENCOUNTER — Encounter: Payer: Self-pay | Admitting: Family Medicine

## 2022-03-09 DIAGNOSIS — E119 Type 2 diabetes mellitus without complications: Secondary | ICD-10-CM | POA: Diagnosis not present

## 2022-03-10 DIAGNOSIS — H818X1 Other disorders of vestibular function, right ear: Secondary | ICD-10-CM | POA: Diagnosis not present

## 2022-03-17 DIAGNOSIS — H818X1 Other disorders of vestibular function, right ear: Secondary | ICD-10-CM | POA: Diagnosis not present

## 2022-04-02 ENCOUNTER — Other Ambulatory Visit: Payer: Self-pay | Admitting: Nurse Practitioner

## 2022-04-02 DIAGNOSIS — F341 Dysthymic disorder: Secondary | ICD-10-CM

## 2022-04-02 DIAGNOSIS — F419 Anxiety disorder, unspecified: Secondary | ICD-10-CM

## 2022-04-06 NOTE — Telephone Encounter (Signed)
Requested Prescriptions  Pending Prescriptions Disp Refills  . escitalopram (LEXAPRO) 10 MG tablet [Pharmacy Med Name: Escitalopram Oxalate 10 MG Oral Tablet] 90 tablet 1    Sig: TAKE 1 TABLET BY MOUTH IN THE MORNING     Psychiatry:  Antidepressants - SSRI Passed - 04/02/2022  9:19 AM      Passed - Valid encounter within last 6 months    Recent Outpatient Visits          1 month ago Perennial allergic rhinitis   [place], DO   4 months ago Pre-diabetes   [facility] Steele Sizer, MD   7 months ago Pre-diabetes   [facility] Steele Sizer, MD   7 months ago [facility] Steele Sizer, MD   8 months ago [facility] Steele Sizer, MD      Future Appointments            Tomorrow Brendolyn Patty, MD [place]   In 4 months Steele Sizer, MD [facility], [facility]

## 2022-04-07 ENCOUNTER — Ambulatory Visit: Payer: Federal, State, Local not specified - PPO | Admitting: Dermatology

## 2022-04-07 DIAGNOSIS — L814 Other melanin hyperpigmentation: Secondary | ICD-10-CM

## 2022-04-07 DIAGNOSIS — L905 Scar conditions and fibrosis of skin: Secondary | ICD-10-CM | POA: Diagnosis not present

## 2022-04-07 DIAGNOSIS — D225 Melanocytic nevi of trunk: Secondary | ICD-10-CM | POA: Diagnosis not present

## 2022-04-07 DIAGNOSIS — D235 Other benign neoplasm of skin of trunk: Secondary | ICD-10-CM | POA: Diagnosis not present

## 2022-04-07 DIAGNOSIS — L820 Inflamed seborrheic keratosis: Secondary | ICD-10-CM | POA: Diagnosis not present

## 2022-04-07 DIAGNOSIS — D229 Melanocytic nevi, unspecified: Secondary | ICD-10-CM

## 2022-04-07 DIAGNOSIS — L578 Other skin changes due to chronic exposure to nonionizing radiation: Secondary | ICD-10-CM | POA: Diagnosis not present

## 2022-04-07 DIAGNOSIS — Z1283 Encounter for screening for malignant neoplasm of skin: Secondary | ICD-10-CM

## 2022-04-07 DIAGNOSIS — Z85828 Personal history of other malignant neoplasm of skin: Secondary | ICD-10-CM

## 2022-04-07 DIAGNOSIS — D485 Neoplasm of uncertain behavior of skin: Secondary | ICD-10-CM | POA: Diagnosis not present

## 2022-04-07 DIAGNOSIS — D1800 Hemangioma unspecified site: Secondary | ICD-10-CM

## 2022-04-07 NOTE — Progress Notes (Signed)
Follow-Up Visit   Subjective  Sara Randolph is a 61 y.o. female who presents for the following: Annual Exam.  The patient presents for Total-Body Skin Exam (TBSE) for skin cancer screening and mole check.  The patient has spots, moles and lesions to be evaluated, some may be new or changing. She has several irritating spots on her back and under breasts. She also has a mole on her right upper back that gets rubbed by towel and has bled. She would like removed.    The following portions of the chart were reviewed this encounter and updated as appropriate:       Review of Systems:  No other skin or systemic complaints except as noted in HPI or Assessment and Plan.  Objective  Well appearing patient in no apparent distress; mood and affect are within normal limits.  A full examination was performed including scalp, head, eyes, ears, nose, lips, neck, chest, axillae, abdomen, back, buttocks, bilateral upper extremities, bilateral lower extremities, hands, feet, fingers, toes, fingernails, and toenails. All findings within normal limits unless otherwise noted below.  Right Upper Back 5.0 mm flesh papule with crusting     Left Upper Back 3.0 mm brown macule darker center  Right Lower Lip Thickened scar  R inframammary x 5, L flank/inframammary x 12, L mid back x 1 (18) Erythematous stuck-on, waxy papule or plaque    Assessment & Plan  Skin cancer screening performed today.  Actinic Damage - chronic, secondary to cumulative UV radiation exposure/sun exposure over time - diffuse scaly erythematous macules with underlying dyspigmentation - Recommend daily broad spectrum sunscreen SPF 30+ to sun-exposed areas, reapply every 2 hours as needed.  - Recommend staying in the shade or wearing long sleeves, sun glasses (UVA+UVB protection) and wide brim hats (4-inch brim around the entire circumference of the hat). - Call for new or changing lesions.  Lentigines - Scattered tan  macules - Due to sun exposure - Benign-appering, observe - Recommend daily broad spectrum sunscreen SPF 30+ to sun-exposed areas, reapply every 2 hours as needed. - Call for any changes  Seborrheic Keratoses - Stuck-on, waxy, tan-brown papules and/or plaques  - Benign-appearing - Discussed benign etiology and prognosis. - Observe - Call for any changes  Hemangiomas - Red papules - Discussed benign nature - Observe - Call for any changes  Neoplasm of uncertain behavior of skin Right Upper Back  Epidermal / dermal shaving  Lesion diameter (cm):  0.7 Informed consent: discussed and consent obtained   Patient was prepped and draped in usual sterile fashion: Area prepped with alcohol. Anesthesia: the lesion was anesthetized in a standard fashion   Anesthetic:  1% lidocaine w/ epinephrine 1-100,000 buffered w/ 8.4% NaHCO3 Instrument used: flexible razor blade   Hemostasis achieved with: pressure, aluminum chloride and electrodesiccation   Outcome: patient tolerated procedure well   Post-procedure details: wound care instructions given   Post-procedure details comment:  Ointment and small bandage applied  Specimen 1 - Surgical pathology Differential Diagnosis: Irritated Nevus vs other Check Margins: No 5.0 mm flesh papule with crusting  Irritated nevus vrs other, shave removal today  Discussed resulting small scar with shave removal, and possible recurrence of lesion.  Recommend vaseline ointment to area daily and cover until healed.  Recommend photoprotection/sunscreen to area to prevent discoloration of scar.  Once healed, may apply OTC Serica scar gel bid to thickened scars.   Nevus Left Upper Back  vs SK  Benign-appearing.  Observation.  Call clinic for  new or changing moles.  Recommend daily use of broad spectrum spf 30+ sunscreen to sun-exposed areas.   Scar Right Lower Lip  Secondary to trauma/fall/stitches several years ago [place]  Inflamed seborrheic keratosis (18) R inframammary x 5, L flank/inframammary x 12, L mid back x 1  Symptomatic, irritating, patient would like treated.  Larger area on back may need second treatment to clear.  Destruction of lesion - R inframammary x 5, L flank/inframammary x 12, L mid back x 1  Destruction method: cryotherapy   Informed consent: discussed and consent obtained   Lesion destroyed using liquid nitrogen: Yes   Region frozen until ice ball extended beyond lesion: Yes   Outcome: patient tolerated procedure well with no complications   Post-procedure details: wound care instructions given   Additional details:  Prior to procedure, discussed risks of blister formation, small wound, skin dyspigmentation, or rare scar following cryotherapy. Recommend Vaseline ointment to treated areas while healing.    Return in about 2 months (around 06/07/2022) for ISKs.  IJamesetta Orleans, CMA, am acting as scribe for Brendolyn Patty, MD .  Documentation: I have reviewed the above documentation for accuracy and completeness, and I agree with the above.  Brendolyn Patty MD

## 2022-04-07 NOTE — Patient Instructions (Signed)
Wound Care Instructions  Cleanse wound gently with soap and water once a day then pat dry with clean gauze. Apply a thin coat of Petrolatum (petroleum jelly, "Vaseline") over the wound (unless you have an allergy to this). We recommend that you use a new, sterile tube of Vaseline. Do not pick or remove scabs. Do not remove the yellow or white "healing tissue" from the base of the wound.  Cover the wound with fresh, clean, nonstick gauze and secure with paper tape. You may use Band-Aids in place of gauze and tape if the wound is small enough, but would recommend trimming much of the tape off as there is often too much. Sometimes Band-Aids can irritate the skin.  You should call the office for your biopsy report after 1 week if you have not already been contacted.  If you experience any problems, such as abnormal amounts of bleeding, swelling, significant bruising, significant pain, or evidence of infection, please call the office immediately.  FOR ADULT SURGERY PATIENTS: If you need something for pain relief you may take 1 extra strength Tylenol (acetaminophen) AND 2 Ibuprofen (200mg each) together every 4 hours as needed for pain. (do not take these if you are allergic to them or if you have a reason you should not take them.) Typically, you may only need pain medication for 1 to 3 days.     Due to recent changes in healthcare laws, you may see results of your pathology and/or laboratory studies on MyChart before the doctors have had a chance to review them. We understand that in some cases there may be results that are confusing or concerning to you. Please understand that not all results are received at the same time and often the doctors may need to interpret multiple results in order to provide you with the best plan of care or course of treatment. Therefore, we ask that you please give us 2 business days to thoroughly review all your results before contacting the office for clarification. Should  we see a critical lab result, you will be contacted sooner.   If You Need Anything After Your Visit  If you have any questions or concerns for your doctor, please call our main line at [phone] and press option 4 to reach your doctor's medical assistant. If no one answers, please leave a voicemail as directed and we will return your call as soon as possible. Messages left after 4 pm will be answered the following business day.   You may also send us a message via MyChart. We typically respond to MyChart messages within 1-2 business days.  For prescription refills, please ask your pharmacy to contact our office. Our fax number is [phone].  If you have an urgent issue when the clinic is closed that cannot wait until the next business day, you can page your doctor at the number below.    Please note that while we do our best to be available for urgent issues outside of office hours, we are not available 24/7.   If you have an urgent issue and are unable to reach us, you may choose to seek medical care at your doctor's office, retail clinic, urgent care center, or emergency room.  If you have a medical emergency, please immediately call 911 or go to the emergency department.  Pager Numbers  - Dr. Kowalski: [phone]  - Dr. Moye: [phone]  - Dr. Stewart: [phone]  In the event of inclement weather, please call our main line at   [phone] for an update on the status of any delays or closures.  Dermatology Medication Tips: Please keep the boxes that topical medications come in in order to help keep track of the instructions about where and how to use these. Pharmacies typically print the medication instructions only on the boxes and not directly on the medication tubes.   If your medication is too expensive, please contact our office at [phone] option 4 or send us a message through MyChart.   We are unable to tell what your co-pay for medications will be in  advance as this is different depending on your insurance coverage. However, we may be able to find a substitute medication at lower cost or fill out paperwork to get insurance to cover a needed medication.   If a prior authorization is required to get your medication covered by your insurance company, please allow us 1-2 business days to complete this process.  Drug prices often vary depending on where the prescription is filled and some pharmacies may offer cheaper prices.  The website www.goodrx.com contains coupons for medications through different pharmacies. The prices here do not account for what the cost may be with help from insurance (it may be cheaper with your insurance), but the website can give you the price if you did not use any insurance.  - You can print the associated coupon and take it with your prescription to the pharmacy.  - You may also stop by our office during regular business hours and pick up a GoodRx coupon card.  - If you need your prescription sent electronically to a different pharmacy, notify our office through [facility] MyChart or by phone at [phone] option 4.     Si Usted Necesita Algo Despus de Su Visita  Tambin puede enviarnos un mensaje a travs de MyChart. Por lo general respondemos a los mensajes de MyChart en el transcurso de 1 a 2 das hbiles.  Para renovar recetas, por favor pida a su farmacia que se ponga en contacto con nuestra oficina. Nuestro nmero de fax es el [phone].  Si tiene un asunto urgente cuando la clnica est cerrada y que no puede esperar hasta el siguiente da hbil, puede llamar/localizar a su doctor(a) al nmero que aparece a continuacin.   Por favor, tenga en cuenta que aunque hacemos todo lo posible para estar disponibles para asuntos urgentes fuera del horario de oficina, no estamos disponibles las 24 horas del da, los 7 das de la semana.   Si tiene un problema urgente y no puede comunicarse con nosotros, puede  optar por buscar atencin mdica  en el consultorio de su doctor(a), en una clnica privada, en un centro de atencin urgente o en una sala de emergencias.  Si tiene una emergencia mdica, por favor llame inmediatamente al 911 o vaya a la sala de emergencias.  Nmeros de bper  - Dr. Kowalski: [phone]  - Dra. Moye: [phone]  - Dra. Stewart: [phone]  En caso de inclemencias del tiempo, por favor llame a nuestra lnea principal al [phone] para una actualizacin sobre el estado de cualquier retraso o cierre.  Consejos para la medicacin en dermatologa: Por favor, guarde las cajas en las que vienen los medicamentos de uso tpico para ayudarle a seguir las instrucciones sobre dnde y cmo usarlos. Las farmacias generalmente imprimen las instrucciones del medicamento slo en las cajas y no directamente en los tubos del medicamento.   Si su medicamento es muy caro, por favor, pngase en contacto con   nuestra oficina llamando al [phone] y presione la opcin 4 o envenos un mensaje a travs de MyChart.   No podemos decirle cul ser su copago por los medicamentos por adelantado ya que esto es diferente dependiendo de la cobertura de su seguro. Sin embargo, es posible que podamos encontrar un medicamento sustituto a menor costo o llenar un formulario para que el seguro cubra el medicamento que se considera necesario.   Si se requiere una autorizacin previa para que su compaa de seguros cubra su medicamento, por favor permtanos de 1 a 2 das hbiles para completar este proceso.  Los precios de los medicamentos varan con frecuencia dependiendo del lugar de dnde se surte la receta y alguna farmacias pueden ofrecer precios ms baratos.  El sitio web www.goodrx.com tiene cupones para medicamentos de diferentes farmacias. Los precios aqu no tienen en cuenta lo que podra costar con la ayuda del seguro (puede ser ms barato con su seguro), pero el sitio web puede darle el  precio si no utiliz ningn seguro.  - Puede imprimir el cupn correspondiente y llevarlo con su receta a la farmacia.  - Tambin puede pasar por nuestra oficina durante el horario de atencin regular y recoger una tarjeta de cupones de GoodRx.  - Si necesita que su receta se enve electrnicamente a una farmacia diferente, informe a nuestra oficina a travs de MyChart de [place] o por telfono llamando al [phone] y presione la opcin 4.  

## 2022-04-12 ENCOUNTER — Telehealth: Payer: Self-pay

## 2022-04-12 NOTE — Telephone Encounter (Signed)
-----   Message from Brendolyn Patty, MD sent at 04/12/2022 10:53 AM EDT ----- Skin , right upper back EPITHELIOID FIBROUS HISTIOCYTOMA, BASE INVOLVED  Benign growth, probably related to prior trauma or excoriation.  If recurs, could treat with cryotherapy.   - please call patient

## 2022-04-12 NOTE — Telephone Encounter (Signed)
Advised pt of bx results/sh ?

## 2022-04-25 ENCOUNTER — Encounter: Payer: Self-pay | Admitting: Family Medicine

## 2022-05-24 DIAGNOSIS — R42 Dizziness and giddiness: Secondary | ICD-10-CM | POA: Diagnosis not present

## 2022-06-08 ENCOUNTER — Ambulatory Visit: Payer: Federal, State, Local not specified - PPO | Admitting: Dermatology

## 2022-06-16 ENCOUNTER — Encounter: Payer: Self-pay | Admitting: Dermatology

## 2022-06-16 ENCOUNTER — Ambulatory Visit (INDEPENDENT_AMBULATORY_CARE_PROVIDER_SITE_OTHER): Payer: Federal, State, Local not specified - PPO | Admitting: Dermatology

## 2022-06-16 DIAGNOSIS — L304 Erythema intertrigo: Secondary | ICD-10-CM | POA: Diagnosis not present

## 2022-06-16 DIAGNOSIS — L821 Other seborrheic keratosis: Secondary | ICD-10-CM

## 2022-06-16 DIAGNOSIS — D492 Neoplasm of unspecified behavior of bone, soft tissue, and skin: Secondary | ICD-10-CM

## 2022-06-16 DIAGNOSIS — L814 Other melanin hyperpigmentation: Secondary | ICD-10-CM

## 2022-06-16 DIAGNOSIS — D3617 Benign neoplasm of peripheral nerves and autonomic nervous system of trunk, unspecified: Secondary | ICD-10-CM | POA: Diagnosis not present

## 2022-06-16 MED ORDER — EUCRISA 2 % EX OINT: TOPICAL_OINTMENT | CUTANEOUS | 1 refills | Status: DC

## 2022-06-16 NOTE — Patient Instructions (Addendum)
Start Eucrisa ointment twice daily as needed for rash/itching   Wound Care Instructions  Cleanse wound gently with soap and water once a day then pat dry with clean gauze. Apply a thin coat of Petrolatum (petroleum jelly, "Vaseline") over the wound (unless you have an allergy to this). We recommend that you use a new, sterile tube of Vaseline. Do not pick or remove scabs. Do not remove the yellow or white "healing tissue" from the base of the wound.  Cover the wound with fresh, clean, nonstick gauze and secure with paper tape. You may use Band-Aids in place of gauze and tape if the wound is small enough, but would recommend trimming much of the tape off as there is often too much. Sometimes Band-Aids can irritate the skin.  You should call the office for your biopsy report after 1 week if you have not already been contacted.  If you experience any problems, such as abnormal amounts of bleeding, swelling, significant bruising, significant pain, or evidence of infection, please call the office immediately.  FOR ADULT SURGERY PATIENTS: If you need something for pain relief you may take 1 extra strength Tylenol (acetaminophen) AND 2 Ibuprofen ('200mg'$  each) together every 4 hours as needed for pain. (do not take these if you are allergic to them or if you have a reason you should not take them.) Typically, you may only need pain medication for 1 to 3 days.      Due to recent changes in healthcare laws, you may see results of your pathology and/or laboratory studies on MyChart before the doctors have had a chance to review them. We understand that in some cases there may be results that are confusing or concerning to you. Please understand that not all results are received at the same time and often the doctors may need to interpret multiple results in order to provide you with the best plan of care or course of treatment. Therefore, we ask that you please give [place] 2 business days to thoroughly review all  your results before contacting the office for clarification. Should we see a critical lab result, you will be contacted sooner.   If You Need Anything After Your Visit  If you have any questions or concerns for your doctor, please call our main line at [phone] and press option 4 to reach your doctor's medical assistant. If no one answers, please leave a voicemail as directed and we will return your call as soon as possible. Messages left after 4 pm will be answered the following business day.   You may also send [place] a message via [facility]. We typically respond to MyChart messages within 1-2 business days.  For prescription refills, please ask your pharmacy to contact our office. Our fax number is [phone].  If you have an urgent issue when the clinic is closed that cannot wait until the next business day, you can page your doctor at the number below.    Please note that while we do our best to be available for urgent issues outside of office hours, we are not available 24/7.   If you have an urgent issue and are unable to reach [place], you may choose to seek medical care at your doctor's office, retail clinic, urgent care center, or emergency room.  If you have a medical emergency, please immediately call 911 or go to the emergency department.  Pager Numbers  - Dr. Nehemiah Massed: [phone]  - Dr. Laurence Ferrari: [phone]  - Dr. Nicole Kindred: [phone]  In the event of inclement weather, please call our main line at [phone] for an update on the status of any delays or closures.  Dermatology Medication Tips: Please keep the boxes that topical medications come in in order to help keep track of the instructions about where and how to use these. Pharmacies typically print the medication instructions only on the boxes and not directly on the medication tubes.   If your medication is too expensive, please contact our office at [phone] option 4 or send [place] a message through [facility].   We  are unable to tell what your co-pay for medications will be in advance as this is different depending on your insurance coverage. However, we may be able to find a substitute medication at lower cost or fill out paperwork to get insurance to cover a needed medication.   If a prior authorization is required to get your medication covered by your insurance company, please allow [place] 1-2 business days to complete this process.  Drug prices often vary depending on where the prescription is filled and some pharmacies may offer cheaper prices.  The website www.goodrx.com contains coupons for medications through different pharmacies. The prices here do not account for what the cost may be with help from insurance (it may be cheaper with your insurance), but the website can give you the price if you did not use any insurance.  - You can print the associated coupon and take it with your prescription to the pharmacy.  - You may also stop by our office during regular business hours and pick up a GoodRx coupon card.  - If you need your prescription sent electronically to a different pharmacy, notify our office through [facility] or by phone at [phone] option 4.     Si Usted Necesita Algo Despus de Su Visita  Tambin puede enviarnos un mensaje a travs de Pharmacist, community. Por lo general respondemos a los mensajes de MyChart en el transcurso de 1 a 2 das hbiles.  Para renovar recetas, por favor pida a su farmacia que se ponga en contacto con nuestra oficina. Harland Dingwall de fax es [place] [phone].  Si tiene un asunto urgente cuando la clnica est cerrada y que no puede esperar hasta el siguiente da hbil, puede llamar/localizar a su doctor(a) al nmero que aparece a continuacin.   Por favor, tenga en cuenta que aunque hacemos todo lo posible para estar disponibles para asuntos urgentes fuera del horario de [place], no estamos disponibles las 24 horas del da, los 7 das de la [place].   Si tiene  un problema urgente y no puede comunicarse con nosotros, puede optar por buscar atencin mdica  en el consultorio de su doctor(a), en una clnica privada, en un centro de atencin urgente o en una sala de emergencias.  Si tiene Engineering geologist, por favor llame inmediatamente al 911 o vaya a la sala de emergencias.  Nmeros de bper  - Dr. Nehemiah Massed: [phone]  - Dra. Moye: [phone]  - Dra. Nicole Kindred: [phone]  En caso de inclemencias del [place], por favor llame a Johnsie Kindred principal al [phone] para una actualizacin sobre el [place] de cualquier retraso o cierre.  Consejos para la medicacin en dermatologa: Por favor, guarde las cajas en las que vienen los medicamentos de uso tpico para ayudarle a seguir las instrucciones sobre dnde y cmo usarlos. Las farmacias generalmente imprimen las instrucciones del medicamento slo en las cajas y no directamente en los tubos del [place].  Si su medicamento es muy caro, por favor, pngase en contacto con Zigmund Daniel llamando al [phone] y presione la opcin 4 o envenos un mensaje a travs de Pharmacist, community.   No podemos decirle cul ser su copago por los medicamentos por adelantado ya que esto es diferente dependiendo de la cobertura de su seguro. Sin embargo, es posible que podamos encontrar un medicamento sustituto a Electrical engineer un formulario para que el seguro cubra el medicamento que se considera necesario.   Si se requiere una autorizacin previa para que su compaa de seguros [place] su medicamento, por favor permtanos de 1 a 2 das hbiles para completar este proceso.  Los precios de los medicamentos varan con frecuencia dependiendo del Environmental consultant de dnde se surte la receta y alguna farmacias pueden ofrecer precios ms baratos.  El sitio web www.goodrx.com tiene cupones para medicamentos de Airline pilot. Los precios aqu no tienen en cuenta lo que podra costar con la ayuda del seguro (puede ser  ms barato con su seguro), pero el sitio web puede darle el precio si no utiliz Research scientist (physical sciences).  - Puede imprimir el cupn correspondiente y llevarlo con su receta a la farmacia.  - Tambin puede pasar por nuestra oficina durante el horario de atencin regular y Charity fundraiser una tarjeta de cupones de GoodRx.  - Si necesita que su receta se enve electrnicamente a una farmacia diferente, informe a nuestra oficina a travs de MyChart de [place] o por telfono llamando al [phone] y presione la opcin 4.

## 2022-06-16 NOTE — Progress Notes (Signed)
   Follow-Up Visit   Subjective  Sara Randolph is a 61 y.o. female who presents for the following: Follow-up (2 month recheck. ISK's. Torso. Tx with LN2 at last visit).  They have cleared up.  No symptoms.  The patient has spots, moles and lesions to be evaluated, some may be new or changing and the patient has concerns that these could be cancer. She has an itchy spot on her trunk that bothers her.  She also developed a rash under her arms after shaving and then traveling to [place], where the temps were in the 100s.  Very itchy.  A little better now but still there.   The following portions of the chart were reviewed this encounter and updated as appropriate:      Review of Systems: No other skin or systemic complaints except as noted in HPI or Assessment and Plan.   Objective  Well appearing patient in no apparent distress; mood and affect are within normal limits.  A focused examination was performed including torso, arms. Relevant physical exam findings are noted in the Assessment and Plan.  ISK sites at inframammary area and L mid back are clear with residual hyperpigmentation c/w PIH.  Left Lower Back 2.38m pink flesh papule     Axillae Pink patches at B/L axilla   Assessment & Plan  Neoplasm of skin Left Lower Back  Epidermal / dermal shaving  Lesion diameter (cm):  0.3 Informed consent: discussed and consent obtained   Patient was prepped and draped in usual sterile fashion: Area prepped with alcohol. Anesthesia: the lesion was anesthetized in a standard fashion   Anesthetic:  1% lidocaine w/ epinephrine 1-100,000 buffered w/ 8.4% NaHCO3 Instrument used: flexible razor blade   Hemostasis achieved with: pressure, aluminum chloride and electrodesiccation   Outcome: patient tolerated procedure well   Post-procedure details: wound care instructions given   Post-procedure details comment:  Ointment and small bandage applied  Specimen 1 - Surgical  pathology Differential Diagnosis: Irritated nevus, R/O atypia  Check Margins: No  Intertrigo Axillae  Vrs irritant dermatitis.  Start Eucrisa ointment twice daily until itchy rash cleared and as needed for rash/itching  Intertrigo is a chronic recurrent rash that occurs in skin fold areas that may be associated with friction; heat; moisture; yeast; fungus; and bacteria.  It is exacerbated by increased movement / activity; sweating; and higher atmospheric temperature.   Crisaborole (EUCRISA) 2 % OINT - Axillae Apply twice daily to affected areas as needed for rash/itching   Seborrheic Keratoses. Back - Stuck-on, waxy, tan-brown papules.  Previously treated ISKs have resolved, and are no longer symptomatic. - Benign-appearing - Discussed benign etiology and prognosis. - Observe - Call for any changes  Lentigines. Back. - Scattered tan macules - Due to sun exposure - Benign-appearing, observe - Recommend daily broad spectrum sunscreen SPF 30+ to sun-exposed areas, reapply every 2 hours as needed. - Call for any changes   Return in about 10 months (around 04/17/2023) for TBSE.  I, JEmelia Salisbury CMA, am acting as scribe for TBrendolyn Patty MD.  Documentation: I have reviewed the above documentation for accuracy and completeness, and I agree with the above.  TBrendolyn PattyMD

## 2022-06-17 ENCOUNTER — Other Ambulatory Visit: Payer: Self-pay | Admitting: Family Medicine

## 2022-06-17 DIAGNOSIS — Z1231 Encounter for screening mammogram for malignant neoplasm of breast: Secondary | ICD-10-CM

## 2022-06-22 ENCOUNTER — Telehealth: Payer: Self-pay

## 2022-06-22 NOTE — Telephone Encounter (Signed)
-----   Message from Brendolyn Patty, MD sent at 06/21/2022  8:43 PM EST ----- Skin , left lower back NEUROFIBROMA, IRRITATED  Benign - please call patient

## 2022-06-22 NOTE — Telephone Encounter (Signed)
Left pt message to call for bx result/sh 

## 2022-06-28 NOTE — Telephone Encounter (Signed)
Advised pt of bx result/sh ?

## 2022-07-20 ENCOUNTER — Ambulatory Visit
Admission: RE | Admit: 2022-07-20 | Discharge: 2022-07-20 | Disposition: A | Discharge status: Home / Self Care | Payer: Federal, State, Local not specified - PPO | Source: Ambulatory Visit | Attending: Family Medicine | Admitting: Family Medicine

## 2022-07-20 DIAGNOSIS — Z1231 Encounter for screening mammogram for malignant neoplasm of breast: Secondary | ICD-10-CM | POA: Insufficient documentation

## 2022-07-21 ENCOUNTER — Other Ambulatory Visit: Payer: Self-pay

## 2022-07-21 ENCOUNTER — Other Ambulatory Visit: Payer: Self-pay | Admitting: Family Medicine

## 2022-07-21 DIAGNOSIS — N63 Unspecified lump in unspecified breast: Secondary | ICD-10-CM

## 2022-07-21 DIAGNOSIS — R928 Other abnormal and inconclusive findings on diagnostic imaging of breast: Secondary | ICD-10-CM

## 2022-07-25 ENCOUNTER — Other Ambulatory Visit: Payer: Self-pay | Admitting: Family Medicine

## 2022-07-25 DIAGNOSIS — E559 Vitamin D deficiency, unspecified: Secondary | ICD-10-CM

## 2022-07-28 ENCOUNTER — Ambulatory Visit
Admission: RE | Admit: 2022-07-28 | Discharge: 2022-07-28 | Disposition: A | Discharge status: Home / Self Care | Payer: Federal, State, Local not specified - PPO | Source: Ambulatory Visit | Attending: Family Medicine | Admitting: Family Medicine

## 2022-07-28 DIAGNOSIS — N63 Unspecified lump in unspecified breast: Secondary | ICD-10-CM

## 2022-07-28 DIAGNOSIS — R928 Other abnormal and inconclusive findings on diagnostic imaging of breast: Secondary | ICD-10-CM | POA: Diagnosis not present

## 2022-07-28 DIAGNOSIS — N6002 Solitary cyst of left breast: Secondary | ICD-10-CM | POA: Diagnosis not present

## 2022-07-28 DIAGNOSIS — R922 Inconclusive mammogram: Secondary | ICD-10-CM | POA: Diagnosis not present

## 2022-08-04 ENCOUNTER — Other Ambulatory Visit: Payer: Self-pay | Admitting: Family Medicine

## 2022-08-04 DIAGNOSIS — G4700 Insomnia, unspecified: Secondary | ICD-10-CM

## 2022-08-04 DIAGNOSIS — K2100 Gastro-esophageal reflux disease with esophagitis, without bleeding: Secondary | ICD-10-CM

## 2022-08-04 DIAGNOSIS — E785 Hyperlipidemia, unspecified: Secondary | ICD-10-CM

## 2022-08-04 NOTE — Telephone Encounter (Signed)
Requested Prescriptions  Pending Prescriptions Disp Refills   ezetimibe (ZETIA) 10 MG tablet [Pharmacy Med Name: EZETIMIBE 10 MG TABLET] 90 tablet 0    Sig: TAKE 1 TABLET BY MOUTH EVERY DAY     Cardiovascular:  Antilipid - Sterol Transport Inhibitors Failed - 08/04/2022  1:33 AM      Failed - Lipid Panel in normal range within the last 12 months    Cholesterol, Total  Date Value Ref Range Status  04/01/2015 171 100 - 199 mg/dL Final   Cholesterol  Date Value Ref Range Status  02/08/2022 187 <200 mg/dL Final   LDL Cholesterol (Calc)  Date Value Ref Range Status  02/08/2022 117 (H) mg/dL (calc) Final    Comment:    Reference range: <100 . Desirable range <100 mg/dL for primary prevention;   <70 mg/dL for patients with CHD or diabetic patients  with > or = 2 CHD risk factors. Marland Kitchen LDL-C is now calculated using the Martin-Hopkins  calculation, which is a validated novel method providing  better accuracy than the Friedewald equation in the  estimation of LDL-C.  Cresenciano Genre et al. Annamaria Helling. 7564;332(95): 2061-2068  (http://education.[facility].com/faq/FAQ164)    HDL  Date Value Ref Range Status  02/08/2022 45 (L) > OR = 50 mg/dL Final  04/01/2015 56 >39 mg/dL Final    Comment:    According to ATP-III Guidelines, HDL-C >59 mg/dL is considered a negative risk factor for CHD.    Triglycerides  Date Value Ref Range Status  02/08/2022 136 <150 mg/dL Final         Passed - AST in normal range and within 360 days    AST  Date Value Ref Range Status  08/10/2021 17 10 - 35 U/L Final         Passed - ALT in normal range and within 360 days    ALT  Date Value Ref Range Status  08/10/2021 16 6 - 29 U/L Final         Passed - Patient is not pregnant      Passed - Valid encounter within last 12 months    Recent Outpatient Visits           5 months ago Perennial allergic rhinitis   [facility] Rory Percy M, DO   8 months ago Pre-diabetes   [facility] Steele Sizer, MD   11 months ago Pre-diabetes   [facility] Steele Sizer, MD   11 months ago [facility] Steele Sizer, MD   1 year ago [facility] Steele Sizer, MD       Future Appointments             In 1 week Steele Sizer, MD [facility]             omeprazole (PRILOSEC) 40 MG capsule [Pharmacy Med Name: OMEPRAZOLE DR 40 MG CAPSULE] 90 capsule 0    Sig: TAKE 1 CAPSULE (40 MG TOTAL) BY MOUTH DAILY.     Gastroenterology: Proton Pump Inhibitors Passed - 08/04/2022  1:33 AM      Passed - Valid encounter within last 12 months    Recent Outpatient Visits           5 months ago Perennial allergic rhinitis   [place], DO   8 months ago Pre-diabetes   [facility] [place],  MD   11 months ago Pre-diabetes   [facility] Steele Sizer, MD   11 months ago [facility] Steele Sizer, MD   1 year ago [facility] Steele Sizer, MD       Future Appointments             In 1 week Steele Sizer, MD [facility]             traZODone (DESYREL) 50 MG tablet [Pharmacy Med Name: TRAZODONE 50 MG TABLET] 90 tablet 0    Sig: TAKE 0.5-1 TABLET BY MOUTH AT BEDTIME AS NEEDED FOR SLEEP.     Psychiatry: Antidepressants - Serotonin Modulator Passed - 08/04/2022  1:33 AM      Passed - Valid encounter within last 6 months    Recent Outpatient Visits           5 months ago Perennial allergic rhinitis   [facility] Myles Gip, DO   8 months ago Pre-diabetes   [facility] Steele Sizer, MD   11 months ago Pre-diabetes   [facility] Steele Sizer, MD   11 months ago [facility] Steele Sizer, MD   1 year ago [facility] Steele Sizer, MD       Future Appointments             In 1 week Steele Sizer, MD [facility], [facility]

## 2022-08-10 ENCOUNTER — Other Ambulatory Visit: Payer: Self-pay | Admitting: Family Medicine

## 2022-08-10 DIAGNOSIS — R739 Hyperglycemia, unspecified: Secondary | ICD-10-CM

## 2022-08-10 DIAGNOSIS — R7303 Prediabetes: Secondary | ICD-10-CM

## 2022-08-10 NOTE — Progress Notes (Unsigned)
Name: Sara Randolph   MRN: [id number]    DOB: Jan 06, 1961   Date:08/10/2022       Progress Note  Subjective  Chief Complaint  Follow Up  HPI  Dysthymia/Insomnia: seen in Dec for marital problems, she was  feeling anxious and also having insomnia, we started her on Lexapro and also Trazodone. She states sleeping is better, able to fall asleep and is sleeping through the night.  She states medication has mellowed her out, not reacting as quickly . She states she spoke to him about wanting to spend more time with him. Initially he was defensive but afterwards they spoke and they are both trying to stop the bickering cycle. They are also spending more time together, working in the yard together adding a date night once a week.   B12 and Vitamin D deficiency: last levels were normal, continue supplements    Hyperlipidemia:she has noticed muscle aches and fatigue with statin therapy, she is now on Zetia and last LDL was up to 161 from 125 while taking statin therapy. She is now losing weight and eating healthier    Pre-diabetes: she is on a low sugar diet, denies polyphagia, polydipsia or polyuria. She was on [place] from April 1st, 2021 to Dec 2021, weight was down to 136 lbs but gained a little weight over the holidays . She started on Ozempic January 2023. She states initially indigestion, followed by constipation and also nausea. She has lost 17 lbs. She states side effects have subsided over the past month , she is currently giving herself 1 mg , she states got confused, therefore we will adjust dose to 1 mg weekly . She still craves sweets but trying to avoid it    GERD: she states her GERD symptoms are stable on Omeprazole 40 mg daily , Ozempic made her feel bloated and nauseated but improved now    Low back pain with radiculitis: going on for over one year, she has neurosurgeon Dr. Delice Lesch , she had a normal MRI in 2020 - unable to see the results. She states no recent flares, she takes  Gabapentin prn when she notices right lower extremity paresthesia. She also takes Meloxicam prn . Currently not taking medication    Lung nodule: seen by Dr. Raul Del in the past and last CT was stable and released back in 2015 . Unchanged   Perennial AR: she states currently on Xyzal and doing well, usually she takes loratadine also   Patient Active Problem List   Diagnosis Date Noted   Overweight 02/08/2022   Impingement syndrome of right shoulder region 11/06/2020   Statin myopathy 08/08/2020   Hypercholesteremia 08/08/2020   B12 deficiency 08/08/2020   Prediabetes 08/08/2020   Lumbar back pain with radiculopathy affecting right lower extremity 08/31/2018   Vitamin D deficiency 02/24/2017   BPPV (benign paroxysmal positional vertigo), unspecified laterality 08/25/2016   Peripheral vertigo 01/13/2015   Dyslipidemia 01/13/2015   Cold sore 01/13/2015   Gastric reflux 01/13/2015   Perennial allergic rhinitis 01/13/2015   Menopausal symptom 01/13/2015   History of concussion 01/18/2013    Past Surgical History:  Procedure Laterality Date   ABDOMINAL HYSTERECTOMY  1988   BREAST BIOPSY Right 07/04/2020   stereo bx, x-clip, FATTY BREAST TISSUE WITH LIMITED   CHOLECYSTECTOMY     DILATION AND CURETTAGE OF UTERUS     [place]   SALPINGECTOMY     TONSILLECTOMY  1969  WRIST SURGERY  1983    Family History  Problem Relation Age of Onset   Alzheimer's disease Mother    Parkinsonism Father    Heart disease Brother    Heart attack Brother    Depression Sister    Osteoporosis Sister    Breast cancer Neg Hx     Social History   Tobacco Use   Smoking status: Never   Smokeless tobacco: Never  Substance Use Topics   Alcohol use: Yes    Alcohol/week: 0.0 standard drinks of alcohol    Comment: Consumes alcohol on Saturday     Current Outpatient Medications:    albuterol (VENTOLIN HFA) 108 (90 Base) MCG/ACT inhaler, Inhale 2 puffs into  the lungs every 6 (six) hours as needed for wheezing or shortness of breath., Disp: 8 g, Rfl: 0   Crisaborole (EUCRISA) 2 % OINT, Apply twice daily to affected areas as needed for rash/itching, Disp: 60 g, Rfl: 1   escitalopram (LEXAPRO) 10 MG tablet, TAKE 1 TABLET BY MOUTH IN THE MORNING, Disp: 90 tablet, Rfl: 1   ezetimibe (ZETIA) 10 MG tablet, TAKE 1 TABLET BY MOUTH EVERY DAY, Disp: 90 tablet, Rfl: 0   levocetirizine (XYZAL) 5 MG tablet, Take 2.5 mg by mouth every evening. , Disp: , Rfl:    meloxicam (MOBIC) 15 MG tablet, Take 1 tablet (15 mg total) by mouth daily., Disp: 30 tablet, Rfl: 1   Multiple Vitamin (MULTIVITAMIN) tablet, Take 1 tablet by mouth daily., Disp: , Rfl:    neomycin-polymyxin b-dexamethasone (MAXITROL) 3.5-10000-0.1 SUSP, Place 1 drop into the right eye 4 (four) times daily., Disp: , Rfl:    omeprazole (PRILOSEC) 40 MG capsule, TAKE 1 CAPSULE (40 MG TOTAL) BY MOUTH DAILY., Disp: 90 capsule, Rfl: 0   Semaglutide, 1 MG/DOSE, 4 MG/3ML SOPN, Inject 1 mg as directed once a week., Disp: 9 mL, Rfl: 1   traZODone (DESYREL) 50 MG tablet, TAKE 0.5-1 TABLET BY MOUTH AT BEDTIME AS NEEDED FOR SLEEP., Disp: 90 tablet, Rfl: 0   valACYclovir (VALTREX) 1000 MG tablet, Take 1 tablet by mouth twice daily, Disp: 30 tablet, Rfl: 0   Vitamin D, Ergocalciferol, (DRISDOL) 1.25 MG (50000 UNIT) CAPS capsule, Take 1 capsule by mouth once a week, Disp: 12 capsule, Rfl: 0   XYZAL ALLERGY 24HR CHILDRENS 2.5 MG/5ML solution, , Disp: , Rfl:   Allergies  Allergen Reactions   Crestor [Rosuvastatin]     Cause muscle cramps, dizziness, headache and overall fatigue.   Atorvastatin     Muscle ache   Pravastatin Itching    dizziness   Venlafaxine Other (See Comments)    dizziness   Hydrocodone Nausea Only    I personally reviewed active problem list, medication list, allergies, family history, social history, health maintenance with the patient/caregiver today.   ROS  ***  Objective  There were  no vitals filed for this visit.  There is no height or weight on file to calculate BMI.  Physical Exam ***  No results found for this or any previous visit (from the past 2160 hour(s)).   PHQ2/9:    02/08/2022    8:57 AM 11/24/2021    9:36 AM 08/26/2021    9:32 AM 08/10/2021    9:48 AM 07/14/2021    1:39 PM  Depression screen PHQ 2/9  Decreased Interest 0 0 0 0 0  Down, Depressed, Hopeless 0 0 0 0 1  PHQ - 2 Score 0 0 0 0 1  Altered sleeping 0 0  0 1 1  Tired, decreased energy 0 0 0 1 0  Change in appetite 0 0 0 0 1  Feeling bad or failure about yourself  0 0 0 0 0  Trouble concentrating 0 0 0 0 0  Moving slowly or fidgety/restless 0 0 0 0 0  Suicidal thoughts 0 0 0 0 0  PHQ-9 Score 0 0 0 2 3  Difficult doing work/chores    Not difficult at all     phq 9 is {gen pos [id number]}   Fall Risk:    02/08/2022    8:57 AM 11/24/2021    9:36 AM 08/26/2021    9:31 AM 08/10/2021    9:47 AM 07/14/2021    1:30 PM  Fall Risk   Falls in the past year? 0 0 0 0 0  Number falls in past yr:  0 0 0 0  Injury with Fall?  0 0 0 0  Risk for fall due to : No Fall Risks No Fall Risks No Fall Risks No Fall Risks No Fall Risks  Follow up Falls prevention discussed;Education provided;Falls evaluation completed Falls prevention discussed Falls prevention discussed Falls prevention discussed Falls prevention discussed      Functional Status Survey:      Assessment & Plan  *** There are no diagnoses linked to this encounter.

## 2022-08-10 NOTE — Telephone Encounter (Signed)
Requested medication (s) are due for refill today: yes  Requested medication (s) are on the active medication list: yes  Last refill:  02/08/22 9 ml 1 RF  Future visit scheduled: yes- 08/11/22   Notes to clinic:  overdue lab work    Requested Prescriptions  Pending Prescriptions Disp Refills   [place], 1 MG/DOSE, 4 MG/3ML SOPN [Pharmacy Med Name: OZEMPIC 4 MG/3 ML (1 MG/DOSE)]  1    Sig: INJECT 1 MG ONCE A WEEK AS DIRECTED     Endocrinology:  Diabetes - GLP-1 Receptor Agonists - semaglutide Failed - 08/10/2022 10:32 AM      Failed - HBA1C in normal range and within 180 days    Hgb A1c MFr Bld  Date Value Ref Range Status  08/10/2021 5.8 (H) <5.7 % of total Hgb Final    Comment:    For someone without known diabetes, a hemoglobin  A1c value between 5.7% and 6.4% is consistent with prediabetes and should be confirmed with a  follow-up test. . For someone with known diabetes, a value <7% indicates that their diabetes is well controlled. A1c targets should be individualized based on duration of diabetes, age, comorbid conditions, and other considerations. . This assay result is consistent with an increased risk of diabetes. . Currently, no consensus exists regarding use of hemoglobin A1c for diagnosis of diabetes for children. .          Failed - Valid encounter within last 6 months    Recent Outpatient Visits           6 months ago Perennial allergic rhinitis   [place], DO   8 months ago Pre-diabetes   [facility] Steele Sizer, MD   11 months ago Pre-diabetes   [facility] Steele Sizer, MD   1 year ago [facility] Steele Sizer, MD   1 year ago [facility] Steele Sizer, MD       Future Appointments             Tomorrow Steele Sizer, MD [place] in  normal range and within 360 days    Creat  Date Value Ref Range Status  02/08/2022 0.91 0.50 - 1.05 mg/dL Final

## 2022-08-11 ENCOUNTER — Encounter: Payer: Self-pay | Admitting: Family Medicine

## 2022-08-11 ENCOUNTER — Ambulatory Visit: Payer: Federal, State, Local not specified - PPO | Admitting: Family Medicine

## 2022-08-11 VITALS — BP 110/70 | HR 83 | Temp 98.2°F | Resp 14 | Ht 62.0 in | Wt 127.7 lb

## 2022-08-11 DIAGNOSIS — R7303 Prediabetes: Secondary | ICD-10-CM

## 2022-08-11 DIAGNOSIS — E785 Hyperlipidemia, unspecified: Secondary | ICD-10-CM

## 2022-08-11 DIAGNOSIS — B001 Herpesviral vesicular dermatitis: Secondary | ICD-10-CM

## 2022-08-11 DIAGNOSIS — K2100 Gastro-esophageal reflux disease with esophagitis, without bleeding: Secondary | ICD-10-CM | POA: Diagnosis not present

## 2022-08-11 DIAGNOSIS — Z8639 Personal history of other endocrine, nutritional and metabolic disease: Secondary | ICD-10-CM

## 2022-08-11 DIAGNOSIS — E559 Vitamin D deficiency, unspecified: Secondary | ICD-10-CM

## 2022-08-11 DIAGNOSIS — G4700 Insomnia, unspecified: Secondary | ICD-10-CM

## 2022-08-11 DIAGNOSIS — E538 Deficiency of other specified B group vitamins: Secondary | ICD-10-CM

## 2022-08-11 DIAGNOSIS — J3089 Other allergic rhinitis: Secondary | ICD-10-CM

## 2022-08-11 MED ORDER — VALACYCLOVIR HCL 1 G PO TABS: 1000.0000 mg | ORAL_TABLET | Freq: Two times a day (BID) | ORAL | 0 refills | Status: DC

## 2022-08-11 MED ORDER — VITAMIN D (ERGOCALCIFEROL) 1.25 MG (50000 UNIT) PO CAPS: 50000.0000 [IU] | ORAL_CAPSULE | ORAL | 0 refills | Status: DC

## 2022-08-11 MED ORDER — SEMAGLUTIDE-WEIGHT MANAGEMENT 1 MG/0.5ML ~~LOC~~ SOAJ: 1.0000 mg | SUBCUTANEOUS | 0 refills | Status: AC

## 2022-08-11 MED ORDER — TRAZODONE HCL 50 MG PO TABS: ORAL_TABLET | ORAL | 0 refills | Status: DC

## 2022-08-11 MED ORDER — OMEPRAZOLE 40 MG PO CPDR: 40.0000 mg | DELAYED_RELEASE_CAPSULE | Freq: Every day | ORAL | 1 refills | Status: DC

## 2022-08-11 MED ORDER — EZETIMIBE 10 MG PO TABS: 10.0000 mg | ORAL_TABLET | Freq: Every day | ORAL | 0 refills | Status: DC

## 2022-08-12 ENCOUNTER — Other Ambulatory Visit: Payer: Self-pay | Admitting: Family Medicine

## 2022-08-12 DIAGNOSIS — Z8639 Personal history of other endocrine, nutritional and metabolic disease: Secondary | ICD-10-CM

## 2022-08-13 NOTE — Telephone Encounter (Signed)
Requested medications are due for refill today.  See pharmacy note  Requested medications are on the active medications list.  Yes - there is a future Rx there  Last refill. Unsure  Future visit scheduled.   yes  Notes to clinic.  Pharmacy comment: Alternative Requested:NEEDS A PRIOR AUTHORIZATION.     Requested Prescriptions  Pending Prescriptions Disp Refills   [place] 1 MG/0.5ML SOAJ [Pharmacy Med Name: WEGOVY 1 MG/0.5 ML PEN]  0    Sig: INJECT 1 MG INTO THE SKIN ONCE A WEEK FOR 28 DAYS.     Endocrinology:  Diabetes - GLP-1 Receptor Agonists - semaglutide Failed - 08/12/2022  4:34 PM      Failed - HBA1C in normal range and within 180 days    Hgb A1c MFr Bld  Date Value Ref Range Status  08/10/2021 5.8 (H) <5.7 % of total Hgb Final    Comment:    For someone without known diabetes, a hemoglobin  A1c value between 5.7% and 6.4% is consistent with prediabetes and should be confirmed with a  follow-up test. . For someone with known diabetes, a value <7% indicates that their diabetes is well controlled. A1c targets should be individualized based on duration of diabetes, age, comorbid conditions, and other considerations. . This assay result is consistent with an increased risk of diabetes. . Currently, no consensus exists regarding use of hemoglobin A1c for diagnosis of diabetes for children. .          Passed - Cr in normal range and within 360 days    Creat  Date Value Ref Range Status  02/08/2022 0.91 0.50 - 1.05 mg/dL Final         Passed - Valid encounter within last 6 months    Recent Outpatient Visits           2 days ago Insomnia, unspecified type   [facility] Steele Sizer, MD   6 months ago Perennial allergic rhinitis   [facility] Rory Percy M, DO   8 months ago Pre-diabetes   [facility] Steele Sizer, MD   11 months ago Pre-diabetes   [facility] Steele Sizer, MD   1 year ago [facility] Steele Sizer, MD       Future Appointments             In 2 months Steele Sizer, MD [facility]

## 2022-08-18 ENCOUNTER — Encounter: Payer: Self-pay | Admitting: Family Medicine

## 2022-08-19 ENCOUNTER — Encounter: Payer: Self-pay | Admitting: Family Medicine

## 2022-08-19 ENCOUNTER — Other Ambulatory Visit: Payer: Self-pay | Admitting: Family Medicine

## 2022-08-19 DIAGNOSIS — B001 Herpesviral vesicular dermatitis: Secondary | ICD-10-CM

## 2022-11-09 NOTE — Progress Notes (Unsigned)
Name: Sara Randolph   MRN: [id number]    DOB: [birth year]   Date:11/09/2022       Progress Note  Subjective  Chief Complaint  Follow Up  HPI  Dysthymia/Insomnia: seen in Dec for marital problems, she was  feeling anxious and also having insomnia, we started her on Lexapro and also Trazodone. She states sleeping is better, able to fall asleep and is sleeping through the night.  She states medication has mellowed her out, not reacting as quickly . She states she spoke to him about wanting to spend more time with him. Initially he was defensive but afterwards they spoke and they are both trying to stop the bickering cycle. They are also spending more time together, working in the yard together adding a date night once a week. She was able to stop Lexapro September 2023 but is still taking Trazodone   B12 and Vitamin D deficiency: last levels were normal, continue supplements    Hyperlipidemia:she has noticed muscle aches and fatigue with statin therapy, she is now on Zetia and last LDL was 117  . She is now losing weight and eating healthier    History of Obesity and pre-diabetes:  BMI was 30.36 and weight was 166 lbs January 2023, she was already eating healthy and exercising and we started her on Ozempic since she was unable to get her BMI below 30 on her own. She responded well to the medications and is currently taking 1 mg dose , her weight today is 127.7 lbs but she is worries that insurance will no longer cover Ozempic and she will gain it all back. She has associated GERD, dyslipidemia and low back pain, keeping her weight at goal has improved all of those symptoms and it is important to keep her weight down. We will try changing to [facility] and try [place] if needed.    GERD: she states her GERD symptoms are stable on Omeprazole 40 mg daily , Ozempic made her feel bloated and nauseated but that has resolved    Low back pain with radiculitis: going on for over one year, she has neurosurgeon Dr.  Meredith Mody , she had a normal MRI in 2020 - unable to see the results. She states no recent flares, she takes Gabapentin prn when she notices right lower extremity paresthesia. She is doing better since weight loss  Lung nodule: seen by Dr. Meredeth Ide in the past and last CT was stable and released back in 2015 . Unchanged   Perennial AR: she states currently on Xyzal and doing well, usually she takes loratadine   Patient Active Problem List   Diagnosis Date Noted   Overweight 02/08/2022   Impingement syndrome of right shoulder region 11/06/2020   Statin myopathy 08/08/2020   Hypercholesteremia 08/08/2020   B12 deficiency 08/08/2020   Prediabetes 08/08/2020   Lumbar back pain with radiculopathy affecting right lower extremity 08/31/2018   Vitamin D deficiency 02/24/2017   BPPV (benign paroxysmal positional vertigo), unspecified laterality 08/25/2016   Peripheral vertigo 01/13/2015   Dyslipidemia 01/13/2015   Cold sore 01/13/2015   Gastric reflux 01/13/2015   Perennial allergic rhinitis 01/13/2015   Menopausal symptom 01/13/2015   History of concussion 01/18/2013    Past Surgical History:  Procedure Laterality Date   ABDOMINAL HYSTERECTOMY  1988   BREAST BIOPSY Right 07/04/2020   stereo bx, x-clip, FATTY BREAST TISSUE WITH LIMITED   CHOLECYSTECTOMY     DILATION AND CURETTAGE OF UTERUS     ECTOPIC PREGNANCY SURGERY  RHINOPLASTY  1990   SALPINGECTOMY     TONSILLECTOMY  1969   WRIST SURGERY  1983    Family History  Problem Relation Age of Onset   Alzheimer's disease Mother    Parkinsonism Father    Heart disease Brother    Heart attack Brother    Depression Sister    Osteoporosis Sister    Breast cancer Neg Hx     Social History   Tobacco Use   Smoking status: Never   Smokeless tobacco: Never  Substance Use Topics   Alcohol use: Yes    Alcohol/week: 0.0 standard drinks of alcohol    Comment: Consumes alcohol on Saturday     Current Outpatient Medications:     albuterol (VENTOLIN HFA) 108 (90 Base) MCG/ACT inhaler, Inhale 2 puffs into the lungs every 6 (six) hours as needed for wheezing or shortness of breath., Disp: 8 g, Rfl: 0   ezetimibe (ZETIA) 10 MG tablet, Take 1 tablet (10 mg total) by mouth daily., Disp: 90 tablet, Rfl: 0   levocetirizine (XYZAL) 5 MG tablet, Take 5 mg by mouth every evening., Disp: , Rfl:    meloxicam (MOBIC) 15 MG tablet, Take 1 tablet (15 mg total) by mouth daily., Disp: 30 tablet, Rfl: 1   Multiple Vitamin (MULTIVITAMIN) tablet, Take 1 tablet by mouth daily., Disp: , Rfl:    neomycin-polymyxin b-dexamethasone (MAXITROL) 3.5-10000-0.1 SUSP, Place 1 drop into the right eye 4 (four) times daily., Disp: , Rfl:    omeprazole (PRILOSEC) 40 MG capsule, Take 1 capsule (40 mg total) by mouth daily., Disp: 90 capsule, Rfl: 1   traZODone (DESYREL) 50 MG tablet, TAKE 0.5-1 TABLET BY MOUTH AT BEDTIME AS NEEDED FOR SLEEP., Disp: 90 tablet, Rfl: 0   valACYclovir (VALTREX) 1000 MG tablet, Take 1 tablet (1,000 mg total) by mouth 2 (two) times daily., Disp: 30 tablet, Rfl: 0   Vitamin D, Ergocalciferol, (DRISDOL) 1.25 MG (50000 UNIT) CAPS capsule, Take 1 capsule (50,000 Units total) by mouth once a week., Disp: 12 capsule, Rfl: 0  Allergies  Allergen Reactions   Crestor [Rosuvastatin]     Cause muscle cramps, dizziness, headache and overall fatigue.   Atorvastatin     Muscle ache   Pravastatin Itching    dizziness   Venlafaxine Other (See Comments)    dizziness   Hydrocodone Nausea Only    I personally reviewed active problem list, medication list, allergies, family history, social history, health maintenance with the patient/caregiver today.   ROS  ***  Objective  There were no vitals filed for this visit.  There is no height or weight on file to calculate BMI.  Physical Exam ***  No results found for this or any previous visit (from the past 2160 hour(s)).   PHQ2/9:    08/11/2022    8:34 AM 02/08/2022    8:57 AM  11/24/2021    9:36 AM 08/26/2021    9:32 AM 08/10/2021    9:48 AM  Depression screen PHQ 2/9  Decreased Interest 0 0 0 0 0  Down, Depressed, Hopeless 0 0 0 0 0  PHQ - 2 Score 0 0 0 0 0  Altered sleeping 2 0 0 0 1  Tired, decreased energy 0 0 0 0 1  Change in appetite 0 0 0 0 0  Feeling bad or failure about yourself  0 0 0 0 0  Trouble concentrating 0 0 0 0 0  Moving slowly or fidgety/restless 0 0 0 0 0  Suicidal thoughts 0 0 0 0 0  PHQ-9 Score 2 0 0 0 2  Difficult doing work/chores Not difficult at all    Not difficult at all    phq 9 is {gen pos [id number]}   Fall Risk:    08/11/2022    8:33 AM 02/08/2022    8:57 AM 11/24/2021    9:36 AM 08/26/2021    9:31 AM 08/10/2021    9:47 AM  Fall Risk   Falls in the past year? 0 0 0 0 0  Number falls in past yr:   0 0 0  Injury with Fall?   0 0 0  Risk for fall due to :  No Fall Risks No Fall Risks No Fall Risks No Fall Risks  Follow up Falls prevention discussed;Education provided;Falls evaluation completed Falls prevention discussed;Education provided;Falls evaluation completed Falls prevention discussed Falls prevention discussed Falls prevention discussed      Functional Status Survey:      Assessment & Plan  *** There are no diagnoses linked to this encounter.

## 2022-11-10 ENCOUNTER — Ambulatory Visit: Payer: Federal, State, Local not specified - PPO | Admitting: Family Medicine

## 2022-11-10 ENCOUNTER — Encounter: Payer: Self-pay | Admitting: Family Medicine

## 2022-11-10 VITALS — BP 126/74 | HR 86 | Resp 16 | Ht 62.0 in | Wt 138.0 lb

## 2022-11-10 DIAGNOSIS — G4700 Insomnia, unspecified: Secondary | ICD-10-CM

## 2022-11-10 DIAGNOSIS — E785 Hyperlipidemia, unspecified: Secondary | ICD-10-CM

## 2022-11-10 DIAGNOSIS — E538 Deficiency of other specified B group vitamins: Secondary | ICD-10-CM

## 2022-11-10 DIAGNOSIS — E559 Vitamin D deficiency, unspecified: Secondary | ICD-10-CM

## 2022-11-10 DIAGNOSIS — K219 Gastro-esophageal reflux disease without esophagitis: Secondary | ICD-10-CM

## 2022-11-10 DIAGNOSIS — N951 Menopausal and female climacteric states: Secondary | ICD-10-CM

## 2022-11-10 MED ORDER — VITAMIN D (ERGOCALCIFEROL) 1.25 MG (50000 UNIT) PO CAPS: 50000.0000 [IU] | ORAL_CAPSULE | ORAL | 0 refills | Status: DC

## 2022-11-10 MED ORDER — EZETIMIBE 10 MG PO TABS: 10.0000 mg | ORAL_TABLET | Freq: Every day | ORAL | 1 refills | Status: DC

## 2022-11-10 MED ORDER — TRAZODONE HCL 50 MG PO TABS: ORAL_TABLET | ORAL | 0 refills | Status: DC

## 2022-11-10 MED ORDER — ESTRADIOL 0.0375 MG/24HR TD PTTW: 1.0000 | MEDICATED_PATCH | TRANSDERMAL | 5 refills | Status: DC

## 2023-01-29 ENCOUNTER — Other Ambulatory Visit: Payer: Self-pay | Admitting: Family Medicine

## 2023-01-29 DIAGNOSIS — G4700 Insomnia, unspecified: Secondary | ICD-10-CM

## 2023-03-31 ENCOUNTER — Telehealth: Payer: Federal, State, Local not specified - PPO | Admitting: Family Medicine

## 2023-03-31 DIAGNOSIS — Z20818 Contact with and (suspected) exposure to other bacterial communicable diseases: Secondary | ICD-10-CM | POA: Diagnosis not present

## 2023-03-31 DIAGNOSIS — J029 Acute pharyngitis, unspecified: Secondary | ICD-10-CM

## 2023-03-31 MED ORDER — AMOXICILLIN 500 MG PO TABS: 500.0000 mg | ORAL_TABLET | Freq: Two times a day (BID) | ORAL | 0 refills | Status: AC

## 2023-03-31 NOTE — Progress Notes (Signed)

## 2023-04-12 ENCOUNTER — Encounter: Payer: Federal, State, Local not specified - PPO | Admitting: Dermatology

## 2023-04-28 ENCOUNTER — Other Ambulatory Visit: Payer: Self-pay | Admitting: Family Medicine

## 2023-04-28 DIAGNOSIS — K2100 Gastro-esophageal reflux disease with esophagitis, without bleeding: Secondary | ICD-10-CM

## 2023-05-05 DIAGNOSIS — H04123 Dry eye syndrome of bilateral lacrimal glands: Secondary | ICD-10-CM | POA: Diagnosis not present

## 2023-05-05 DIAGNOSIS — H2513 Age-related nuclear cataract, bilateral: Secondary | ICD-10-CM | POA: Diagnosis not present

## 2023-05-05 DIAGNOSIS — H43813 Vitreous degeneration, bilateral: Secondary | ICD-10-CM | POA: Diagnosis not present

## 2023-05-12 ENCOUNTER — Other Ambulatory Visit: Payer: Self-pay | Admitting: Family Medicine

## 2023-05-12 DIAGNOSIS — K2100 Gastro-esophageal reflux disease with esophagitis, without bleeding: Secondary | ICD-10-CM

## 2023-05-12 NOTE — Progress Notes (Signed)
Name: Sara Randolph   MRN: [id number]    DOB: 04/10/1961   Date:05/13/2023       Progress Note  Subjective  Chief Complaint  Follow Up  HPI  Dysthymia/Insomnia: seen in Dec22  for marital problems, she was  feeling anxious and also having insomnia, we started her on Lexapro and also Trazodone. She stopped all her medications since marriage improved. She states decided to stop trazodone to see if improved her fatigue but continues to fee tired. However does not want to go back on medication at this time. She states hot flashes gets worse when she has sweets   B12 and Vitamin D deficiency: last levels were normal, continue supplements   RAD : since childhood, she uses albuterol prn    Hyperlipidemia:she has noticed muscle aches and fatigue with statin therapy, she is now on Zetia and last LDL was 117 . She was losing weight but since not on Wegovy her weight is up 11 lbs    History of Obesity and pre-diabetes:  BMI was 30.36 and weight was 166 lbs January 2023, she was already eating healthy and exercising and we started her on Ozempic since she was unable to get her BMI below 30 on her own. She responded well to the medications and is currently taking 1 mg dose , her weight today is 127.7 lbs  but insurance is no longer covering GLP-1 agonist , she initially gained 11 lbs but weight has been stable now at 139 lbs. She is now following [employer] - cooking most of her meals, eating 1000 calories per day - her weight was down to 136 lbs before going on vacation but gained it back while gone and will resume her diet now   Hot flashes/Post menopausal syndrome : much worse since she stopped GLP-1 agonist. She states if she drinks caffeine or sweets symptoms are worse. She also stopped taking Lexapro and symptoms much worse since. We gave her Vivelle-Dot and while taking it she noticed improvement of symptoms but she stopped taking due to breast tenderness and niece having breast cancer    Dyslipidemia: she tried statins and caused myopathy, took zetia for a while but currently off all medications    GERD: currently only taking Omeprazole prn  Low back pain with radiculitis: going on for over one year, she has neurosurgeon Dr. Meredith Mody , she had a normal MRI in 2020 - unable to see the results. She states no recent flares, she takes Gabapentin prn when she notices right lower extremity paresthesia. Taking Tylenol prn   Lung nodule: seen by Dr. Meredeth Ide in the past and last CT was stable and released back in 2015 . Unchanged   Perennial AR: she states currently on Xyzal and doing well. Unchanged   Patient Active Problem List   Diagnosis Date Noted   Overweight 02/08/2022   Impingement syndrome of right shoulder region 11/06/2020   Statin myopathy 08/08/2020   Hypercholesteremia 08/08/2020   B12 deficiency 08/08/2020   Prediabetes 08/08/2020   Lumbar back pain with radiculopathy affecting right lower extremity 08/31/2018   Vitamin D deficiency 02/24/2017   BPPV (benign paroxysmal positional vertigo), unspecified laterality 08/25/2016   Peripheral vertigo 01/13/2015   Dyslipidemia 01/13/2015   Cold sore 01/13/2015   Gastroesophageal reflux disease without esophagitis 01/13/2015   Perennial allergic rhinitis 01/13/2015   Menopausal symptom 01/13/2015   History of concussion 01/18/2013    Past Surgical History:  Procedure Laterality Date   ABDOMINAL HYSTERECTOMY  1988   BREAST BIOPSY Right 07/04/2020   stereo bx, x-clip, FATTY BREAST TISSUE WITH LIMITED   CHOLECYSTECTOMY     DILATION AND CURETTAGE OF UTERUS     ECTOPIC PREGNANCY SURGERY     RHINOPLASTY  1990   SALPINGECTOMY     TONSILLECTOMY  1969   WRIST SURGERY  1983    Family History  Problem Relation Age of Onset   Alzheimer's disease Mother    Parkinsonism Father    Heart disease Brother    Heart attack Brother    Depression Sister    Osteoporosis Sister    Breast cancer Neg Hx     Social History    Tobacco Use   Smoking status: Never   Smokeless tobacco: Never  Substance Use Topics   Alcohol use: Yes    Alcohol/week: 0.0 standard drinks of alcohol    Comment: Consumes alcohol on Saturday     Current Outpatient Medications:    levocetirizine (XYZAL) 5 MG tablet, Take 5 mg by mouth every evening., Disp: , Rfl:    estradiol (VIVELLE-DOT) 0.0375 MG/24HR, Place 1 patch onto the skin 2 (two) times a week. (Patient not taking: Reported on 05/13/2023), Disp: 8 patch, Rfl: 5   ezetimibe (ZETIA) 10 MG tablet, Take 1 tablet (10 mg total) by mouth daily. (Patient not taking: Reported on 05/13/2023), Disp: 90 tablet, Rfl: 1   Multiple Vitamin (MULTIVITAMIN) tablet, Take 1 tablet by mouth daily. (Patient not taking: Reported on 05/13/2023), Disp: , Rfl:    omeprazole (PRILOSEC) 40 MG capsule, TAKE 1 CAPSULE (40 MG TOTAL) BY MOUTH DAILY. (Patient not taking: Reported on 05/13/2023), Disp: 90 capsule, Rfl: 1   valACYclovir (VALTREX) 1000 MG tablet, Take 1 tablet (1,000 mg total) by mouth 2 (two) times daily. (Patient not taking: Reported on 05/13/2023), Disp: 30 tablet, Rfl: 0   Vitamin D, Ergocalciferol, (DRISDOL) 1.25 MG (50000 UNIT) CAPS capsule, Take 1 capsule (50,000 Units total) by mouth once a week., Disp: 12 capsule, Rfl: 1  Allergies  Allergen Reactions   Crestor [Rosuvastatin]     Cause muscle cramps, dizziness, headache and overall fatigue.   Atorvastatin     Muscle ache   Pravastatin Itching    dizziness   Venlafaxine Other (See Comments)    dizziness   Hydrocodone Nausea Only    I personally reviewed active problem list, medication list, allergies, family history, social history, health maintenance with the patient/caregiver today.   ROS  Ten systems reviewed and is negative except as mentioned in HPI    Objective  Vitals:   05/13/23 0835  BP: 122/68  Pulse: 72  Resp: 16  SpO2: 95%  Weight: 139 lb (63 kg)  Height: 5\' 2"  (1.575 m)    Body mass index is 25.42  kg/m.  Physical Exam  Constitutional: Patient appears well-developed and well-nourished. No distress.  HEENT: head atraumatic, normocephalic, pupils equal and reactive to light, , neck supple Cardiovascular: Normal rate, regular rhythm and normal heart sounds.  No murmur heard. No BLE edema. Pulmonary/Chest: Effort normal and breath sounds normal. No respiratory distress. Abdominal: Soft.  There is no tenderness. Psychiatric: Patient has a normal mood and affect. behavior is normal. Judgment and thought content normal.    PHQ2/9:    05/13/2023    8:36 AM 11/10/2022   11:29 AM 08/11/2022    8:34 AM 02/08/2022    8:57 AM 11/24/2021    9:36 AM  Depression screen PHQ 2/9  Decreased Interest 0 0 0  0 0  Down, Depressed, Hopeless 0 0 0 0 0  PHQ - 2 Score 0 0 0 0 0  Altered sleeping 3 0 2 0 0  Tired, decreased energy 3 0 0 0 0  Change in appetite 0 0 0 0 0  Feeling bad or failure about yourself  0 0 0 0 0  Trouble concentrating 0 0 0 0 0  Moving slowly or fidgety/restless 0 0 0 0 0  Suicidal thoughts 0 0 0 0 0  PHQ-9 Score 6 0 2 0 0  Difficult doing work/chores   Not difficult at all      phq 9 is negative   Fall Risk:    05/13/2023    8:36 AM 11/10/2022   11:29 AM 08/11/2022    8:33 AM 02/08/2022    8:57 AM 11/24/2021    9:36 AM  Fall Risk   Falls in the past year? 0 0 0 0 0  Number falls in past yr: 0 0   0  Injury with Fall? 0 0   0  Risk for fall due to : No Fall Risks No Fall Risks  No Fall Risks No Fall Risks  Follow up Falls prevention discussed Falls prevention discussed Falls prevention discussed;Education provided;Falls evaluation completed Falls prevention discussed;Education provided;Falls evaluation completed Falls prevention discussed      Functional Status Survey: Is the patient deaf or have difficulty hearing?: Yes Does the patient have difficulty seeing, even when wearing glasses/contacts?: Yes Does the patient have difficulty concentrating, remembering, or  making decisions?: No Does the patient have difficulty walking or climbing stairs?: No Does the patient have difficulty dressing or bathing?: No Does the patient have difficulty doing errands alone such as visiting a doctor's office or shopping?: No    Assessment & Plan  1. Hyperglycemia  - Hemoglobin A1c  2. Vitamin D deficiency  - VITAMIN D 25 Hydroxy (Vit-D Deficiency, Fractures) - Vitamin D, Ergocalciferol, (DRISDOL) 1.25 MG (50000 UNIT) CAPS capsule; Take 1 capsule (50,000 Units total) by mouth once a week.  Dispense: 12 capsule; Refill: 1  3. B12 deficiency  - B12 and Folate Panel  4. Dyslipidemia  - Lipid panel  5. Statin myopathy  Stopped zetia on her own and we will recheck labs   6. Gastroesophageal reflux disease without esophagitis   7. Post menopausal syndrome  She will resume the patch  8. Other fatigue  - CBC with Differential/Platelet - COMPLETE METABOLIC PANEL WITH GFR - TSH

## 2023-05-13 ENCOUNTER — Ambulatory Visit: Payer: Federal, State, Local not specified - PPO | Admitting: Family Medicine

## 2023-05-13 ENCOUNTER — Encounter: Payer: Self-pay | Admitting: Family Medicine

## 2023-05-13 VITALS — BP 122/68 | HR 72 | Resp 16 | Ht 62.0 in | Wt 139.0 lb

## 2023-05-13 DIAGNOSIS — R5383 Other fatigue: Secondary | ICD-10-CM

## 2023-05-13 DIAGNOSIS — E538 Deficiency of other specified B group vitamins: Secondary | ICD-10-CM | POA: Diagnosis not present

## 2023-05-13 DIAGNOSIS — E559 Vitamin D deficiency, unspecified: Secondary | ICD-10-CM | POA: Diagnosis not present

## 2023-05-13 DIAGNOSIS — G720 Drug-induced myopathy: Secondary | ICD-10-CM

## 2023-05-13 DIAGNOSIS — R739 Hyperglycemia, unspecified: Secondary | ICD-10-CM | POA: Diagnosis not present

## 2023-05-13 DIAGNOSIS — T466X5D Adverse effect of antihyperlipidemic and antiarteriosclerotic drugs, subsequent encounter: Secondary | ICD-10-CM

## 2023-05-13 DIAGNOSIS — E785 Hyperlipidemia, unspecified: Secondary | ICD-10-CM

## 2023-05-13 DIAGNOSIS — J4520 Mild intermittent asthma, uncomplicated: Secondary | ICD-10-CM

## 2023-05-13 DIAGNOSIS — K219 Gastro-esophageal reflux disease without esophagitis: Secondary | ICD-10-CM

## 2023-05-13 DIAGNOSIS — N951 Menopausal and female climacteric states: Secondary | ICD-10-CM

## 2023-05-13 MED ORDER — VITAMIN D (ERGOCALCIFEROL) 1.25 MG (50000 UNIT) PO CAPS: 50000.0000 [IU] | ORAL_CAPSULE | ORAL | 1 refills | Status: DC

## 2023-05-13 MED ORDER — ALBUTEROL SULFATE HFA 108 (90 BASE) MCG/ACT IN AERS
2.0000 | INHALATION_SPRAY | Freq: Four times a day (QID) | RESPIRATORY_TRACT | 0 refills | Status: DC | PRN
Start: 2023-05-13 — End: 2023-11-11

## 2023-05-13 MED ORDER — VITAMIN D (ERGOCALCIFEROL) 1.25 MG (50000 UNIT) PO CAPS
50000.0000 [IU] | ORAL_CAPSULE | ORAL | 1 refills | Status: DC
Start: 2023-05-13 — End: 2023-11-11

## 2023-05-13 NOTE — Patient Instructions (Signed)
RSV and flu at local pharmacy

## 2023-05-14 LAB — HEMOGLOBIN A1C
Hgb A1c MFr Bld: 5.6 %{Hb} (ref <5.7)
Mean Plasma Glucose: 114 mg/dL
eAG (mmol/L): 6.3 mmol/L

## 2023-05-14 LAB — LIPID PANEL
Cholesterol: 298 mg/dL — ABNORMAL HIGH (ref <200)
HDL: 54 mg/dL (ref >=50)
LDL Cholesterol (Calc): 210 mg/dL — ABNORMAL HIGH
Non-HDL Cholesterol (Calc): 244 mg/dL — ABNORMAL HIGH (ref <130)
Total CHOL/HDL Ratio: 5.5 (calc) — ABNORMAL HIGH (ref <5.0)
Triglycerides: 171 mg/dL — ABNORMAL HIGH (ref <150)

## 2023-05-14 LAB — COMPLETE METABOLIC PANEL WITH GFR
AG Ratio: 1.9 (calc) (ref 1.0–2.5)
ALT: 16 U/L (ref 6–29)
AST: 16 U/L (ref 10–35)
Albumin: 4.8 g/dL (ref 3.6–5.1)
Alkaline phosphatase (APISO): 59 U/L (ref 37–153)
BUN: 16 mg/dL (ref 7–25)
CO2: 28 mmol/L (ref 20–32)
Calcium: 10 mg/dL (ref 8.6–10.4)
Chloride: 104 mmol/L (ref 98–110)
Creat: 0.73 mg/dL (ref 0.50–1.05)
Globulin: 2.5 g/dL (ref 1.9–3.7)
Glucose, Bld: 95 mg/dL (ref 65–99)
Potassium: 4.6 mmol/L (ref 3.5–5.3)
Sodium: 142 mmol/L (ref 135–146)
Total Bilirubin: 0.5 mg/dL (ref 0.2–1.2)
Total Protein: 7.3 g/dL (ref 6.1–8.1)
eGFR: 92 mL/min/{1.73_m2} (ref >=60)

## 2023-05-14 LAB — CBC WITH DIFFERENTIAL/PLATELET
Absolute Monocytes: 313 {cells}/uL (ref 200–950)
Basophils Absolute: 38 {cells}/uL (ref 0–200)
Basophils Relative: 0.7 %
Eosinophils Absolute: 92 {cells}/uL (ref 15–500)
Eosinophils Relative: 1.7 %
HCT: 45.1 % — ABNORMAL HIGH (ref 35.0–45.0)
Hemoglobin: 14.7 g/dL (ref 11.7–15.5)
Lymphs Abs: 1571 {cells}/uL (ref 850–3900)
MCH: 29.5 pg (ref 27.0–33.0)
MCHC: 32.6 g/dL (ref 32.0–36.0)
MCV: 90.6 fL (ref 80.0–100.0)
MPV: 10.5 fL (ref 7.5–12.5)
Monocytes Relative: 5.8 %
Neutro Abs: 3386 {cells}/uL (ref 1500–7800)
Neutrophils Relative %: 62.7 %
Platelets: 300 10*3/uL (ref 140–400)
RBC: 4.98 10*6/uL (ref 3.80–5.10)
RDW: 12 % (ref 11.0–15.0)
Total Lymphocyte: 29.1 %
WBC: 5.4 10*3/uL (ref 3.8–10.8)

## 2023-05-14 LAB — VITAMIN D 25 HYDROXY (VIT D DEFICIENCY, FRACTURES): Vit D, 25-Hydroxy: 63 ng/mL (ref 30–100)

## 2023-05-14 LAB — B12 AND FOLATE PANEL
Folate: >24 ng/mL
Vitamin B-12: 871 pg/mL (ref 200–1100)

## 2023-05-14 LAB — TSH: TSH: 2.08 m[IU]/L (ref 0.40–4.50)

## 2023-05-16 ENCOUNTER — Encounter: Payer: Self-pay | Admitting: Family Medicine

## 2023-05-23 DIAGNOSIS — H2511 Age-related nuclear cataract, right eye: Secondary | ICD-10-CM | POA: Diagnosis not present

## 2023-05-23 DIAGNOSIS — H2512 Age-related nuclear cataract, left eye: Secondary | ICD-10-CM | POA: Diagnosis not present

## 2023-05-24 ENCOUNTER — Encounter: Payer: Self-pay | Admitting: Ophthalmology

## 2023-05-28 ENCOUNTER — Other Ambulatory Visit: Payer: Self-pay | Admitting: Medical Genetics

## 2023-05-28 DIAGNOSIS — Z006 Encounter for examination for normal comparison and control in clinical research program: Secondary | ICD-10-CM

## 2023-05-30 NOTE — Discharge Instructions (Signed)

## 2023-06-01 ENCOUNTER — Encounter: Payer: Self-pay | Admitting: Ophthalmology

## 2023-06-01 ENCOUNTER — Ambulatory Visit
Admission: RE | Admit: 2023-06-01 | Discharge: 2023-06-01 | Disposition: A | Discharge status: Home / Self Care | Payer: Federal, State, Local not specified - PPO | Attending: Ophthalmology | Admitting: Ophthalmology

## 2023-06-01 ENCOUNTER — Ambulatory Visit: Payer: Federal, State, Local not specified - PPO | Admitting: Anesthesiology

## 2023-06-01 ENCOUNTER — Other Ambulatory Visit: Payer: Self-pay

## 2023-06-01 ENCOUNTER — Encounter
Admission: RE | Disposition: A | Discharge status: Home / Self Care | Payer: Self-pay | Source: Home / Self Care | Attending: Ophthalmology

## 2023-06-01 DIAGNOSIS — E7800 Pure hypercholesterolemia, unspecified: Secondary | ICD-10-CM | POA: Insufficient documentation

## 2023-06-01 DIAGNOSIS — F419 Anxiety disorder, unspecified: Secondary | ICD-10-CM | POA: Diagnosis not present

## 2023-06-01 DIAGNOSIS — H2511 Age-related nuclear cataract, right eye: Secondary | ICD-10-CM | POA: Insufficient documentation

## 2023-06-01 DIAGNOSIS — K219 Gastro-esophageal reflux disease without esophagitis: Secondary | ICD-10-CM | POA: Diagnosis not present

## 2023-06-01 DIAGNOSIS — H269 Unspecified cataract: Secondary | ICD-10-CM | POA: Diagnosis not present

## 2023-06-01 DIAGNOSIS — J45909 Unspecified asthma, uncomplicated: Secondary | ICD-10-CM | POA: Insufficient documentation

## 2023-06-01 DIAGNOSIS — M1990 Unspecified osteoarthritis, unspecified site: Secondary | ICD-10-CM | POA: Insufficient documentation

## 2023-06-01 DIAGNOSIS — Z79899 Other long term (current) drug therapy: Secondary | ICD-10-CM | POA: Diagnosis not present

## 2023-06-01 HISTORY — DX: Unspecified osteoarthritis, unspecified site: M19.90

## 2023-06-01 HISTORY — PX: CATARACT EXTRACTION W/PHACO: SHX586

## 2023-06-01 SURGERY — PHACOEMULSIFICATION, CATARACT, WITH IOL INSERTION
Anesthesia: Monitor Anesthesia Care | Laterality: Right

## 2023-06-01 MED ORDER — ARMC OPHTHALMIC DILATING DROPS
1.0000 | OPHTHALMIC | Status: DC | PRN
  Administered 2023-06-01 (×3): 1 via OPHTHALMIC

## 2023-06-01 MED ORDER — CEFUROXIME OPHTHALMIC INJECTION 1 MG/0.1 ML
INJECTION | OPHTHALMIC | Status: DC | PRN
  Administered 2023-06-01: 1 mg via INTRACAMERAL

## 2023-06-01 MED ORDER — MIDAZOLAM HCL 2 MG/2ML IJ SOLN
INTRAMUSCULAR | Status: AC
  Filled 2023-06-01: qty 2

## 2023-06-01 MED ORDER — ARMC OPHTHALMIC DILATING DROPS
OPHTHALMIC | Status: AC
  Filled 2023-06-01: qty 0.5

## 2023-06-01 MED ORDER — SIGHTPATH DOSE#1 BSS IO SOLN
INTRAOCULAR | Status: DC | PRN
  Administered 2023-06-01: 15 mL via INTRAOCULAR

## 2023-06-01 MED ORDER — TETRACAINE HCL 0.5 % OP SOLN
OPHTHALMIC | Status: AC
  Filled 2023-06-01: qty 4

## 2023-06-01 MED ORDER — SIGHTPATH DOSE#1 BSS IO SOLN
INTRAOCULAR | Status: DC | PRN
  Administered 2023-06-01: 51 mL via OPHTHALMIC

## 2023-06-01 MED ORDER — FENTANYL CITRATE (PF) 100 MCG/2ML IJ SOLN
INTRAMUSCULAR | Status: AC
  Filled 2023-06-01: qty 2

## 2023-06-01 MED ORDER — MIDAZOLAM HCL 2 MG/2ML IJ SOLN
INTRAMUSCULAR | Status: DC | PRN
  Administered 2023-06-01: 2 mg via INTRAVENOUS

## 2023-06-01 MED ORDER — TETRACAINE HCL 0.5 % OP SOLN
1.0000 [drp] | OPHTHALMIC | Status: DC | PRN
  Administered 2023-06-01 (×3): 1 [drp] via OPHTHALMIC

## 2023-06-01 MED ORDER — FENTANYL CITRATE (PF) 100 MCG/2ML IJ SOLN
INTRAMUSCULAR | Status: DC | PRN
  Administered 2023-06-01: 25 ug via INTRAVENOUS
  Administered 2023-06-01: 50 ug via INTRAVENOUS

## 2023-06-01 MED ORDER — SIGHTPATH DOSE#1 BSS IO SOLN
INTRAOCULAR | Status: DC | PRN
  Administered 2023-06-01: 2 mL

## 2023-06-01 MED ORDER — SIGHTPATH DOSE#1 NA HYALUR & NA CHOND-NA HYALUR IO KIT
PACK | INTRAOCULAR | Status: DC | PRN
  Administered 2023-06-01: 1 via OPHTHALMIC

## 2023-06-01 MED ORDER — SODIUM CHLORIDE 0.9% FLUSH
INTRAVENOUS | Status: DC | PRN
  Administered 2023-06-01 (×2): 10 mL via INTRAVENOUS

## 2023-06-01 MED ORDER — BRIMONIDINE TARTRATE-TIMOLOL 0.2-0.5 % OP SOLN
OPHTHALMIC | Status: DC | PRN
  Administered 2023-06-01: 1 [drp] via OPHTHALMIC

## 2023-06-01 SURGICAL SUPPLY — 9 items
CATARACT SUITE SIGHTPATH (MISCELLANEOUS) ×1
FEE CATARACT SUITE SIGHTPATH (MISCELLANEOUS) ×1 IMPLANT
GLOVE SRG 8 PF TXTR STRL LF DI (GLOVE) ×1 IMPLANT
GLOVE SURG ENC TEXT LTX SZ7.5 (GLOVE) ×1 IMPLANT
GLOVE SURG UNDER POLY LF SZ8 (GLOVE) ×1
LENS IOL TECNIS EYHANCE 22.0 (Intraocular Lens) IMPLANT
NDL FILTER BLUNT 18X1 1/2 (NEEDLE) ×1 IMPLANT
NEEDLE FILTER BLUNT 18X1 1/2 (NEEDLE) ×1
SYR 3ML LL SCALE MARK (SYRINGE) ×1 IMPLANT

## 2023-06-01 NOTE — H&P (Signed)
[facility]   Primary Care Physician:  Alba Cory, MD Ophthalmologist: Dr. Lockie Mola  Pre-Procedure History & Physical: HPI:  Sara Randolph is a 62 y.o. female here for ophthalmic surgery.   Past Medical History:  Diagnosis Date   Allergy    Anxiety    Arthritis    hands   Asthma    Concussion with no loss of consciousness 02/28/2013   GERD (gastroesophageal reflux disease)    Hemorrhoids    High cholesterol    Lung nodule    Seborrheic keratosis     Past Surgical History:  Procedure Laterality Date   ABDOMINAL HYSTERECTOMY  1988   BREAST BIOPSY Right 07/04/2020   stereo bx, x-clip, FATTY BREAST TISSUE WITH LIMITED   CHOLECYSTECTOMY     DILATION AND CURETTAGE OF UTERUS     ECTOPIC PREGNANCY SURGERY     RHINOPLASTY  1990   SALPINGECTOMY     TONSILLECTOMY  1969   WRIST SURGERY  1983    Prior to Admission medications   Medication Sig Start Date End Date Taking? Authorizing Provider  albuterol (VENTOLIN HFA) 108 (90 Base) MCG/ACT inhaler Inhale 2 puffs into the lungs every 6 (six) hours as needed for wheezing or shortness of breath. 05/13/23  Yes Sowles, Danna Hefty, MD  Cyanocobalamin (VITAMIN B-12) 5000 MCG SUBL Place under the tongue daily.   Yes [provider]  estradiol (VIVELLE-DOT) 0.0375 MG/24HR Place 1 patch onto the skin 2 (two) times a week. 11/11/22  Yes Sowles, Danna Hefty, MD  ezetimibe (ZETIA) 10 MG tablet Take 10 mg by mouth daily.   Yes [provider]  levocetirizine (XYZAL) 5 MG tablet Take 5 mg by mouth every evening.   Yes [provider]  Multiple Vitamin (MULTIVITAMIN) tablet Take 1 tablet by mouth daily.   Yes [provider]  omeprazole (PRILOSEC) 40 MG capsule TAKE 1 CAPSULE (40 MG TOTAL) BY MOUTH DAILY. Patient taking differently: Take 40 mg by mouth daily as needed. 05/12/23  Yes Sowles, Danna Hefty, MD  valACYclovir (VALTREX) 1000 MG tablet Take 1 tablet (1,000 mg total) by mouth 2 (two)  times daily. Patient taking differently: Take 1,000 mg by mouth 2 (two) times daily as needed. 08/11/22  Yes Sowles, Danna Hefty, MD  Vitamin D, Ergocalciferol, (DRISDOL) 1.25 MG (50000 UNIT) CAPS capsule Take 1 capsule (50,000 Units total) by mouth once a week. 05/13/23  Yes Alba Cory, MD    Allergies as of 05/09/2023 - Review Complete 03/31/2023  Allergen Reaction Noted   Crestor [rosuvastatin]  04/28/2020   Atorvastatin  02/22/2017   Pravastatin Itching 01/13/2015   Venlafaxine Other (See Comments) 04/28/2018   Hydrocodone Nausea Only 03/29/2012    Family History  Problem Relation Age of Onset   Alzheimer's disease Mother    Parkinsonism Father    Heart disease Brother    Heart attack Brother    Depression Sister    Osteoporosis Sister    Breast cancer Neg Hx     Social History   Socioeconomic History   Marital status: Married    Spouse name: Buddy   Number of children: 2   Years of education: Not on file   Highest education level: 12th grade  Occupational History   Occupation: Dietitian  Tobacco Use   Smoking status: Never   Smokeless tobacco: Never  Vaping Use   Vaping status: Never Used  Substance and Sexual Activity   Alcohol use: Yes    Alcohol/week: 0.0 standard drinks of alcohol  Comment: Consumes alcohol on Saturday   Drug use: No   Sexual activity: Yes    Partners: Male  Other Topics Concern   Not on file  Social History Narrative   Not on file   Social Determinants of Health   Financial Resource Strain: Low Risk  (06/09/2021)   Overall Financial Resource Strain (CARDIA)    Difficulty of Paying Living Expenses: Not hard at all  Food Insecurity: No Food Insecurity (11/09/2022)   Hunger Vital Sign    Worried About Running Out of Food in the Last Year: Never true    Ran Out of Food in the Last Year: Never true  Transportation Needs: No Transportation Needs (11/09/2022)   PRAPARE - Administrator, Civil Service (Medical): No    Lack of  Transportation (Non-Medical): No  Physical Activity: Unknown (11/09/2022)   Exercise Vital Sign    Days of Exercise per Week: Patient declined    Minutes of Exercise per Session: Not on file  Stress: No Stress Concern Present (11/09/2022)   [employer] of Occupational Health - Occupational Stress Questionnaire    Feeling of Stress : Only a little  Social Connections: Moderately Isolated (11/09/2022)   Social Connection and Isolation Panel [NHANES]    Frequency of Communication with Friends and Family: More than three times a week    Frequency of Social Gatherings with Friends and Family: Three times a week    Attends Religious Services: Never    Active Member of Clubs or Organizations: No    Attends Banker Meetings: Not on file    Marital Status: Married  Intimate Partner Violence: Not At Risk (06/09/2021)   Humiliation, Afraid, Rape, and Kick questionnaire    Fear of Current or Ex-Partner: No    Emotionally Abused: No    Physically Abused: No    Sexually Abused: No    Review of Systems: See HPI, otherwise negative ROS  Physical Exam: BP (!) 107/50   Pulse (!) 55   Temp 98.6 F (37 C) (Temporal)   Resp 15   Ht 5' 2.01" (1.575 m)   Wt 65.3 kg   SpO2 99%   BMI 26.33 kg/m  General:   Alert,  pleasant and cooperative in NAD Head:  Normocephalic and atraumatic. Lungs:  Clear to auscultation.    Heart:  Regular rate and rhythm.   Impression/Plan: Sara Randolph is here for ophthalmic surgery.  Risks, benefits, limitations, and alternatives regarding ophthalmic surgery have been reviewed with the patient.  Questions have been answered.  All parties agreeable.   Lockie Mola, MD  06/01/2023, 8:08 AM

## 2023-06-01 NOTE — Transfer of Care (Signed)
Immediate Anesthesia Transfer of Care Note  Patient: Estellar Graff Frison  Procedure(s) Performed: CATARACT EXTRACTION PHACO AND INTRAOCULAR LENS PLACEMENT (IOC) RIGHT 8.08 00:54.0 (Right)  Patient Location: PACU  Anesthesia Type:MAC  Level of Consciousness: awake, alert , and oriented  Airway & Oxygen Therapy: Patient Spontanous Breathing  Post-op Assessment: Report given to RN and Post -op Vital signs reviewed and stable  Post vital signs: Reviewed and stable  Last Vitals:  Vitals Value Taken Time  BP 105/55 06/01/23 0910  Temp 36.3 C 06/01/23 0910  Pulse 63 06/01/23 0912  Resp 9 06/01/23 0912  SpO2 99 % 06/01/23 0912  Vitals shown include unfiled device data.  Last Pain:  Vitals:   06/01/23 0910  TempSrc:   PainSc: 0-No pain         Complications: No notable events documented.

## 2023-06-01 NOTE — Anesthesia Postprocedure Evaluation (Signed)
Anesthesia Post Note  Patient: Sara Randolph  Procedure(s) Performed: CATARACT EXTRACTION PHACO AND INTRAOCULAR LENS PLACEMENT (IOC) RIGHT 8.08 00:54.0 (Right)  Patient location during evaluation: PACU Anesthesia Type: MAC Level of consciousness: awake and alert Pain management: pain level controlled Vital Signs Assessment: post-procedure vital signs reviewed and stable Respiratory status: spontaneous breathing, nonlabored ventilation, respiratory function stable and patient connected to nasal cannula oxygen Cardiovascular status: stable and blood pressure returned to baseline Postop Assessment: no apparent nausea or vomiting Anesthetic complications: no   No notable events documented.   Last Vitals:  Vitals:   06/01/23 0910 06/01/23 0915  BP: (!) 105/55 118/65  Pulse: (!) 57 60  Resp: 12 13  Temp: (!) 36.3 C (!) 36.3 C  SpO2: 99% 97%    Last Pain:  Vitals:   06/01/23 0915  TempSrc:   PainSc: 0-No pain                 March Joos C Annessa Satre

## 2023-06-01 NOTE — Op Note (Signed)
LOCATION:  [facility]   PREOPERATIVE DIAGNOSIS:    Nuclear sclerotic cataract right eye. H25.11   POSTOPERATIVE DIAGNOSIS:  Nuclear sclerotic cataract right eye.     PROCEDURE:  Phacoemusification with posterior chamber intraocular lens placement of the right eye   ULTRASOUND TIME: Procedure(s): CATARACT EXTRACTION PHACO AND INTRAOCULAR LENS PLACEMENT (IOC) RIGHT 8.08 00:54.0 (Right)  LENS:   Implant Name Type Inv. Item Serial No. Manufacturer Lot No. LRB No. Used Action  LENS IOL TECNIS EYHANCE 22.0 - [id number] Intraocular Lens LENS IOL TECNIS EYHANCE 22.0 [id number] [employer]  Right 1 Implanted         SURGEON:  Deirdre Evener, MD   ANESTHESIA:  Topical with tetracaine drops and 2% Xylocaine jelly, augmented with 1% preservative-free intracameral lidocaine.    COMPLICATIONS:  None.   DESCRIPTION OF PROCEDURE:  The patient was identified in the holding room and transported to the operating room and placed in the supine position under the operating microscope.  The right eye was identified as the operative eye and it was prepped and draped in the usual sterile ophthalmic fashion.   A 1 millimeter clear-corneal paracentesis was made at the 12:00 position.  0.5 ml of preservative-free 1% lidocaine was injected into the anterior chamber. The anterior chamber was filled with Viscoat viscoelastic.  A 2.4 millimeter keratome was used to make a near-clear corneal incision at the 9:00 position.  A curvilinear capsulorrhexis was made with a cystotome and capsulorrhexis forceps.  Balanced salt solution was used to hydrodissect and hydrodelineate the nucleus.   Phacoemulsification was then used in stop and chop fashion to remove the lens nucleus and epinucleus.  The remaining cortex was then removed using the irrigation and aspiration handpiece. Provisc was then placed into the capsular bag to distend it for lens placement.  A lens was then injected into the capsular bag.  The  remaining viscoelastic was aspirated.   Wounds were hydrated with balanced salt solution.  The anterior chamber was inflated to a physiologic pressure with balanced salt solution.  No wound leaks were noted. Cefuroxime 0.1 ml of a 10mg /ml solution was injected into the anterior chamber for a dose of 1 mg of intracameral antibiotic at the completion of the case.   Timolol and Brimonidine drops were applied to the eye.  The patient was taken to the recovery room in stable condition without complications of anesthesia or surgery.   Liviah Cake 06/01/2023, 9:09 AM

## 2023-06-01 NOTE — Anesthesia Preprocedure Evaluation (Addendum)
Anesthesia Evaluation  Patient identified by MRN, date of birth, ID band Patient awake    Reviewed: Allergy & Precautions, H&P , NPO status , Patient's Chart, lab work & pertinent test results  Airway Mallampati: III  TM Distance: >3 FB Neck ROM: Full    Dental no notable dental hx.    Pulmonary asthma    Pulmonary exam normal breath sounds clear to auscultation       Cardiovascular negative cardio ROS Normal cardiovascular exam Rhythm:Regular Rate:Normal     Neuro/Psych   Anxiety      Neuromuscular disease  negative psych ROS   GI/Hepatic Neg liver ROS,GERD  ,,  Endo/Other  negative endocrine ROS    Renal/GU negative Renal ROS  negative genitourinary   Musculoskeletal negative musculoskeletal ROS (+) Arthritis ,    Abdominal   Peds negative pediatric ROS (+)  Hematology negative hematology ROS (+)   Anesthesia Other Findings Medical History  Asthma Hemorrhoids High cholesterol Concussion with no loss of consciousness Seborrheic keratosis GERD (gastroesophageal reflux disease) Allergy Anxiety Lung nodule Arthritis    Reproductive/Obstetrics negative OB ROS                             Anesthesia Physical Anesthesia Plan  ASA: 2  Anesthesia Plan: MAC   Post-op Pain Management:    Induction: Intravenous  PONV Risk Score and Plan:   Airway Management Planned: Natural Airway and Nasal Cannula  Additional Equipment:   Intra-op Plan:   Post-operative Plan:   Informed Consent: I have reviewed the patients History and Physical, chart, labs and discussed the procedure including the risks, benefits and alternatives for the proposed anesthesia with the patient or authorized representative who has indicated his/her understanding and acceptance.     Dental Advisory Given  Plan Discussed with: Anesthesiologist, CRNA and Surgeon  Anesthesia Plan Comments: (Patient consented for  risks of anesthesia including but not limited to:  - adverse reactions to medications - damage to eyes, teeth, lips or other oral mucosa - nerve damage due to positioning  - sore throat or hoarseness - Damage to heart, brain, nerves, lungs, other parts of body or loss of life  Patient voiced understanding and assent.)       Anesthesia Quick Evaluation

## 2023-06-02 ENCOUNTER — Encounter: Payer: Self-pay | Admitting: Ophthalmology

## 2023-06-07 DIAGNOSIS — H2512 Age-related nuclear cataract, left eye: Secondary | ICD-10-CM | POA: Diagnosis not present

## 2023-06-08 ENCOUNTER — Other Ambulatory Visit
Admission: RE | Admit: 2023-06-08 | Discharge: 2023-06-08 | Disposition: A | Discharge status: Home / Self Care | Payer: Federal, State, Local not specified - PPO | Source: Ambulatory Visit | Attending: Medical Genetics | Admitting: Medical Genetics

## 2023-06-08 DIAGNOSIS — Z006 Encounter for examination for normal comparison and control in clinical research program: Secondary | ICD-10-CM | POA: Insufficient documentation

## 2023-06-08 NOTE — Anesthesia Preprocedure Evaluation (Addendum)
Anesthesia Evaluation  Patient identified by MRN, date of birth, ID band Patient awake    Reviewed: Allergy & Precautions, H&P , NPO status , Patient's Chart, lab work & pertinent test results  Airway Mallampati: III  TM Distance: >3 FB Neck ROM: Full    Dental no notable dental hx.    Pulmonary asthma    Pulmonary exam normal breath sounds clear to auscultation       Cardiovascular negative cardio ROS Normal cardiovascular exam Rhythm:Regular Rate:Normal     Neuro/Psych   Anxiety      Neuromuscular disease  negative psych ROS   GI/Hepatic Neg liver ROS,GERD  ,,  Endo/Other  negative endocrine ROS    Renal/GU negative Renal ROS  negative genitourinary   Musculoskeletal  (+) Arthritis ,    Abdominal   Peds negative pediatric ROS (+)  Hematology negative hematology ROS (+)   Anesthesia Other Findings Previous cataract surgery 06-01-23  Asthma  Hemorrhoids High cholesterol  Concussion with no loss of consciousness Seborrheic keratosis  GERD (gastroesophageal reflux disease) Allergy  Anxiety Lung nodule  Arthritis    Reproductive/Obstetrics negative OB ROS                             Anesthesia Physical Anesthesia Plan  ASA: 2  Anesthesia Plan: MAC   Post-op Pain Management:    Induction: Intravenous  PONV Risk Score and Plan:   Airway Management Planned: Natural Airway and Nasal Cannula  Additional Equipment:   Intra-op Plan:   Post-operative Plan:   Informed Consent: I have reviewed the patients History and Physical, chart, labs and discussed the procedure including the risks, benefits and alternatives for the proposed anesthesia with the patient or authorized representative who has indicated his/her understanding and acceptance.     Dental Advisory Given  Plan Discussed with: Anesthesiologist, CRNA and Surgeon  Anesthesia Plan Comments: (Patient consented  for risks of anesthesia including but not limited to:  - adverse reactions to medications - damage to eyes, teeth, lips or other oral mucosa - nerve damage due to positioning  - sore throat or hoarseness - Damage to heart, brain, nerves, lungs, other parts of body or loss of life  Patient voiced understanding and assent.)        Anesthesia Quick Evaluation

## 2023-06-13 NOTE — Discharge Instructions (Signed)

## 2023-06-15 ENCOUNTER — Ambulatory Visit
Admission: RE | Admit: 2023-06-15 | Discharge: 2023-06-15 | Disposition: A | Discharge status: Home / Self Care | Payer: Federal, State, Local not specified - PPO | Attending: Ophthalmology | Admitting: Ophthalmology

## 2023-06-15 ENCOUNTER — Encounter: Payer: Self-pay | Admitting: Ophthalmology

## 2023-06-15 ENCOUNTER — Ambulatory Visit: Payer: Federal, State, Local not specified - PPO | Admitting: Anesthesiology

## 2023-06-15 ENCOUNTER — Encounter
Admission: RE | Disposition: A | Discharge status: Home / Self Care | Payer: Self-pay | Source: Home / Self Care | Attending: Ophthalmology

## 2023-06-15 ENCOUNTER — Other Ambulatory Visit: Payer: Self-pay

## 2023-06-15 DIAGNOSIS — H269 Unspecified cataract: Secondary | ICD-10-CM | POA: Diagnosis not present

## 2023-06-15 DIAGNOSIS — H2512 Age-related nuclear cataract, left eye: Secondary | ICD-10-CM | POA: Insufficient documentation

## 2023-06-15 DIAGNOSIS — E785 Hyperlipidemia, unspecified: Secondary | ICD-10-CM | POA: Diagnosis not present

## 2023-06-15 HISTORY — PX: CATARACT EXTRACTION W/PHACO: SHX586

## 2023-06-15 SURGERY — PHACOEMULSIFICATION, CATARACT, WITH IOL INSERTION
Anesthesia: Monitor Anesthesia Care | Site: Eye | Laterality: Left

## 2023-06-15 MED ORDER — TETRACAINE HCL 0.5 % OP SOLN
1.0000 [drp] | OPHTHALMIC | Status: DC | PRN
  Administered 2023-06-15 (×3): 1 [drp] via OPHTHALMIC

## 2023-06-15 MED ORDER — TETRACAINE HCL 0.5 % OP SOLN
OPHTHALMIC | Status: AC
  Filled 2023-06-15: qty 4

## 2023-06-15 MED ORDER — SODIUM CHLORIDE 0.9% FLUSH: 10.0000 mL | INTRAVENOUS | Status: DC | PRN

## 2023-06-15 MED ORDER — SIGHTPATH DOSE#1 NA HYALUR & NA CHOND-NA HYALUR IO KIT
PACK | INTRAOCULAR | Status: DC | PRN
  Administered 2023-06-15: 1 via OPHTHALMIC

## 2023-06-15 MED ORDER — MIDAZOLAM HCL 2 MG/2ML IJ SOLN
INTRAMUSCULAR | Status: DC | PRN
  Administered 2023-06-15 (×2): 2 mg via INTRAVENOUS

## 2023-06-15 MED ORDER — FENTANYL CITRATE (PF) 100 MCG/2ML IJ SOLN
INTRAMUSCULAR | Status: DC | PRN
  Administered 2023-06-15 (×2): 50 ug via INTRAVENOUS

## 2023-06-15 MED ORDER — FENTANYL CITRATE (PF) 100 MCG/2ML IJ SOLN
INTRAMUSCULAR | Status: AC
  Filled 2023-06-15: qty 2

## 2023-06-15 MED ORDER — LACTATED RINGERS IV SOLN: INTRAVENOUS | Status: DC

## 2023-06-15 MED ORDER — MIDAZOLAM HCL 2 MG/2ML IJ SOLN
INTRAMUSCULAR | Status: AC
  Filled 2023-06-15: qty 2

## 2023-06-15 MED ORDER — SIGHTPATH DOSE#1 BSS IO SOLN
INTRAOCULAR | Status: DC | PRN
  Administered 2023-06-15: 83 mL via OPHTHALMIC

## 2023-06-15 MED ORDER — SODIUM CHLORIDE 0.9% FLUSH
INTRAVENOUS | Status: DC | PRN
Start: 2023-06-15 — End: 2023-06-15
  Administered 2023-06-15: 10 mL via INTRAVENOUS

## 2023-06-15 MED ORDER — CEFUROXIME OPHTHALMIC INJECTION 1 MG/0.1 ML
INJECTION | OPHTHALMIC | Status: DC | PRN
  Administered 2023-06-15: .1 mL via INTRACAMERAL

## 2023-06-15 MED ORDER — SIGHTPATH DOSE#1 BSS IO SOLN
INTRAOCULAR | Status: DC | PRN
  Administered 2023-06-15: 2 mL

## 2023-06-15 MED ORDER — SIGHTPATH DOSE#1 BSS IO SOLN
INTRAOCULAR | Status: DC | PRN
  Administered 2023-06-15: 15 mL via INTRAOCULAR

## 2023-06-15 MED ORDER — ARMC OPHTHALMIC DILATING DROPS
1.0000 | OPHTHALMIC | Status: DC | PRN
  Administered 2023-06-15 (×3): 1 via OPHTHALMIC

## 2023-06-15 MED ORDER — BRIMONIDINE TARTRATE-TIMOLOL 0.2-0.5 % OP SOLN
OPHTHALMIC | Status: DC | PRN
  Administered 2023-06-15: 1 [drp] via OPHTHALMIC

## 2023-06-15 SURGICAL SUPPLY — 11 items
CANNULA ANT/CHMB 27G (MISCELLANEOUS) IMPLANT
CANNULA ANT/CHMB 27GA (MISCELLANEOUS)
CATARACT SUITE SIGHTPATH (MISCELLANEOUS) ×1
FEE CATARACT SUITE SIGHTPATH (MISCELLANEOUS) ×1 IMPLANT
GLOVE SRG 8 PF TXTR STRL LF DI (GLOVE) ×1 IMPLANT
GLOVE SURG ENC TEXT LTX SZ7.5 (GLOVE) ×1 IMPLANT
GLOVE SURG UNDER POLY LF SZ8 (GLOVE) ×1
LENS IOL TECNIS EYHANCE 26.0 (Intraocular Lens) IMPLANT
NDL FILTER BLUNT 18X1 1/2 (NEEDLE) ×1 IMPLANT
NEEDLE FILTER BLUNT 18X1 1/2 (NEEDLE) ×1
SYR 3ML LL SCALE MARK (SYRINGE) ×1 IMPLANT

## 2023-06-15 NOTE — Op Note (Signed)
OPERATIVE NOTE  Sara Randolph [id number] 06/15/2023   PREOPERATIVE DIAGNOSIS:  Nuclear sclerotic cataract left eye. H25.12   POSTOPERATIVE DIAGNOSIS:    Nuclear sclerotic cataract left eye.     PROCEDURE:  Phacoemusification with posterior chamber intraocular lens placement of the left eye  Ultrasound time: Procedure(s): CATARACT EXTRACTION PHACO AND INTRAOCULAR LENS PLACEMENT (IOC) LEFT 7.41 00:32.9 (Left)  LENS:   Implant Name Type Inv. Item Serial No. Manufacturer Lot No. LRB No. Used Action  LENS IOL TECNIS EYHANCE 26.0 - [id number] Intraocular Lens LENS IOL TECNIS EYHANCE 26.0 [id number] [employer]  Left 1 Implanted      SURGEON:  Deirdre Evener, MD   ANESTHESIA:  Topical with tetracaine drops and 2% Xylocaine jelly, augmented with 1% preservative-free intracameral lidocaine.    COMPLICATIONS:  None.   DESCRIPTION OF PROCEDURE:  The patient was identified in the holding room and transported to the operating room and placed in the supine position under the operating microscope.  The left eye was identified as the operative eye and it was prepped and draped in the usual sterile ophthalmic fashion.   A 1 millimeter clear-corneal paracentesis was made at the 1:30 position.  0.5 ml of preservative-free 1% lidocaine was injected into the anterior chamber.  The anterior chamber was filled with Viscoat viscoelastic.  A 2.4 millimeter keratome was used to make a near-clear corneal incision at the 10:30 position.  .  A curvilinear capsulorrhexis was made with a cystotome and capsulorrhexis forceps.  Balanced salt solution was used to hydrodissect and hydrodelineate the nucleus.   Phacoemulsification was then used in stop and chop fashion to remove the lens nucleus and epinucleus.  The remaining cortex was then removed using the irrigation and aspiration handpiece. Provisc was then placed into the capsular bag to distend it for lens placement.  A lens was then injected into  the capsular bag.  The remaining viscoelastic was aspirated.   Wounds were hydrated with balanced salt solution.  The anterior chamber was inflated to a physiologic pressure with balanced salt solution.  No wound leaks were noted. Cefuroxime 0.1 ml of a 10mg /ml solution was injected into the anterior chamber for a dose of 1 mg of intracameral antibiotic at the completion of the case.   Timolol and Brimonidine drops were applied to the eye.  The patient was taken to the recovery room in stable condition without complications of anesthesia or surgery.  [name] 06/15/2023, 11:51 AM

## 2023-06-15 NOTE — H&P (Signed)
[facility]   Primary Care Physician:  Alba Cory, MD Ophthalmologist: Dr. Lockie Mola  Pre-Procedure History & Physical: HPI:  Sara Randolph is a 62 y.o. female here for ophthalmic surgery.   Past Medical History:  Diagnosis Date   Allergy    Anxiety    Arthritis    hands   Asthma    Concussion with no loss of consciousness 02/28/2013   GERD (gastroesophageal reflux disease)    Hemorrhoids    High cholesterol    Lung nodule    Seborrheic keratosis     Past Surgical History:  Procedure Laterality Date   ABDOMINAL HYSTERECTOMY  1988   BREAST BIOPSY Right 07/04/2020   stereo bx, x-clip, FATTY BREAST TISSUE WITH LIMITED   CATARACT EXTRACTION W/PHACO Right 06/01/2023   Procedure: CATARACT EXTRACTION PHACO AND INTRAOCULAR LENS PLACEMENT (IOC) RIGHT 8.08 00:54.0;  Surgeon: Lockie Mola, MD;  Location: [facility];  Service: Ophthalmology;  Laterality: Right;   CHOLECYSTECTOMY     DILATION AND CURETTAGE OF UTERUS     ECTOPIC PREGNANCY SURGERY     RHINOPLASTY  1990   SALPINGECTOMY     TONSILLECTOMY  1969   WRIST SURGERY  1983    Prior to Admission medications   Medication Sig Start Date End Date Taking? Authorizing Provider  Cyanocobalamin (VITAMIN B-12) 5000 MCG SUBL Place under the tongue daily.   Yes [provider]  estradiol (VIVELLE-DOT) 0.0375 MG/24HR Place 1 patch onto the skin 2 (two) times a week. 11/11/22  Yes Sowles, Danna Hefty, MD  ezetimibe (ZETIA) 10 MG tablet Take 10 mg by mouth daily.   Yes [provider]  levocetirizine (XYZAL) 5 MG tablet Take 5 mg by mouth every evening.   Yes [provider]  Multiple Vitamin (MULTIVITAMIN) tablet Take 1 tablet by mouth daily.   Yes [provider]  Vitamin D, Ergocalciferol, (DRISDOL) 1.25 MG (50000 UNIT) CAPS capsule Take 1 capsule (50,000 Units total) by mouth once a week. 05/13/23  Yes Sowles, Danna Hefty, MD  albuterol (VENTOLIN HFA) 108 (90  Base) MCG/ACT inhaler Inhale 2 puffs into the lungs every 6 (six) hours as needed for wheezing or shortness of breath. 05/13/23   Alba Cory, MD  omeprazole (PRILOSEC) 40 MG capsule TAKE 1 CAPSULE (40 MG TOTAL) BY MOUTH DAILY. Patient taking differently: Take 40 mg by mouth daily as needed. 05/12/23   Alba Cory, MD  valACYclovir (VALTREX) 1000 MG tablet Take 1 tablet (1,000 mg total) by mouth 2 (two) times daily. Patient taking differently: Take 1,000 mg by mouth 2 (two) times daily as needed. 08/11/22   Alba Cory, MD    Allergies as of 05/09/2023 - Review Complete 03/31/2023  Allergen Reaction Noted   Crestor [rosuvastatin]  04/28/2020   Atorvastatin  02/22/2017   Pravastatin Itching 01/13/2015   Venlafaxine Other (See Comments) 04/28/2018   Hydrocodone Nausea Only 03/29/2012    Family History  Problem Relation Age of Onset   Alzheimer's disease Mother    Parkinsonism Father    Heart disease Brother    Heart attack Brother    Depression Sister    Osteoporosis Sister    Breast cancer Neg Hx     Social History   Socioeconomic History   Marital status: Married    Spouse name: Buddy   Number of children: 2   Years of education: Not on file   Highest education level: 12th grade  Occupational History   Occupation: Dietitian  Tobacco Use  Smoking status: Never   Smokeless tobacco: Never  Vaping Use   Vaping status: Never Used  Substance and Sexual Activity   Alcohol use: Yes    Alcohol/week: 0.0 standard drinks of alcohol    Comment: Consumes alcohol on Saturday   Drug use: No   Sexual activity: Yes    Partners: Male  Other Topics Concern   Not on file  Social History Narrative   Not on file   Social Determinants of Health   Financial Resource Strain: Low Risk  (06/09/2021)   Overall Financial Resource Strain (CARDIA)    Difficulty of Paying Living Expenses: Not hard at all  Food Insecurity: No Food Insecurity (11/09/2022)   Hunger Vital Sign     Worried About Running Out of Food in the Last Year: Never true    Ran Out of Food in the Last Year: Never true  Transportation Needs: No Transportation Needs (11/09/2022)   PRAPARE - Administrator, Civil Service (Medical): No    Lack of Transportation (Non-Medical): No  Physical Activity: Unknown (11/09/2022)   Exercise Vital Sign    Days of Exercise per Week: Patient declined    Minutes of Exercise per Session: Not on file  Stress: No Stress Concern Present (11/09/2022)   [employer] of Occupational Health - Occupational Stress Questionnaire    Feeling of Stress : Only a little  Social Connections: Moderately Isolated (11/09/2022)   Social Connection and Isolation Panel [NHANES]    Frequency of Communication with Friends and Family: More than three times a week    Frequency of Social Gatherings with Friends and Family: Three times a week    Attends Religious Services: Never    Active Member of Clubs or Organizations: No    Attends Banker Meetings: Not on file    Marital Status: Married  Intimate Partner Violence: Not At Risk (06/09/2021)   Humiliation, Afraid, Rape, and Kick questionnaire    Fear of Current or Ex-Partner: No    Emotionally Abused: No    Physically Abused: No    Sexually Abused: No    Review of Systems: See HPI, otherwise negative ROS  Physical Exam: BP (!) 118/54   Temp 98 F (36.7 C) (Temporal)   Resp 13   Ht 5' 2.01" (1.575 m)   Wt 65.4 kg   SpO2 97%   BMI 26.37 kg/m  General:   Alert,  pleasant and cooperative in NAD Head:  Normocephalic and atraumatic. Lungs:  Clear to auscultation.    Heart:  Regular rate and rhythm.   Impression/Plan: Sara Randolph is here for ophthalmic surgery.  Risks, benefits, limitations, and alternatives regarding ophthalmic surgery have been reviewed with the patient.  Questions have been answered.  All parties agreeable.   Lockie Mola, MD  06/15/2023, 11:06 AM

## 2023-06-15 NOTE — Anesthesia Postprocedure Evaluation (Signed)
Anesthesia Post Note  Patient: Sara Randolph  Procedure(s) Performed: CATARACT EXTRACTION PHACO AND INTRAOCULAR LENS PLACEMENT (IOC) LEFT 7.41 00:32.9 (Left: Eye)  Patient location during evaluation: PACU Anesthesia Type: MAC Level of consciousness: awake and alert Pain management: pain level controlled Vital Signs Assessment: post-procedure vital signs reviewed and stable Respiratory status: spontaneous breathing, nonlabored ventilation, respiratory function stable and patient connected to nasal cannula oxygen Cardiovascular status: stable and blood pressure returned to baseline Postop Assessment: no apparent nausea or vomiting Anesthetic complications: no   No notable events documented.   Last Vitals:  Vitals:   06/15/23 1151 06/15/23 1157  BP: (!) 116/56 116/61  Pulse: 62 67  Resp: 10 14  Temp: (!) 36.1 C (!) 36.1 C  SpO2: 94% 95%    Last Pain:  Vitals:   06/15/23 1157  TempSrc:   PainSc: 0-No pain                 Kamaree Berkel C Lowry Bala

## 2023-06-15 NOTE — Transfer of Care (Signed)
Immediate Anesthesia Transfer of Care Note  Patient: Sara Randolph  Procedure(s) Performed: CATARACT EXTRACTION PHACO AND INTRAOCULAR LENS PLACEMENT (IOC) LEFT 7.41 00:32.9 (Left: Eye)  Patient Location: PACU  Anesthesia Type: MAC  Level of Consciousness: awake, alert  and patient cooperative  Airway and Oxygen Therapy: Patient Spontanous Breathing and Patient connected to supplemental oxygen  Post-op Assessment: Post-op Vital signs reviewed, Patient's Cardiovascular Status Stable, Respiratory Function Stable, Patent Airway and No signs of Nausea or vomiting  Post-op Vital Signs: Reviewed and stable  Complications: No notable events documented.

## 2023-06-16 ENCOUNTER — Encounter: Payer: Self-pay | Admitting: Ophthalmology

## 2023-06-17 LAB — GENECONNECT MOLECULAR SCREEN: Genetic Analysis Overall Interpretation: NEGATIVE

## 2023-06-18 ENCOUNTER — Encounter (INDEPENDENT_AMBULATORY_CARE_PROVIDER_SITE_OTHER): Payer: Self-pay

## 2023-07-04 ENCOUNTER — Ambulatory Visit: Payer: Federal, State, Local not specified - PPO | Admitting: Dermatology

## 2023-07-04 ENCOUNTER — Other Ambulatory Visit: Payer: Self-pay | Admitting: Family Medicine

## 2023-07-04 DIAGNOSIS — Z1283 Encounter for screening for malignant neoplasm of skin: Secondary | ICD-10-CM

## 2023-07-04 DIAGNOSIS — L814 Other melanin hyperpigmentation: Secondary | ICD-10-CM

## 2023-07-04 DIAGNOSIS — W908XXA Exposure to other nonionizing radiation, initial encounter: Secondary | ICD-10-CM

## 2023-07-04 DIAGNOSIS — D1801 Hemangioma of skin and subcutaneous tissue: Secondary | ICD-10-CM

## 2023-07-04 DIAGNOSIS — D229 Melanocytic nevi, unspecified: Secondary | ICD-10-CM

## 2023-07-04 DIAGNOSIS — L578 Other skin changes due to chronic exposure to nonionizing radiation: Secondary | ICD-10-CM | POA: Diagnosis not present

## 2023-07-04 DIAGNOSIS — Z1231 Encounter for screening mammogram for malignant neoplasm of breast: Secondary | ICD-10-CM

## 2023-07-04 DIAGNOSIS — Z872 Personal history of diseases of the skin and subcutaneous tissue: Secondary | ICD-10-CM

## 2023-07-04 DIAGNOSIS — L820 Inflamed seborrheic keratosis: Secondary | ICD-10-CM | POA: Diagnosis not present

## 2023-07-04 DIAGNOSIS — D239 Other benign neoplasm of skin, unspecified: Secondary | ICD-10-CM

## 2023-07-04 DIAGNOSIS — L821 Other seborrheic keratosis: Secondary | ICD-10-CM

## 2023-07-04 NOTE — Patient Instructions (Addendum)
Seborrheic Keratosis  What causes seborrheic keratoses? Seborrheic keratoses are harmless, common skin growths that first appear during adult life.  As time goes by, more growths appear.  Some people may develop a large number of them.  Seborrheic keratoses appear on both covered and uncovered body parts.  They are not caused by sunlight.  The tendency to develop seborrheic keratoses can be inherited.  They vary in color from skin-colored to gray, brown, or even black.  They can be either smooth or have a rough, warty surface.   Seborrheic keratoses are superficial and look as if they were stuck on the skin.  Under the microscope this type of keratosis looks like layers upon layers of skin.  That is why at times the top layer may seem to fall off, but the rest of the growth remains and re-grows.    Treatment Seborrheic keratoses do not need to be treated, but can easily be removed in the office.  Seborrheic keratoses often cause symptoms when they rub on clothing or jewelry.  Lesions can be in the way of shaving.  If they become inflamed, they can cause itching, soreness, or burning.  Removal of a seborrheic keratosis can be accomplished by freezing, burning, or surgery. If any spot bleeds, scabs, or grows rapidly, please return to have it checked, as these can be an indication of a skin cancer.    Melanoma ABCDEs  Melanoma is the most dangerous type of skin cancer, and is the leading cause of death from skin disease.  You are more likely to develop melanoma if you: Have light-colored skin, light-colored eyes, or red or blond hair Spend a lot of time in the sun Tan regularly, either outdoors or in a tanning bed Have had blistering sunburns, especially during childhood Have a close family member who has had a melanoma Have atypical moles or large birthmarks  Early detection of melanoma is key since treatment is typically straightforward and cure rates are extremely high if we catch it early.    The first sign of melanoma is often a change in a mole or a new dark spot.  The ABCDE system is a way of remembering the signs of melanoma.  A for asymmetry:  The two halves do not match. B for border:  The edges of the growth are irregular. C for color:  A mixture of colors are present instead of an even brown color. D for diameter:  Melanomas are usually (but not always) greater than 6mm - the size of a pencil eraser. E for evolution:  The spot keeps changing in size, shape, and color.  Please check your skin once per month between visits. You can use a small mirror in front and a large mirror behind you to keep an eye on the back side or your body.   If you see any new or changing lesions before your next follow-up, please call to schedule a visit.  Please continue daily skin protection including broad spectrum sunscreen SPF 30+ to sun-exposed areas, reapplying every 2 hours as needed when you're outdoors.   Staying in the shade or wearing long sleeves, sun glasses (UVA+UVB protection) and wide brim hats (4-inch brim around the entire circumference of the hat) are also recommended for sun protection.    Due to recent changes in healthcare laws, you may see results of your pathology and/or laboratory studies on MyChart before the doctors have had a chance to review them. We understand that in some cases there  may be results that are confusing or concerning to you. Please understand that not all results are received at the same time and often the doctors may need to interpret multiple results in order to provide you with the best plan of care or course of treatment. Therefore, we ask that you please give [place] 2 business days to thoroughly review all your results before contacting the office for clarification. Should we see a critical lab result, you will be contacted sooner.   If You Need Anything After Your Visit  If you have any questions or concerns for your doctor, please call our main  line at [phone] and press option 4 to reach your doctor's medical assistant. If no one answers, please leave a voicemail as directed and we will return your call as soon as possible. Messages left after 4 pm will be answered the following business day.   You may also send [place] a message via MyChart. We typically respond to MyChart messages within 1-2 business days.  For prescription refills, please ask your pharmacy to contact our office. Our fax number is [phone].  If you have an urgent issue when the clinic is closed that cannot wait until the next business day, you can page your doctor at the number below.    Please note that while we do our best to be available for urgent issues outside of office hours, we are not available 24/7.   If you have an urgent issue and are unable to reach [place], you may choose to seek medical care at your doctor's office, retail clinic, urgent care center, or emergency room.  If you have a medical emergency, please immediately call 911 or go to the emergency department.  Pager Numbers  - Dr. Gwen Pounds: [phone]  - Dr. Roseanne Reno: [phone]  - Dr. Katrinka Blazing: [phone]   In the event of inclement weather, please call our main line at [phone] for an update on the status of any delays or closures.  Dermatology Medication Tips: Please keep the boxes that topical medications come in in order to help keep track of the instructions about where and how to use these. Pharmacies typically print the medication instructions only on the boxes and not directly on the medication tubes.   If your medication is too expensive, please contact our office at [phone] option 4 or send [place] a message through MyChart.   We are unable to tell what your co-pay for medications will be in advance as this is different depending on your insurance coverage. However, we may be able to find a substitute medication at lower cost or fill out paperwork to get insurance to cover  a needed medication.   If a prior authorization is required to get your medication covered by your insurance company, please allow [place] 1-2 business days to complete this process.  Drug prices often vary depending on where the prescription is filled and some pharmacies may offer cheaper prices.  The website www.goodrx.com contains coupons for medications through different pharmacies. The prices here do not account for what the cost may be with help from insurance (it may be cheaper with your insurance), but the website can give you the price if you did not use any insurance.  - You can print the associated coupon and take it with your prescription to the pharmacy.  - You may also stop by our office during regular business hours and pick up a GoodRx coupon card.  - If you need your prescription sent electronically  to a different pharmacy, notify our office through [facility] or by phone at [phone] option 4.     Si Usted Necesita Algo Despus de Su Visita  Tambin puede enviarnos un mensaje a travs de Clinical cytogeneticist. Por lo general respondemos a los mensajes de MyChart en el transcurso de 1 a 2 das hbiles.  Para renovar recetas, por favor pida a su farmacia que se ponga en contacto con nuestra oficina. Annie Sable de fax es [place] [phone].  Si tiene un asunto urgente cuando la clnica est cerrada y que no puede esperar hasta el siguiente da hbil, puede llamar/localizar a su doctor(a) al nmero que aparece a continuacin.   Por favor, tenga en cuenta que aunque hacemos todo lo posible para estar disponibles para asuntos urgentes fuera del horario de [place], no estamos disponibles las 24 horas del da, los 7 [address]  [address] de la [place].   Si tiene un problema urgente y no puede comunicarse con nosotros, puede optar por buscar atencin mdica  en el consultorio de su doctor(a), en una clnica privada, en un centro de atencin urgente o en una sala de emergencias.  Si tiene Psychologist, clinical, por favor llame inmediatamente al 911 o vaya a la sala de emergencias.  Nmeros de bper  - Dr. Gwen Pounds: [phone]  - Dra. Roseanne Reno: [phone]  - Dr. Katrinka Blazing: [phone]   En caso de inclemencias del tiempo, por favor llame a Lacy Duverney principal al [phone] para una actualizacin sobre el [place] de cualquier retraso o cierre.  Consejos para la medicacin en dermatologa: Por favor, guarde las cajas en las que vienen los medicamentos de uso tpico para ayudarle a seguir las instrucciones sobre dnde y cmo usarlos. Las farmacias generalmente imprimen las instrucciones del medicamento slo en las cajas y no directamente en los tubos del [place].   Si su medicamento es muy caro, por favor, pngase en contacto con Rolm Gala llamando al [phone] y presione la opcin 4 o envenos un mensaje a travs de Clinical cytogeneticist.   No podemos decirle cul ser su copago por los medicamentos por adelantado ya que esto es diferente dependiendo de la cobertura de su seguro. Sin embargo, es posible que podamos encontrar un medicamento sustituto a Audiological scientist un formulario para que el seguro cubra el medicamento que se considera necesario.   Si se requiere una autorizacin previa para que su compaa de seguros [place] su medicamento, por favor permtanos de 1 a 2 das hbiles para completar [address]  Los precios de los medicamentos varan con frecuencia dependiendo del Environmental consultant de dnde se surte la receta y alguna farmacias pueden ofrecer precios ms baratos.  El sitio web www.goodrx.com tiene cupones para medicamentos de Health and safety inspector. Los precios aqu no tienen en cuenta lo que podra costar con la ayuda del seguro (puede ser ms barato con su seguro), pero el sitio web puede darle el precio si no utiliz Tourist information centre manager.  - Puede imprimir el cupn correspondiente y llevarlo con su receta a la farmacia.  - Tambin puede pasar por nuestra oficina durante el horario de  atencin regular y Education officer, museum una tarjeta de cupones de GoodRx.  - Si necesita que su receta se enve electrnicamente a una farmacia diferente, informe a nuestra oficina a travs de MyChart de  o por telfono llamando al [phone] y presione la opcin 4.

## 2023-07-04 NOTE — Progress Notes (Signed)
Follow-Up Visit   Subjective  Sara Randolph is a 62 y.o. female who presents for the following: Skin Cancer Screening and Full Body Skin Exam Hx of isks Has some irritated spots at her waistline.  The patient presents for Total-Body Skin Exam (TBSE) for skin cancer screening and mole check. The patient has spots, moles and lesions to be evaluated, some may be new or changing and the patient may have concern these could be cancer.    The following portions of the chart were reviewed this encounter and updated as appropriate: medications, allergies, medical history  Review of Systems:  No other skin or systemic complaints except as noted in HPI or Assessment and Plan.  Objective  Well appearing patient in no apparent distress; mood and affect are within normal limits.  A full examination was performed including scalp, head, eyes, ears, nose, lips, neck, chest, axillae, abdomen, back, buttocks, bilateral upper extremities, bilateral lower extremities, hands, feet, fingers, toes, fingernails, and toenails. All findings within normal limits unless otherwise noted below.   Relevant physical exam findings are noted in the Assessment and Plan.  left flank at waistline x 4, left medial upper thigh x 1, right flank at waistline x 3 (7) Erythematous stuck-on, waxy papule    Assessment & Plan   SKIN CANCER SCREENING PERFORMED TODAY.  ACTINIC DAMAGE - Chronic condition, secondary to cumulative UV/sun exposure - diffuse scaly erythematous macules with underlying dyspigmentation - Recommend daily broad spectrum sunscreen SPF 30+ to sun-exposed areas, reapply every 2 hours as needed.  - Staying in the shade or wearing long sleeves, sun glasses (UVA+UVB protection) and wide brim hats (4-inch brim around the entire circumference of the hat) are also recommended for sun protection.  - Call for new or changing lesions.  LENTIGINES, SEBORRHEIC KERATOSES, HEMANGIOMAS - Benign normal skin lesions -  Benign-appearing - Call for any changes  Counseling for BBL / IPL / Laser and Coordination of Care for lentigines at face Discussed the treatment option of Broad Band Light (BBL) /Intense Pulsed Light (IPL)/ Laser for skin discoloration, including brown spots and redness.  Typically we recommend at least 1-3 treatment sessions about 5-8 weeks apart for best results.  Cannot have tanned skin when BBL performed, and regular use of sunscreen/photoprotection is advised after the procedure to help maintain results. The patient's condition may also require "maintenance treatments" in the future.  The fee for BBL / laser treatments is $350 per treatment session for the whole face.  A fee can be quoted for other parts of the body.  Insurance typically does not pay for BBL/laser treatments and therefore the fee is an out-of-pocket cost. Recommend prophylactic valtrex treatment. Once scheduled for procedure, will send Rx in prior to patient's appointment.     MELANOCYTIC NEVI - Tan-brown and/or pink-flesh-colored symmetric macules and papules - Benign appearing on exam today - Observation - Call clinic for new or changing moles - Recommend daily use of broad spectrum spf 30+ sunscreen to sun-exposed areas.   DERMATOFIBROMA Right medial ankle  Exam: Firm pink/brown papulenodule with dimple sign. Treatment Plan: A dermatofibroma is a benign growth possibly related to trauma, such as an insect bite, cut from shaving, or inflamed acne-type bump.  Treatment options to remove include shave or excision with resulting scar and risk of recurrence.  Since benign-appearing and not bothersome, will observe for now.      Inflamed seborrheic keratosis (7) left flank at waistline x 4, left medial upper thigh x 1,  right flank at waistline x 3  Symptomatic, irritating, patient would like treated.  Destruction of lesion - left flank at waistline x 4, left medial upper thigh x 1, right flank at waistline x 3  (7)  Destruction method: cryotherapy   Informed consent: discussed and consent obtained   Lesion destroyed using liquid nitrogen: Yes   Region frozen until ice ball extended beyond lesion: Yes   Outcome: patient tolerated procedure well with no complications   Post-procedure details: wound care instructions given   Additional details:  Prior to procedure, discussed risks of blister formation, small wound, skin dyspigmentation, or rare scar following cryotherapy. Recommend Vaseline ointment to treated areas while healing.    Return in about 1 year (around 07/03/2024) for TBSE.  I, Asher Muir, CMA, am acting as scribe for Willeen Niece, MD.   Documentation: I have reviewed the above documentation for accuracy and completeness, and I agree with the above.  Willeen Niece, MD

## 2023-07-05 DIAGNOSIS — Z0100 Encounter for examination of eyes and vision without abnormal findings: Secondary | ICD-10-CM | POA: Diagnosis not present

## 2023-07-22 ENCOUNTER — Ambulatory Visit
Admission: RE | Admit: 2023-07-22 | Discharge: 2023-07-22 | Disposition: A | Discharge status: Home / Self Care | Payer: Federal, State, Local not specified - PPO | Source: Ambulatory Visit | Attending: Family Medicine | Admitting: Family Medicine

## 2023-07-22 DIAGNOSIS — Z1231 Encounter for screening mammogram for malignant neoplasm of breast: Secondary | ICD-10-CM | POA: Insufficient documentation

## 2023-08-02 DIAGNOSIS — M79641 Pain in right hand: Secondary | ICD-10-CM | POA: Diagnosis not present

## 2023-08-02 DIAGNOSIS — M65311 Trigger thumb, right thumb: Secondary | ICD-10-CM | POA: Insufficient documentation

## 2023-08-18 ENCOUNTER — Other Ambulatory Visit: Payer: Self-pay | Admitting: Family Medicine

## 2023-08-29 ENCOUNTER — Encounter: Payer: Self-pay | Admitting: Family Medicine

## 2023-08-29 ENCOUNTER — Telehealth: Payer: Federal, State, Local not specified - PPO | Admitting: Family Medicine

## 2023-08-29 DIAGNOSIS — R600 Localized edema: Secondary | ICD-10-CM | POA: Diagnosis not present

## 2023-08-29 DIAGNOSIS — L232 Allergic contact dermatitis due to cosmetics: Secondary | ICD-10-CM

## 2023-08-29 MED ORDER — ZORYVE 0.15 % EX CREA: 1.0000 | TOPICAL_CREAM | Freq: Every day | CUTANEOUS | 0 refills | Status: DC

## 2023-08-29 NOTE — Progress Notes (Signed)
Name: Sara Randolph   MRN: [id number]    DOB: [birth year]   Date:08/29/2023       Progress Note  Subjective  Chief Complaint  Chief Complaint  Patient presents with   Pain    Around lips, very swollen unsure if related to chapstick. Third flare up    I connected with  Sara Randolph  on 08/29/23 at  2:00 PM EST by a video enabled telemedicine application and verified that I am speaking with the correct person using two identifiers.  I discussed the limitations of evaluation and management by telemedicine and the availability of in person appointments. The patient expressed understanding and agreed to proceed with a virtual visit  Staff also discussed with the patient that there may be a patient responsible charge related to this service. Patient Location: in her car Provider Location: [facility] Additional Individuals present: grandson  HPI   Lipid edema and dryness: she states over the past three months she had three separate occasions that her lips got irritated and swollen. Once it happened with a pm retinoid lip product, once with IT lip stickt, yesterday she used an expensive chap stick that she has used in the past, used multiple times and woke up this morning with lips swollen, stinging and red. Feels very chapped, she washed her lipid and started to use plain vaseline chap stick. She is feeling a little better now. No systemic symptoms.   Patient Active Problem List   Diagnosis Date Noted   Overweight 02/08/2022   Impingement syndrome of right shoulder region 11/06/2020   Statin myopathy 08/08/2020   Hypercholesteremia 08/08/2020   B12 deficiency 08/08/2020   Prediabetes 08/08/2020   Lumbar back pain with radiculopathy affecting right lower extremity 08/31/2018   Vitamin D deficiency 02/24/2017   BPPV (benign paroxysmal positional vertigo), unspecified laterality 08/25/2016   Peripheral vertigo 01/13/2015   Dyslipidemia 01/13/2015   Cold sore 01/13/2015    Gastroesophageal reflux disease without esophagitis 01/13/2015   Perennial allergic rhinitis 01/13/2015   Menopausal symptom 01/13/2015   History of concussion 01/18/2013    Social History   Tobacco Use   Smoking status: Never   Smokeless tobacco: Never  Substance Use Topics   Alcohol use: Yes    Alcohol/week: 0.0 standard drinks of alcohol    Comment: Consumes alcohol on Saturday     Current Outpatient Medications:    albuterol (VENTOLIN HFA) 108 (90 Base) MCG/ACT inhaler, Inhale 2 puffs into the lungs every 6 (six) hours as needed for wheezing or shortness of breath., Disp: 8 g, Rfl: 0   Cyanocobalamin (VITAMIN B-12) 5000 MCG SUBL, Place under the tongue daily., Disp: , Rfl:    estradiol (VIVELLE-DOT) 0.0375 MG/24HR, Place 1 patch onto the skin 2 (two) times a week., Disp: 8 patch, Rfl: 5   ezetimibe (ZETIA) 10 MG tablet, TAKE 1 TABLET BY MOUTH EVERY DAY, Disp: 90 tablet, Rfl: 1   levocetirizine (XYZAL) 5 MG tablet, Take 5 mg by mouth every evening., Disp: , Rfl:    Multiple Vitamin (MULTIVITAMIN) tablet, Take 1 tablet by mouth daily., Disp: , Rfl:    omeprazole (PRILOSEC) 40 MG capsule, TAKE 1 CAPSULE (40 MG TOTAL) BY MOUTH DAILY. (Patient taking differently: Take 40 mg by mouth daily as needed.), Disp: 90 capsule, Rfl: 1   valACYclovir (VALTREX) 1000 MG tablet, Take 1 tablet (1,000 mg total) by mouth 2 (two) times daily. (Patient taking differently: Take 1,000 mg by mouth 2 (two) times daily as needed.),  Disp: 30 tablet, Rfl: 0   Vitamin D, Ergocalciferol, (DRISDOL) 1.25 MG (50000 UNIT) CAPS capsule, Take 1 capsule (50,000 Units total) by mouth once a week., Disp: 12 capsule, Rfl: 1  Allergies  Allergen Reactions   Crestor [Rosuvastatin]     Cause muscle cramps, dizziness, headache and overall fatigue.   Atorvastatin     Muscle ache   Pravastatin Itching    dizziness   Venlafaxine Other (See Comments)    dizziness   Hydrocodone Nausea Only    I personally reviewed active  problem list, medication list, allergies with the patient/caregiver today.  ROS  Ten systems reviewed and is negative except as mentioned in HPI    Objective  Virtual encounter, vitals not obtained.  There is no height or weight on file to calculate BMI.  Nursing Note and Vital Signs reviewed.  Physical Exam  Awake, alert and oriented Lipid are erythematous and looks dry   Assessment & Plan  1. Lip edema (Primary)  Doing better already Stop all forms of cosmetics on her lips, except for plain vaseline , we will try Zoryve , discuss with dermatologist but we can also refer her to allergist if symptoms continues to occur  2. Allergic contact dermatitis due to cosmetics  - Roflumilast (ZORYVE) 0.15 % CREA; Apply 1 each topically daily at 12 noon.  Dispense: 60 g; Refill: 0   -Red flags and when to present for emergency care or RTC including fever >101.20F, chest pain, shortness of breath, new/worsening/un-resolving symptoms,  reviewed with patient at time of visit. Follow up and care instructions discussed and provided in AVS. - I discussed the assessment and treatment plan with the patient. The patient was provided an opportunity to ask questions and all were answered. The patient agreed with the plan and demonstrated an understanding of the instructions.  I provided 15 minutes of non-face-to-face time during this encounter.  Ruel Favors, MD

## 2023-09-04 ENCOUNTER — Other Ambulatory Visit: Payer: Self-pay | Admitting: Family Medicine

## 2023-09-04 DIAGNOSIS — N951 Menopausal and female climacteric states: Secondary | ICD-10-CM

## 2023-09-08 DIAGNOSIS — M65311 Trigger thumb, right thumb: Secondary | ICD-10-CM | POA: Diagnosis not present

## 2023-09-08 DIAGNOSIS — M79641 Pain in right hand: Secondary | ICD-10-CM | POA: Diagnosis not present

## 2023-11-11 ENCOUNTER — Encounter: Payer: Self-pay | Admitting: Family Medicine

## 2023-11-11 ENCOUNTER — Ambulatory Visit: Payer: Self-pay | Admitting: Family Medicine

## 2023-11-11 VITALS — BP 122/74 | HR 82 | Resp 16 | Ht 62.01 in | Wt 162.6 lb

## 2023-11-11 DIAGNOSIS — J4520 Mild intermittent asthma, uncomplicated: Secondary | ICD-10-CM | POA: Diagnosis not present

## 2023-11-11 DIAGNOSIS — G720 Drug-induced myopathy: Secondary | ICD-10-CM

## 2023-11-11 DIAGNOSIS — M5416 Radiculopathy, lumbar region: Secondary | ICD-10-CM

## 2023-11-11 DIAGNOSIS — T466X5D Adverse effect of antihyperlipidemic and antiarteriosclerotic drugs, subsequent encounter: Secondary | ICD-10-CM

## 2023-11-11 DIAGNOSIS — K219 Gastro-esophageal reflux disease without esophagitis: Secondary | ICD-10-CM | POA: Diagnosis not present

## 2023-11-11 DIAGNOSIS — E785 Hyperlipidemia, unspecified: Secondary | ICD-10-CM | POA: Diagnosis not present

## 2023-11-11 DIAGNOSIS — B001 Herpesviral vesicular dermatitis: Secondary | ICD-10-CM

## 2023-11-11 DIAGNOSIS — J302 Other seasonal allergic rhinitis: Secondary | ICD-10-CM

## 2023-11-11 DIAGNOSIS — F341 Dysthymic disorder: Secondary | ICD-10-CM

## 2023-11-11 DIAGNOSIS — R739 Hyperglycemia, unspecified: Secondary | ICD-10-CM

## 2023-11-11 DIAGNOSIS — J3089 Other allergic rhinitis: Secondary | ICD-10-CM

## 2023-11-11 DIAGNOSIS — E559 Vitamin D deficiency, unspecified: Secondary | ICD-10-CM

## 2023-11-11 MED ORDER — AZELASTINE-FLUTICASONE 137-50 MCG/ACT NA SUSP: 1.0000 | Freq: Two times a day (BID) | NASAL | 2 refills | Status: DC

## 2023-11-11 MED ORDER — VITAMIN D (ERGOCALCIFEROL) 1.25 MG (50000 UNIT) PO CAPS: 50000.0000 [IU] | ORAL_CAPSULE | ORAL | 1 refills | Status: DC

## 2023-11-11 MED ORDER — MONTELUKAST SODIUM 10 MG PO TABS: 10.0000 mg | ORAL_TABLET | Freq: Every day | ORAL | 1 refills | Status: DC

## 2023-11-11 MED ORDER — OMEPRAZOLE 40 MG PO CPDR: 40.0000 mg | DELAYED_RELEASE_CAPSULE | Freq: Every day | ORAL | 1 refills | Status: DC

## 2023-11-11 MED ORDER — ALBUTEROL SULFATE HFA 108 (90 BASE) MCG/ACT IN AERS: 2.0000 | INHALATION_SPRAY | Freq: Four times a day (QID) | RESPIRATORY_TRACT | 0 refills | Status: DC | PRN

## 2023-11-11 MED ORDER — VALACYCLOVIR HCL 1 G PO TABS: 1000.0000 mg | ORAL_TABLET | Freq: Two times a day (BID) | ORAL | 0 refills | Status: DC | PRN

## 2023-11-11 NOTE — Progress Notes (Signed)
 Name: Sara Randolph   MRN: [id number]    DOB: 09/23/1960   Date:11/11/2023       Progress Note  Subjective  Chief Complaint  Chief Complaint  Patient presents with   Medical Management of Chronic Issues   HPI   Dysthymia/Insomnia: seen in Dec 22  for marital problems, she was  feeling anxious and also having insomnia, we started her on Lexapro and also Trazodone. She stopped all her medications since marriage improved. She states decided to stop trazodone to see if improved her fatigue but continues to fee tired. She is feeling well at this time   B12 and Vitamin D deficiency: last levels were normal, continue supplements    RAD : since childhood, she uses albuterol prn She states this Spring has been bad and had to use it more often lately    History of Obesity and pre-diabetes:  BMI was 30.36 and weight was 166 lbs January 2023, she was already eating healthy and exercising and we started her on Ozempic since she was unable to get her BMI below 30 on her own. She responded well to the medications and is currently taking 1 mg dose , her weight today is 127.7 lbs  but insurance is no longer covering GLP-1 agonist. She is very frustrate about her weight gain, she decided to take estradiol because it was causing her to be tired and craving sugar   Hot flashes/Post menopausal syndrome : much worse since she stopped GLP-1 agonist. We gave her estradiol and hot flashes and sleep improved but she felt tired during the day and craving sweets. She decided to stop taking about one week ago and is doing better now    Dyslipidemia: she tried statins and caused myopathy, she has been compliant with zetia daily    GERD: currently only taking Omeprazole every other day now    Low back pain with radiculitis: going on for over one year, she has neurosurgeon Dr. Meredith Mody , she had a normal MRI in 2020 - unable to see the results. She states no recent flares, she is no longer taking gabapentin prn.   Taking Tylenol prn    Perennial AR: she states currently on Xyzal at night, loratadine in am, using otc flonase and still has symptoms such as itchy face, rhinorrhea, coughing, sneezing, post nasal drainage, ear pruritus   Right trigger thumb: under the care of ortho and had steroid injections , doing better now   Patient Active Problem List   Diagnosis Date Noted   Overweight 02/08/2022   Impingement syndrome of right shoulder region 11/06/2020   Statin myopathy 08/08/2020   Hypercholesteremia 08/08/2020   B12 deficiency 08/08/2020   Prediabetes 08/08/2020   Lumbar back pain with radiculopathy affecting right lower extremity 08/31/2018   Vitamin D deficiency 02/24/2017   BPPV (benign paroxysmal positional vertigo), unspecified laterality 08/25/2016   Peripheral vertigo 01/13/2015   Dyslipidemia 01/13/2015   Cold sore 01/13/2015   Gastroesophageal reflux disease without esophagitis 01/13/2015   Perennial allergic rhinitis 01/13/2015   Menopausal symptom 01/13/2015   History of concussion 01/18/2013    Past Surgical History:  Procedure Laterality Date   ABDOMINAL HYSTERECTOMY  1988   BREAST BIOPSY Right 07/04/2020   stereo bx, x-clip, FATTY BREAST TISSUE WITH LIMITED   CATARACT EXTRACTION W/PHACO Right 06/01/2023   Procedure: CATARACT EXTRACTION PHACO AND INTRAOCULAR LENS PLACEMENT (IOC) RIGHT 8.08 00:54.0;  Surgeon: Lockie Mola, MD;  Location: [facility];  Service: Ophthalmology;  Laterality: Right;  CATARACT EXTRACTION W/PHACO Left 06/15/2023   Procedure: CATARACT EXTRACTION PHACO AND INTRAOCULAR LENS PLACEMENT (IOC) LEFT 7.41 00:32.9;  Surgeon: Lockie Mola, MD;  Location: [facility];  Service: Ophthalmology;  Laterality: Left;   CHOLECYSTECTOMY     DILATION AND CURETTAGE OF UTERUS     ECTOPIC PREGNANCY SURGERY     RHINOPLASTY  1990   SALPINGECTOMY     TONSILLECTOMY  1969   WRIST SURGERY  1983    Family History  Problem Relation Age  of Onset   Alzheimer's disease Mother    Parkinsonism Father    Depression Sister    Osteoporosis Sister    Heart disease Brother    Heart attack Brother    Breast cancer Other        niece    Social History   Tobacco Use   Smoking status: Never   Smokeless tobacco: Never  Substance Use Topics   Alcohol use: Yes    Alcohol/week: 0.0 standard drinks of alcohol    Comment: Consumes alcohol on Saturday     Current Outpatient Medications:    albuterol (VENTOLIN HFA) 108 (90 Base) MCG/ACT inhaler, Inhale 2 puffs into the lungs every 6 (six) hours as needed for wheezing or shortness of breath., Disp: 8 g, Rfl: 0   Cyanocobalamin (VITAMIN B-12) 5000 MCG SUBL, Place under the tongue daily., Disp: , Rfl:    ezetimibe (ZETIA) 10 MG tablet, TAKE 1 TABLET BY MOUTH EVERY DAY, Disp: 90 tablet, Rfl: 1   levocetirizine (XYZAL) 5 MG tablet, Take 5 mg by mouth every evening., Disp: , Rfl:    Multiple Vitamin (MULTIVITAMIN) tablet, Take 1 tablet by mouth daily., Disp: , Rfl:    omeprazole (PRILOSEC) 40 MG capsule, TAKE 1 CAPSULE (40 MG TOTAL) BY MOUTH DAILY. (Patient taking differently: Take 40 mg by mouth daily as needed.), Disp: 90 capsule, Rfl: 1   valACYclovir (VALTREX) 1000 MG tablet, Take 1 tablet (1,000 mg total) by mouth 2 (two) times daily. (Patient taking differently: Take 1,000 mg by mouth 2 (two) times daily as needed.), Disp: 30 tablet, Rfl: 0   Vitamin D, Ergocalciferol, (DRISDOL) 1.25 MG (50000 UNIT) CAPS capsule, Take 1 capsule (50,000 Units total) by mouth once a week., Disp: 12 capsule, Rfl: 1   estradiol (VIVELLE-DOT) 0.0375 MG/24HR, PLACE 1 PATCH ONTO THE SKIN 2 TIMES A WEEK. (Patient not taking: Reported on 11/11/2023), Disp: 8 patch, Rfl: 2   Roflumilast (ZORYVE) 0.15 % CREA, Apply 1 each topically daily at 12 noon. (Patient not taking: Reported on 11/11/2023), Disp: 60 g, Rfl: 0  Allergies  Allergen Reactions   Crestor [Rosuvastatin]     Cause muscle cramps, dizziness,  headache and overall fatigue.   Atorvastatin     Muscle ache   Pravastatin Itching    dizziness   Venlafaxine Other (See Comments)    dizziness   Hydrocodone Nausea Only    I personally reviewed active problem list, medication list, allergies, family history with the patient/caregiver today.   ROS  Ten systems reviewed and is negative except as mentioned in HPI    Objective Physical Exam Constitutional: Patient appears well-developed and well-nourished.  No distress.  HEENT: head atraumatic, normocephalic, pupils equal and reactive to light, neck supple, throat within normal limits Cardiovascular: Normal rate, regular rhythm and normal heart sounds.  No murmur heard. No BLE edema. Pulmonary/Chest: Effort normal and breath sounds normal. No respiratory distress. Abdominal: Soft.  There is no tenderness. Psychiatric: Patient has a normal mood  and affect. behavior is normal. Judgment and thought content normal.   Vitals:   11/11/23 0832  BP: 122/74  Pulse: 82  Resp: 16  SpO2: 94%  Weight: 162 lb 9.6 oz (73.8 kg)  Height: 5' 2.01" (1.575 m)    Body mass index is 29.73 kg/m.   PHQ2/9:    11/11/2023    8:23 AM 08/29/2023   12:57 PM 05/13/2023    8:36 AM 11/10/2022   11:29 AM 08/11/2022    8:34 AM  Depression screen PHQ 2/9  Decreased Interest 0 0 0 0 0  Down, Depressed, Hopeless 0 0 0 0 0  PHQ - 2 Score 0 0 0 0 0  Altered sleeping 0 0 3 0 2  Tired, decreased energy 0 0 3 0 0  Change in appetite 0 0 0 0 0  Feeling bad or failure about yourself  0 0 0 0 0  Trouble concentrating 0 0 0 0 0  Moving slowly or fidgety/restless 0 0 0 0 0  Suicidal thoughts 0 0 0 0 0  PHQ-9 Score 0 0 6 0 2  Difficult doing work/chores Not difficult at all Not difficult at all   Not difficult at all    phq 9 is negative  Fall Risk:    11/11/2023    8:23 AM 08/29/2023   12:57 PM 05/13/2023    8:36 AM 11/10/2022   11:29 AM 08/11/2022    8:33 AM  Fall Risk   Falls in the past year? 0 0 0  0 0  Number falls in past yr: 0 0 0 0   Injury with Fall? 0 0 0 0   Risk for fall due to : No Fall Risks No Fall Risks No Fall Risks No Fall Risks   Follow up Falls prevention discussed;Education provided;Falls evaluation completed Falls prevention discussed;Education provided;Falls evaluation completed Falls prevention discussed Falls prevention discussed Falls prevention discussed;Education provided;Falls evaluation completed     Assessment & Plan  1. Dyslipidemia (Primary)  Zetia  2. Hyperglycemia  Recheck it yearly   3. Perennial allergic rhinitis with seasonal variation  - Azelastine-Fluticasone 137-50 MCG/ACT SUSP; Place 1 spray into the nose every 12 (twelve) hours.  Dispense: 23 g; Refill: 2 - montelukast (SINGULAIR) 10 MG tablet; Take 1 tablet (10 mg total) by mouth at bedtime.  Dispense: 90 tablet; Refill: 1  4. Mild intermittent reactive airway disease without complication  - albuterol (VENTOLIN HFA) 108 (90 Base) MCG/ACT inhaler; Inhale 2 puffs into the lungs every 6 (six) hours as needed for wheezing or shortness of breath.  Dispense: 8 g; Refill: 0 - montelukast (SINGULAIR) 10 MG tablet; Take 1 tablet (10 mg total) by mouth at bedtime.  Dispense: 90 tablet; Refill: 1  5. Gastroesophageal reflux disease without esophagitis  Taking PPI  6. Dysthymia  Frustrate about weight gain and stressed about husband that had surgery and dog that has seizures  7. Lumbar back pain with radiculopathy affecting right lower extremity  Doing well now  8. Statin myopathy   9. Vitamin D deficiency  - Vitamin D, Ergocalciferol, (DRISDOL) 1.25 MG (50000 UNIT) CAPS capsule; Take 1 capsule (50,000 Units total) by mouth once a week.  Dispense: 12 capsule; Refill: 1  10. Cold sore  - valACYclovir (VALTREX) 1000 MG tablet; Take 1 tablet (1,000 mg total) by mouth 2 (two) times daily as needed.  Dispense: 30 tablet; Refill: 0

## 2023-12-27 HISTORY — PX: OTHER SURGICAL HISTORY: SHX169

## 2024-01-15 ENCOUNTER — Encounter (HOSPITAL_COMMUNITY): Payer: Self-pay | Admitting: Emergency Medicine

## 2024-01-15 ENCOUNTER — Ambulatory Visit (HOSPITAL_COMMUNITY)
Admission: EM | Admit: 2024-01-15 | Discharge: 2024-01-15 | Disposition: A | Discharge status: Home / Self Care | Attending: Family Medicine | Admitting: Family Medicine

## 2024-01-15 ENCOUNTER — Other Ambulatory Visit: Payer: Self-pay

## 2024-01-15 DIAGNOSIS — W5501XA Bitten by cat, initial encounter: Secondary | ICD-10-CM | POA: Diagnosis not present

## 2024-01-15 DIAGNOSIS — S60946A Unspecified superficial injury of right little finger, initial encounter: Secondary | ICD-10-CM

## 2024-01-15 MED ORDER — TETANUS-DIPHTH-ACELL PERTUSSIS 5-2.5-18.5 LF-MCG/0.5 IM SUSY
0.5000 mL | PREFILLED_SYRINGE | Freq: Once | INTRAMUSCULAR | Status: AC
  Administered 2024-01-15: 0.5 mL via INTRAMUSCULAR

## 2024-01-15 MED ORDER — AMOXICILLIN-POT CLAVULANATE 875-125 MG PO TABS: 1.0000 | ORAL_TABLET | Freq: Two times a day (BID) | ORAL | 0 refills | Status: AC

## 2024-01-15 MED ORDER — TETANUS-DIPHTH-ACELL PERTUSSIS 5-2.5-18.5 LF-MCG/0.5 IM SUSY
PREFILLED_SYRINGE | INTRAMUSCULAR | Status: AC
  Filled 2024-01-15: qty 0.5

## 2024-01-15 NOTE — ED Triage Notes (Signed)
 Pt reports she got bite by her cat today. Pt reports cat wasn't vaccinated. She got cat from a rescue place.

## 2024-01-15 NOTE — ED Provider Notes (Signed)
 [facility]    CSN: [id number] Arrival date & time: 01/15/24  1212      History   Chief Complaint Chief Complaint  Patient presents with   Animal Bite    HPI Sara Randolph is a 63 y.o. female.   The history is provided by the patient. No language interpreter was used.  Animal Bite Contact animal:  Cat The patient was bitten by her rescued Cat today. The Cat was being attacked by her dog, and while she was trying to save the Cat, the Cat bit her. Cat was not previously ill and completed only two of the three doses of rabies vaccination per the patient. She has an injury to her right  5th finger, which is painful about about 2/10. They called Animal Control,, who advised them to would return the the call tomorrow. The Cat is currently in their refrigerator. The patient stated that she is certain the risk for rabies is low after we discussed rabies vaccination. She is not up to date with her Tdap.   Past Medical History:  Diagnosis Date   Allergy    Anxiety    Arthritis    hands   Asthma    Concussion with no loss of consciousness 02/28/2013   GERD (gastroesophageal reflux disease)    Hemorrhoids    High cholesterol    Lung nodule    Seborrheic keratosis     Patient Active Problem List   Diagnosis Date Noted   Trigger thumb of right hand 08/02/2023   Overweight 02/08/2022   Impingement syndrome of right shoulder region 11/06/2020   Statin myopathy 08/08/2020   Hypercholesteremia 08/08/2020   B12 deficiency 08/08/2020   Prediabetes 08/08/2020   Lumbar back pain with radiculopathy affecting right lower extremity 08/31/2018   Vitamin D  deficiency 02/24/2017   BPPV (benign paroxysmal positional vertigo), unspecified laterality 08/25/2016   Peripheral vertigo 01/13/2015   Dyslipidemia 01/13/2015   Cold sore 01/13/2015   Gastroesophageal reflux disease without esophagitis 01/13/2015   Perennial allergic rhinitis 01/13/2015   Menopausal symptom  01/13/2015   History of concussion 01/18/2013    Past Surgical History:  Procedure Laterality Date   ABDOMINAL HYSTERECTOMY  1988   BREAST BIOPSY Right 07/04/2020   stereo bx, x-clip, FATTY BREAST TISSUE WITH LIMITED   CATARACT EXTRACTION W/PHACO Right 06/01/2023   Procedure: CATARACT EXTRACTION PHACO AND INTRAOCULAR LENS PLACEMENT (IOC) RIGHT 8.08 00:54.0;  Surgeon: Annell Kidney, MD;  Location: [facility];  Service: Ophthalmology;  Laterality: Right;   CATARACT EXTRACTION W/PHACO Left 06/15/2023   Procedure: CATARACT EXTRACTION PHACO AND INTRAOCULAR LENS PLACEMENT (IOC) LEFT 7.41 00:32.9;  Surgeon: Annell Kidney, MD;  Location: [facility];  Service: Ophthalmology;  Laterality: Left;   CHOLECYSTECTOMY     DILATION AND CURETTAGE OF UTERUS     ECTOPIC PREGNANCY SURGERY     RHINOPLASTY  1990   SALPINGECTOMY     TONSILLECTOMY  1969   WRIST SURGERY  1983    OB History   No obstetric history on file.      Home Medications    Prior to Admission medications   Medication Sig Start Date End Date Taking? Authorizing Provider  amoxicillin -clavulanate (AUGMENTIN ) 875-125 MG tablet Take 1 tablet by mouth every 12 (twelve) hours for 10 days. 01/15/24 01/25/24 Yes Arn Lane, MD  albuterol  (VENTOLIN  HFA) 108 (90 Base) MCG/ACT inhaler Inhale 2 puffs into the lungs every 6 (six) hours as needed for wheezing or shortness of breath. 11/11/23  Sowles, Krichna, MD  Azelastine -Fluticasone  137-50 MCG/ACT SUSP Place 1 spray into the nose every 12 (twelve) hours. 11/11/23   Sowles, Krichna, MD  Cyanocobalamin  (VITAMIN B-12) 5000 MCG SUBL Place under the tongue daily.    [provider]  ezetimibe  (ZETIA ) 10 MG tablet TAKE 1 TABLET BY MOUTH EVERY DAY 08/18/23   Ava Lei, Krichna, MD  levocetirizine (XYZAL) 5 MG tablet Take 5 mg by mouth every evening.    [provider]  montelukast  (SINGULAIR ) 10 MG tablet Take 1 tablet (10 mg total) by mouth at  bedtime. 11/11/23   Sowles, Krichna, MD  Multiple Vitamin (MULTIVITAMIN) tablet Take 1 tablet by mouth daily.    [provider]  omeprazole  (PRILOSEC) 40 MG capsule Take 1 capsule (40 mg total) by mouth daily. 11/11/23   Sowles, Krichna, MD  valACYclovir  (VALTREX ) 1000 MG tablet Take 1 tablet (1,000 mg total) by mouth 2 (two) times daily as needed. 11/11/23   Sowles, Krichna, MD  Vitamin D , Ergocalciferol , (DRISDOL ) 1.25 MG (50000 UNIT) CAPS capsule Take 1 capsule (50,000 Units total) by mouth once a week. 11/11/23   Sowles, Krichna, MD    Family History Family History  Problem Relation Age of Onset   Alzheimer's disease Mother    Parkinsonism Father    Depression Sister    Osteoporosis Sister    Heart disease Brother    Heart attack Brother    Breast cancer Other        niece    Social History Social History   Tobacco Use   Smoking status: Never   Smokeless tobacco: Never  Vaping Use   Vaping status: Never Used  Substance Use Topics   Alcohol use: Yes    Alcohol/week: 0.0 standard drinks of alcohol    Comment: Consumes alcohol on Saturday   Drug use: No     Allergies   Crestor  [rosuvastatin ], Atorvastatin , Pravastatin, Venlafaxine , and Hydrocodone    Review of Systems Review of Systems  All other systems reviewed and are negative.    Physical Exam Triage Vital Signs ED Triage Vitals  Encounter Vitals Group     BP 01/15/24 1229 135/69     Girls Systolic BP Percentile --      Girls Diastolic BP Percentile --      Boys Systolic BP Percentile --      Boys Diastolic BP Percentile --      Pulse Rate 01/15/24 1229 80     Resp 01/15/24 1229 16     Temp 01/15/24 1229 98 F (36.7 C)     Temp Source 01/15/24 1229 Oral     SpO2 01/15/24 1229 98 %     Weight --      Height --      Head Circumference --      Peak Flow --      Pain Score 01/15/24 1230 8     Pain Loc --      Pain Education --      Exclude from Growth Chart --    No data found.  Updated  Vital Signs BP 135/69 (BP Location: Right Arm)   Pulse 80   Temp 98 F (36.7 C) (Oral)   Resp 16   SpO2 98%   Visual Acuity Right Eye Distance:   Left Eye Distance:   Bilateral Distance:    Right Eye Near:   Left Eye Near:    Bilateral Near:     Physical Exam Vitals and nursing note reviewed.  Constitutional:  Appearance: She is not ill-appearing.   Cardiovascular:     Rate and Rhythm: Normal rate and regular rhythm.     Heart sounds: Normal heart sounds. No murmur heard. Pulmonary:     Effort: Pulmonary effort is normal. No respiratory distress.     Breath sounds: Normal breath sounds. No wheezing.   Skin:    Comments: 3 small punctured wound of her right 5th digit   Neurological:     Mental Status: She is alert.      UC Treatments / Results  Labs (all labs ordered are listed, but only abnormal results are displayed) Labs Reviewed - No data to display  EKG   Radiology No results found.  Procedures Procedures (including critical care time)  Medications Ordered in UC Medications  Tdap (BOOSTRIX) injection 0.5 mL (0.5 mLs Intramuscular Given 01/15/24 1308)    Initial Impression / Assessment and Plan / UC Course  I have reviewed the triage vital signs and the nursing notes.  Pertinent labs & imaging results that were available during my care of the patient were reviewed by me and considered in my medical decision making (see chart for details).  Clinical Course as of 01/15/24 1313  Sun Jan 15, 2024  1311 Wound dressing and Tdap given today She affirm that the risk for rabies is low for her cat as she completed 2 of 3 doses of rabies vaccination Risk benefit conversation regarding rabies vaccination discussed They have the animal in possesion and will contact animal control tomorrow for rabies testing She will return for vaccination if this is positive Use Tyelnol as needed for pain and I escribed Augmentin  to prevent infection [KE]    Clinical  Course User Index [KE] Arn Lane, MD     Final Clinical Impressions(s) / UC Diagnoses   Final diagnoses:  None     Discharge Instructions      It was nice seeing you today. I'm sorry to hear about your animal bite injury. We gave your tetanus shot today and sent in antibiotics to prevent infection. Use Tylenol  or Ibuprofen as needed for pain relief, and follow up with your primary care physician (PCP) as needed. We discussed rabies vaccination and its indications. We are differing on this for now. As discussed, please keep the animals and have animal control assess for rabies. If positive, please return to the UC for rabies vaccination. Stay well.      ED Prescriptions     Medication Sig Dispense Auth. Provider   amoxicillin -clavulanate (AUGMENTIN ) 875-125 MG tablet Take 1 tablet by mouth every 12 (twelve) hours for 10 days. 20 tablet Arn Lane, MD      PDMP not reviewed this encounter.   Arn Lane, MD 01/15/24 [phone]

## 2024-01-15 NOTE — Discharge Instructions (Addendum)
 It was nice seeing you today. I'm sorry to hear about your animal bite injury. We gave your tetanus shot today and sent in antibiotics to prevent infection. Use Tylenol  or Ibuprofen as needed for pain relief, and follow up with your primary care physician (PCP) as needed. We discussed rabies vaccination and its indications. We are differing on this for now. As discussed, please keep the animals and have animal control assess for rabies. If positive, please return to the UC for rabies vaccination. Stay well.

## 2024-02-24 ENCOUNTER — Encounter: Payer: Self-pay | Admitting: Family Medicine

## 2024-02-24 ENCOUNTER — Ambulatory Visit: Admitting: Family Medicine

## 2024-02-24 VITALS — BP 118/78 | HR 60 | Resp 16 | Ht 62.0 in | Wt 167.0 lb

## 2024-02-24 DIAGNOSIS — Z0001 Encounter for general adult medical examination with abnormal findings: Secondary | ICD-10-CM

## 2024-02-24 DIAGNOSIS — R001 Bradycardia, unspecified: Secondary | ICD-10-CM | POA: Diagnosis not present

## 2024-02-24 DIAGNOSIS — Z23 Encounter for immunization: Secondary | ICD-10-CM

## 2024-02-24 DIAGNOSIS — Z1231 Encounter for screening mammogram for malignant neoplasm of breast: Secondary | ICD-10-CM

## 2024-02-24 DIAGNOSIS — Z13820 Encounter for screening for osteoporosis: Secondary | ICD-10-CM

## 2024-02-24 DIAGNOSIS — Z0000 Encounter for general adult medical examination without abnormal findings: Secondary | ICD-10-CM | POA: Diagnosis not present

## 2024-02-24 DIAGNOSIS — Z1211 Encounter for screening for malignant neoplasm of colon: Secondary | ICD-10-CM

## 2024-02-24 DIAGNOSIS — H8111 Benign paroxysmal vertigo, right ear: Secondary | ICD-10-CM

## 2024-02-24 NOTE — Progress Notes (Signed)
 Subjective:   Sara Randolph is a 63 y.o. female who presents for an Initial Medicare Annual Wellness Visit.  Visit Complete: In person  Patient Medicare AWV questionnaire was completed by the patient on 02/24/2024 ; I have confirmed that all information answered by patient is correct and no changes since this date.  Cardiac Risk Factors include: advanced age (>35men, >74 women)     Objective:    Today's Vitals   02/24/24 1002  BP: 118/78  Pulse: 60  Resp: 16  SpO2: 96%  Weight: 167 lb (75.8 kg)  Height: 5' 2 (1.575 m)   Body mass index is 30.54 kg/m.     02/24/2024   10:12 AM 06/15/2023   10:55 AM 06/01/2023    7:50 AM 04/28/2018    9:01 AM 02/22/2017    8:17 AM 08/25/2016    8:57 AM 11/06/2015    8:48 AM  Advanced Directives  Does Patient Have a Medical Advance Directive? Yes Yes Yes No  No  Yes  No   Type of Estate agent of [place];Living will Healthcare Power of [place];Living will Living will   Living will;Healthcare Power of Attorney   Does patient want to make changes to medical advance directive?   No - Patient declined      Copy of Healthcare Power of Attorney in Chart?  Yes - validated most recent copy scanned in chart (See row information)    Yes    Would patient like information on creating a medical advance directive?    Yes (MAU/Ambulatory/Procedural Areas - Information given)    No - patient declined information      Data saved with a previous flowsheet row definition    Current Medications (verified) Outpatient Encounter Medications as of 02/24/2024  Medication Sig   albuterol  (VENTOLIN  HFA) 108 (90 Base) MCG/ACT inhaler Inhale 2 puffs into the lungs every 6 (six) hours as needed for wheezing or shortness of breath.   Azelastine -Fluticasone  137-50 MCG/ACT SUSP Place 1 spray into the nose every 12 (twelve) hours.   Cyanocobalamin  (VITAMIN B-12) 5000 MCG SUBL Place under the tongue daily.   ezetimibe  (ZETIA ) 10 MG tablet TAKE  1 TABLET BY MOUTH EVERY DAY   levocetirizine (XYZAL) 5 MG tablet Take 5 mg by mouth every evening.   montelukast  (SINGULAIR ) 10 MG tablet Take 1 tablet (10 mg total) by mouth at bedtime.   Multiple Vitamin (MULTIVITAMIN) tablet Take 1 tablet by mouth daily.   omeprazole  (PRILOSEC) 40 MG capsule Take 1 capsule (40 mg total) by mouth daily.   valACYclovir  (VALTREX ) 1000 MG tablet Take 1 tablet (1,000 mg total) by mouth 2 (two) times daily as needed.   Vitamin D , Ergocalciferol , (DRISDOL ) 1.25 MG (50000 UNIT) CAPS capsule Take 1 capsule (50,000 Units total) by mouth once a week.   No facility-administered encounter medications on file as of 02/24/2024.    Allergies (verified) Crestor  [rosuvastatin ], Atorvastatin , Pravastatin, Venlafaxine , and Hydrocodone    History: Past Medical History:  Diagnosis Date   Allergy    Anxiety    Arthritis    hands   Asthma    Concussion with no loss of consciousness 02/28/2013   Depression    GERD (gastroesophageal reflux disease)    Hemorrhoids    High cholesterol    Lung nodule    Seborrheic keratosis    Past Surgical History:  Procedure Laterality Date   ABDOMINAL HYSTERECTOMY  1988   BREAST BIOPSY Right 07/04/2020   stereo bx, x-clip, FATTY  BREAST TISSUE WITH LIMITED   CATARACT EXTRACTION W/PHACO Right 06/01/2023   Procedure: CATARACT EXTRACTION PHACO AND INTRAOCULAR LENS PLACEMENT (IOC) RIGHT 8.08 00:54.0;  Surgeon: Mittie Gaskin, MD;  Location: [facility];  Service: Ophthalmology;  Laterality: Right;   CATARACT EXTRACTION W/PHACO Left 06/15/2023   Procedure: CATARACT EXTRACTION PHACO AND INTRAOCULAR LENS PLACEMENT (IOC) LEFT 7.41 00:32.9;  Surgeon: Mittie Gaskin, MD;  Location: [facility];  Service: Ophthalmology;  Laterality: Left;   CHOLECYSTECTOMY     COSMETIC SURGERY  1990   Nose - lip   DILATION AND CURETTAGE OF UTERUS     ECTOPIC PREGNANCY SURGERY     RHINOPLASTY  1990   SALPINGECTOMY      TONSILLECTOMY  1969   WRIST SURGERY  1983   Family History  Problem Relation Age of Onset   Alzheimer's disease Mother    Arthritis Mother    COPD Mother    Parkinsonism Father    Diabetes Father    Vision loss Father    Depression Sister    Osteoporosis Sister    Anxiety disorder Sister    Obesity Sister    Heart disease Brother    Heart attack Brother    ADD / ADHD Brother    Breast cancer Other        niece   Social History   Socioeconomic History   Marital status: Married    Spouse name: Buddy   Number of children: 2   Years of education: Not on file   Highest education level: 12th grade  Occupational History   Occupation: Dietitian  Tobacco Use   Smoking status: Never   Smokeless tobacco: Never  Vaping Use   Vaping status: Never Used  Substance and Sexual Activity   Alcohol use: Yes    Alcohol/week: 0.0 standard drinks of alcohol    Comment: Consumes alcohol on Saturday   Drug use: No   Sexual activity: Yes    Partners: Male  Other Topics Concern   Not on file  Social History Narrative   Not on file   Social Drivers of Health   Financial Resource Strain: Low Risk  (02/24/2024)   Overall Financial Resource Strain (CARDIA)    Difficulty of Paying Living Expenses: Not hard at all  Food Insecurity: No Food Insecurity (02/24/2024)   Hunger Vital Sign    Worried About Running Out of Food in the Last Year: Never true    Ran Out of Food in the Last Year: Never true  Transportation Needs: No Transportation Needs (02/24/2024)   PRAPARE - Administrator, Civil Service (Medical): No    Lack of Transportation (Non-Medical): No  Physical Activity: Inactive (02/24/2024)   Exercise Vital Sign    Days of Exercise per Week: 0 days    Minutes of Exercise per Session: 0 min  Stress: No Stress Concern Present (02/24/2024)   [employer] of Occupational Health - Occupational Stress Questionnaire    Feeling of Stress: Only a little  Social Connections:  Moderately Isolated (02/24/2024)   Social Connection and Isolation Panel    Frequency of Communication with Friends and Family: More than three times a week    Frequency of Social Gatherings with Friends and Family: Three times a week    Attends Religious Services: Never    Active Member of Clubs or Organizations: No    Attends Banker Meetings: Never    Marital Status: Married    Tobacco Counseling Counseling given: Not  Answered   Clinical Intake:    Vertigo - right side - she has a personal history of BPPV - she had [place] in the past done by [facility], however they no longer do vestibular treatment. She states symptoms started shortly after her last visit in late April , after she took singulair , it improved a little once she stopped medication but getting worse again.  She states turning to the right in bed and looking up , the room spins, denies associated nausea or vomiting. No headaches , hearing loss but she has stable tinnitus on both sides     Activities of Daily Living    02/24/2024    9:59 AM 06/15/2023   10:55 AM  In your present state of health, do you have any difficulty performing the following activities:  Hearing? 0 0  Vision? 0 0  Difficulty concentrating or making decisions? 0 0  Walking or climbing stairs? 0   Dressing or bathing? 0   Doing errands, shopping? 0   Preparing Food and eating ? N   Using the Toilet? N   In the past six months, have you accidently leaked urine? N   Do you have problems with loss of bowel control? N   Managing your Medications? N   Managing your Finances? N   Housekeeping or managing your Housekeeping? N     Patient Care Team: Latash Nouri, MD as PCP - General (Family Medicine)  Indicate any recent Medical Services you may have received from other than [facility] providers in the past year (date may be approximate).     Assessment:   This is a routine wellness examination for [place].  Hearing/Vision  screen Hearing Screening   500Hz  1000Hz  2000Hz  4000Hz   Right ear Pass Pass Pass Pass  Left ear Pass Pass Pass Pass   Vision Screening   Right eye Left eye Both eyes  Without correction 20/30 20/30 20/30   With correction        Goals Addressed   None    Depression Screen    02/24/2024   10:11 AM 11/11/2023    8:23 AM 08/29/2023   12:57 PM 05/13/2023    8:36 AM 11/10/2022   11:29 AM 08/11/2022    8:34 AM 02/08/2022    8:57 AM  PHQ 2/9 Scores  PHQ - 2 Score 0 0 0 0 0 0 0  PHQ- 9 Score 0 0 0 6 0 2 0    Fall Risk    02/24/2024    9:59 AM 11/11/2023    8:23 AM 08/29/2023   12:57 PM 05/13/2023    8:36 AM 11/10/2022   11:29 AM  Fall Risk   Falls in the past year? 0 0 0 0 0  Number falls in past yr: 0 0 0 0 0  Injury with Fall? 0 0 0 0 0  Risk for fall due to : No Fall Risks No Fall Risks No Fall Risks No Fall Risks No Fall Risks  Follow up Falls prevention discussed Falls prevention discussed;Education provided;Falls evaluation completed Falls prevention discussed;Education provided;Falls evaluation completed Falls prevention discussed Falls prevention discussed    MEDICARE RISK AT HOME: Medicare Risk at Home Any stairs in or around the home?: Yes If so, are there any without handrails?: Yes Home free of loose throw rugs in walkways, pet beds, electrical cords, etc?: Yes Adequate lighting in your home to reduce risk of falls?: Yes Life alert?: No Use of a cane, walker or w/c?: No Grab  bars in the bathroom?: No Shower chair or bench in shower?: No Elevated toilet seat or a handicapped toilet?: No  TIMED UP AND GO:  Was the test performed? Yes  Time get up and go normal  Gait steady and fast with assistive device    Cognitive Function:        02/24/2024   10:14 AM  6CIT Screen  What Year? 0 points  What month? 0 points  What time? 0 points  Count back from 20 0 points  Months in reverse 0 points  Repeat phrase 0 points  Total Score 0 points     Immunizations Immunization History  Administered Date(s) Administered    sv, Bivalent, Protein Subunit Rsvpref,pf [place]) 05/14/2023   Influenza Inj Mdck Quad Pf 04/26/2019, 06/01/2022   Influenza, Seasonal, Injecte, Preservative Fre 05/14/2023   Influenza,inj,Quad PF,6+ Mos 05/03/2017, 06/20/2018, 04/28/2020   Influenza,inj,quad, With Preservative 05/02/2020   Influenza-Unspecified 04/30/2015, 08/14/2016, 05/04/2019, 05/11/2021, 06/01/2022   PFIZER(Purple Top)SARS-COV-2 Vaccination 10/01/2019, 10/22/2019, 07/01/2020   PNEUMOCOCCAL CONJUGATE-20 02/24/2024   Pfizer Covid-19 Vaccine Bivalent Booster 48yrs & up 06/18/2021   Pneumococcal Polysaccharide-23 06/01/2022   Tdap 01/03/2012, 01/15/2024   Zoster Recombinant(Shingrix) 04/02/2020, 05/28/2020    TDAP status: Up to date  Flu Vaccine status: Up to date  Pneumococcal vaccine status: Up to date  Covid-19 vaccine status: Completed vaccines  Qualifies for Shingles Vaccine? Yes   Zostavax completed Yes   Shingrix Completed?: Yes  Screening Tests Health Maintenance  Topic Date Due   COVID-19 Vaccine (5 - 2024-25 season) 03/11/2024 (Originally 04/03/2023)   Colonoscopy  11/10/2024 (Originally 10/09/2023)   HIV Screening  07/14/2029 (Originally 11/02/1975)   INFLUENZA VACCINE  03/02/2024   Medicare Annual Wellness (AWV)  02/23/2025   MAMMOGRAM  07/21/2025   Pneumococcal Vaccine: 50+ Years  Completed   DEXA SCAN  Completed   Hepatitis C Screening  Completed   Zoster Vaccines- Shingrix  Completed   Hepatitis B Vaccines  Aged Out   HPV VACCINES  Aged Out   Meningococcal B Vaccine  Aged Out   DTaP/Tdap/Td  Discontinued    Health Maintenance  There are no preventive care reminders to display for this patient.  Colonoscopy due   Mammogram status: Completed 07/2023 . Repeat every year  Bone Density status: Ordered  . Pt provided with contact info and advised to call to schedule appt.  Lung Cancer Screening: (Low Dose CT  Chest recommended if Age 81-80 years, 20 pack-year currently smoking OR have quit w/in 15years.) does not qualify.    Additional Screening:  Hepatitis C Screening: does qualify; Completed 11/2013  Vision Screening: Recommended annual ophthalmology exams for early detection of glaucoma and other disorders of the eye. Is the patient up to date with their annual eye exam?  Yes  Who is the provider or what is the name of the office in which the patient attends annual eye exams? [facility]   Dental Screening: Recommended annual dental exams for proper oral hygiene  Community Resource Referral / Chronic Care Management: CRR required this visit?  No   CCM required this visit?  No    Plan:    1. Welcome to Medicare preventive visit (Primary)  - EKG 12-Lead - Hearing screening; Future - Visual acuity screening - Ambulatory referral to Gastroenterology - DG Bone Density; Future - MM Digital Screening; Future  2. Immunization due  - Pneumococcal conjugate vaccine 20-valent (Prevnar 20)  3. Bradycardia  On her fibit heart rate between 51-121, average  66 EKG today heart rate 51 but no symptoms   4. BPPV (benign paroxysmal positional vertigo), right  - Ambulatory referral to ENT  5. Colon cancer screening  - Ambulatory referral to Gastroenterology  6. Screening mammogram for breast cancer  - MM Digital Screening; Future  7. Osteoporosis screening  - DG Bone Density; Future  I have personally reviewed and noted the following in the patient's chart:   Medical and social history Use of alcohol, tobacco or illicit drugs  Current medications and supplements including opioid prescriptions. Patient is not currently taking opioid prescriptions. Functional ability and status Nutritional status Physical activity Advanced directives List of other physicians Hospitalizations, surgeries, and ER visits in previous 12 months Vitals Screenings to include cognitive,  depression, and falls Referrals and appointments  In addition, I have reviewed and discussed with patient certain preventive protocols, quality metrics, and best practice recommendations. A written personalized care plan for preventive services as well as general preventive health recommendations were provided to patient.     Trace Wirick F Staria Birkhead, MD   02/24/2024   After Visit Summary: (MyChart) Due to this being a telephonic visit, the after visit summary with patients personalized plan was offered to patient via MyChart

## 2024-03-13 ENCOUNTER — Other Ambulatory Visit: Payer: Self-pay | Admitting: Family Medicine

## 2024-03-13 DIAGNOSIS — Z1231 Encounter for screening mammogram for malignant neoplasm of breast: Secondary | ICD-10-CM

## 2024-03-27 ENCOUNTER — Other Ambulatory Visit: Payer: Self-pay | Admitting: Family Medicine

## 2024-03-27 DIAGNOSIS — J302 Other seasonal allergic rhinitis: Secondary | ICD-10-CM

## 2024-03-27 DIAGNOSIS — J4520 Mild intermittent asthma, uncomplicated: Secondary | ICD-10-CM

## 2024-04-16 ENCOUNTER — Ambulatory Visit: Payer: Self-pay | Admitting: Family Medicine

## 2024-04-16 ENCOUNTER — Ambulatory Visit
Admission: RE | Admit: 2024-04-16 | Discharge: 2024-04-16 | Disposition: A | Discharge status: Home / Self Care | Source: Ambulatory Visit | Attending: Family Medicine | Admitting: Family Medicine

## 2024-04-16 DIAGNOSIS — Z13820 Encounter for screening for osteoporosis: Secondary | ICD-10-CM | POA: Insufficient documentation

## 2024-04-16 DIAGNOSIS — Z1231 Encounter for screening mammogram for malignant neoplasm of breast: Secondary | ICD-10-CM | POA: Insufficient documentation

## 2024-04-16 DIAGNOSIS — Z780 Asymptomatic menopausal state: Secondary | ICD-10-CM | POA: Diagnosis not present

## 2024-04-16 DIAGNOSIS — Z0000 Encounter for general adult medical examination without abnormal findings: Secondary | ICD-10-CM | POA: Insufficient documentation

## 2024-04-25 ENCOUNTER — Ambulatory Visit: Payer: Self-pay

## 2024-04-25 DIAGNOSIS — K635 Polyp of colon: Secondary | ICD-10-CM | POA: Diagnosis not present

## 2024-04-25 DIAGNOSIS — Z1211 Encounter for screening for malignant neoplasm of colon: Secondary | ICD-10-CM | POA: Diagnosis not present

## 2024-04-26 ENCOUNTER — Other Ambulatory Visit: Payer: Self-pay | Admitting: Family Medicine

## 2024-04-26 DIAGNOSIS — E559 Vitamin D deficiency, unspecified: Secondary | ICD-10-CM

## 2024-05-14 ENCOUNTER — Ambulatory Visit (INDEPENDENT_AMBULATORY_CARE_PROVIDER_SITE_OTHER): Admitting: Family Medicine

## 2024-05-14 ENCOUNTER — Encounter: Payer: Self-pay | Admitting: Family Medicine

## 2024-05-14 VITALS — BP 118/74 | HR 84 | Resp 16 | Ht 62.0 in | Wt 166.1 lb

## 2024-05-14 DIAGNOSIS — T466X5A Adverse effect of antihyperlipidemic and antiarteriosclerotic drugs, initial encounter: Secondary | ICD-10-CM

## 2024-05-14 DIAGNOSIS — E538 Deficiency of other specified B group vitamins: Secondary | ICD-10-CM

## 2024-05-14 DIAGNOSIS — E785 Hyperlipidemia, unspecified: Secondary | ICD-10-CM | POA: Diagnosis not present

## 2024-05-14 DIAGNOSIS — E66811 Obesity, class 1: Secondary | ICD-10-CM | POA: Insufficient documentation

## 2024-05-14 DIAGNOSIS — J3089 Other allergic rhinitis: Secondary | ICD-10-CM

## 2024-05-14 DIAGNOSIS — R0683 Snoring: Secondary | ICD-10-CM

## 2024-05-14 DIAGNOSIS — B001 Herpesviral vesicular dermatitis: Secondary | ICD-10-CM

## 2024-05-14 DIAGNOSIS — J45909 Unspecified asthma, uncomplicated: Secondary | ICD-10-CM | POA: Insufficient documentation

## 2024-05-14 DIAGNOSIS — G720 Drug-induced myopathy: Secondary | ICD-10-CM

## 2024-05-14 DIAGNOSIS — H8111 Benign paroxysmal vertigo, right ear: Secondary | ICD-10-CM

## 2024-05-14 DIAGNOSIS — J4520 Mild intermittent asthma, uncomplicated: Secondary | ICD-10-CM

## 2024-05-14 DIAGNOSIS — R739 Hyperglycemia, unspecified: Secondary | ICD-10-CM | POA: Diagnosis not present

## 2024-05-14 DIAGNOSIS — F341 Dysthymic disorder: Secondary | ICD-10-CM

## 2024-05-14 DIAGNOSIS — K219 Gastro-esophageal reflux disease without esophagitis: Secondary | ICD-10-CM

## 2024-05-14 DIAGNOSIS — E559 Vitamin D deficiency, unspecified: Secondary | ICD-10-CM | POA: Diagnosis not present

## 2024-05-14 MED ORDER — ALBUTEROL SULFATE HFA 108 (90 BASE) MCG/ACT IN AERS: 2.0000 | INHALATION_SPRAY | Freq: Four times a day (QID) | RESPIRATORY_TRACT | 0 refills | Status: AC | PRN

## 2024-05-14 MED ORDER — LEVOCETIRIZINE DIHYDROCHLORIDE 5 MG PO TABS: 5.0000 mg | ORAL_TABLET | Freq: Every evening | ORAL | 1 refills | Status: AC

## 2024-05-14 MED ORDER — EZETIMIBE 10 MG PO TABS: 10.0000 mg | ORAL_TABLET | Freq: Every day | ORAL | 1 refills | Status: AC

## 2024-05-14 MED ORDER — OMEPRAZOLE 40 MG PO CPDR
40.0000 mg | DELAYED_RELEASE_CAPSULE | Freq: Every day | ORAL | 1 refills | Status: AC
Start: 2024-05-14 — End: ?

## 2024-05-14 MED ORDER — MONTELUKAST SODIUM 10 MG PO TABS: 10.0000 mg | ORAL_TABLET | Freq: Every day | ORAL | 1 refills | Status: AC

## 2024-05-14 MED ORDER — VALACYCLOVIR HCL 1 G PO TABS: 1000.0000 mg | ORAL_TABLET | Freq: Two times a day (BID) | ORAL | 0 refills | Status: AC | PRN

## 2024-05-14 MED ORDER — VITAMIN D (ERGOCALCIFEROL) 1.25 MG (50000 UNIT) PO CAPS: 50000.0000 [IU] | ORAL_CAPSULE | ORAL | 1 refills | Status: AC

## 2024-05-14 MED ORDER — AZELASTINE HCL 0.1 % NA SOLN: 1.0000 | Freq: Two times a day (BID) | NASAL | 1 refills | Status: AC

## 2024-05-14 NOTE — Progress Notes (Signed)
 Name: Sara Randolph   MRN: [id number]    DOB: 07-14-1961   Date:05/14/2024       Progress Note  Subjective  Chief Complaint  Chief Complaint  Patient presents with   Medical Management of Chronic Issues   Discussed the use of AI scribe software for clinical note transcription with the patient, who gave verbal consent to proceed.  History of Present Illness Sara Randolph is a 63 year old female who presents for a six-month follow-up visit.  She recently received a cortisone injection for trigger finger in her left ( 4th finger )  hand on May 05, 2024, opting for the injection over surgery.  She has high cholesterol and cannot take statins due to muscle cramps. She was prescribed ezetimibe  but has not been taking it since the end of September due to issues with her prescription being filled at the correct pharmacy.  She has vitamin D  and B12 deficiencies. She takes a prescription vitamin D   and a 500 mcg sublingual B12 supplement, though she forgets to take the B12 daily.   She experiences perennial allergic rhinitis, which worsens seasonally, particularly from February to June. She uses levocetirizine and montelukast  seasonally to manage symptoms. She also uses a nasal spray containing azelastine  and fluticasone  but prefers to avoid it due to epistaxis.  She experiences wheezing, particularly during allergy season, and uses albuterol  as needed. She also experiences heartburn and indigestion, which she attributes to recent weight gain, and manages these symptoms with omeprazole .  She has a history of benign paroxysmal positional vertigo, primarily affecting the right side. She does not take Meclizine as it causes grogginess and prefers to manage the condition with repositioning maneuvers.  She has a history of cold sores and uses Valtrex  as needed, especially when exposed to sunlight.  She reports snoring and experiences occasional daytime fatigue. She has  separated bedrooms with her partner due to her snoring.  She has gained four pounds in the past six months and is concerned about her weight. She has joined the [employer] but has not been consistent with exercise due to other commitments. She previously lost weight using Ozempic  but stopped taking it due to cost   Snoring, she has to sleep in a separate room from her husband due to loud snoring and wakes up at times gasping for air. She feels tired at times, but has improved with magnesium glycinate supplementation   She previously used an estradiol  patch but discontinued it due to increased hunger and weight gain. Her hot flashes have subsided, except when consuming high sugar or caffeine late in the day.    Patient Active Problem List   Diagnosis Date Noted   Trigger thumb of right hand 08/02/2023   Overweight 02/08/2022   Impingement syndrome of right shoulder region 11/06/2020   Statin myopathy 08/08/2020   Hypercholesteremia 08/08/2020   B12 deficiency 08/08/2020   Prediabetes 08/08/2020   Lumbar back pain with radiculopathy affecting right lower extremity 08/31/2018   Vitamin D  deficiency 02/24/2017   BPPV (benign paroxysmal positional vertigo), unspecified laterality 08/25/2016   Peripheral vertigo 01/13/2015   Dyslipidemia 01/13/2015   Cold sore 01/13/2015   Gastroesophageal reflux disease without esophagitis 01/13/2015   Perennial allergic rhinitis 01/13/2015   Menopausal symptom 01/13/2015   History of concussion 01/18/2013    Past Surgical History:  Procedure Laterality Date   ABDOMINAL HYSTERECTOMY  1988   BREAST BIOPSY Right 07/04/2020   stereo bx, x-clip, FATTY BREAST  TISSUE WITH LIMITED   CATARACT EXTRACTION W/PHACO Right 06/01/2023   Procedure: CATARACT EXTRACTION PHACO AND INTRAOCULAR LENS PLACEMENT (IOC) RIGHT 8.08 00:54.0;  Surgeon: Mittie Gaskin, MD;  Location[facility];  Service: Ophthalmology;  Laterality: Right;    CATARACT EXTRACTION W/PHACO Left 06/15/2023   Procedure: CATARACT EXTRACTION PHACO AND INTRAOCULAR LENS PLACEMENT (IOC) LEFT 7.41 00:32.9;  Surgeon: Mittie Gaskin, MD;  Location: [facility];  Service: Ophthalmology;  Laterality: Left;   CHOLECYSTECTOMY     COSMETIC SURGERY  1990   Nose - lip   DILATION AND CURETTAGE OF UTERUS     ECTOPIC PREGNANCY SURGERY     RHINOPLASTY  1990   SALPINGECTOMY     thumb trigger finger release  Right 12/27/2023   [facility]   TONSILLECTOMY  1969   WRIST SURGERY  1983    Family History  Problem Relation Age of Onset   Alzheimer's disease Mother    Arthritis Mother    COPD Mother    Parkinsonism Father    Diabetes Father    Vision loss Father    Depression Sister    Osteoporosis Sister    Anxiety disorder Sister    Obesity Sister    Heart disease Brother    Heart attack Brother    ADD / ADHD Brother    Breast cancer Other        niece    Social History   Tobacco Use   Smoking status: Never   Smokeless tobacco: Never  Substance Use Topics   Alcohol use: Yes    Alcohol/week: 0.0 standard drinks of alcohol    Comment: Consumes alcohol on Saturday     Current Outpatient Medications:    albuterol  (VENTOLIN  HFA) 108 (90 Base) MCG/ACT inhaler, Inhale 2 puffs into the lungs every 6 (six) hours as needed for wheezing or shortness of breath., Disp: 8 g, Rfl: 0   Azelastine -Fluticasone  137-50 MCG/ACT SUSP, Place 1 spray into the nose every 12 (twelve) hours., Disp: 23 g, Rfl: 2   Cyanocobalamin  (VITAMIN B-12) 5000 MCG SUBL, Place under the tongue daily., Disp: , Rfl:    ezetimibe  (ZETIA ) 10 MG tablet, TAKE 1 TABLET BY MOUTH EVERY DAY, Disp: 90 tablet, Rfl: 1   levocetirizine (XYZAL) 5 MG tablet, Take 5 mg by mouth every evening., Disp: , Rfl:    montelukast  (SINGULAIR ) 10 MG tablet, Take 10 mg by mouth at bedtime., Disp: , Rfl:    Multiple Vitamin (MULTIVITAMIN) tablet, Take 1 tablet by mouth daily., Disp: , Rfl:    omeprazole   (PRILOSEC) 40 MG capsule, Take 1 capsule (40 mg total) by mouth daily., Disp: 90 capsule, Rfl: 1   valACYclovir  (VALTREX ) 1000 MG tablet, Take 1 tablet (1,000 mg total) by mouth 2 (two) times daily as needed., Disp: 30 tablet, Rfl: 0   Vitamin D , Ergocalciferol , (DRISDOL ) 1.25 MG (50000 UNIT) CAPS capsule, Take 1 capsule (50,000 Units total) by mouth once a week., Disp: 12 capsule, Rfl: 1   meclizine (ANTIVERT) 25 MG tablet, Take 25 mg by mouth every 8 (eight) hours as needed. (Patient not taking: Reported on 05/14/2024), Disp: , Rfl:   Allergies  Allergen Reactions   Crestor  [Rosuvastatin ]     Cause muscle cramps, dizziness, headache and overall fatigue.   Atorvastatin      Muscle ache   Pravastatin Itching    dizziness   Venlafaxine  Other (See Comments)    dizziness   Hydrocodone  Nausea Only    I personally reviewed active problem  list, medication list, allergies, family history with the patient/caregiver today.   ROS  Ten systems reviewed and is negative except as mentioned in HPI    Objective Physical Exam  CONSTITUTIONAL: Patient appears well-developed and well-nourished.  No distress. HEENT: Head atraumatic, normocephalic, neck supple. CARDIOVASCULAR: Normal rate, regular rhythm and normal heart sounds.  No murmur heard. No BLE edema. PULMONARY: Effort normal and breath sounds normal. No respiratory distress. ABDOMINAL: There is no tenderness or distention. MUSCULOSKELETAL: Normal gait. Without gross motor or sensory deficit. PSYCHIATRIC: Patient has a normal mood and affect. behavior is normal. Judgment and thought content normal.  Vitals:   05/14/24 0815  BP: 118/74  Pulse: 84  Resp: 16  SpO2: 97%  Weight: 166 lb 1.6 oz (75.3 kg)  Height: 5' 2 (1.575 m)    Body mass index is 30.38 kg/m.    PHQ2/9:    05/14/2024    8:08 AM 02/24/2024   10:11 AM 11/11/2023    8:23 AM 08/29/2023   12:57 PM 05/13/2023    8:36 AM  Depression screen PHQ 2/9  Decreased  Interest 0 0 0 0 0  Down, Depressed, Hopeless 0 0 0 0 0  PHQ - 2 Score 0 0 0 0 0  Altered sleeping  0 0 0 3  Tired, decreased energy  0 0 0 3  Change in appetite  0 0 0 0  Feeling bad or failure about yourself   0 0 0 0  Trouble concentrating  0 0 0 0  Moving slowly or fidgety/restless  0 0 0 0  Suicidal thoughts  0 0 0 0  PHQ-9 Score  0 0 0 6  Difficult doing work/chores   Not difficult at all Not difficult at all     phq 9 is negative  Fall Risk:    05/14/2024    8:08 AM 02/24/2024    9:59 AM 11/11/2023    8:23 AM 08/29/2023   12:57 PM 05/13/2023    8:36 AM  Fall Risk   Falls in the past year? 0 0 0 0 0  Number falls in past yr: 0 0 0 0 0  Injury with Fall? 0 0 0 0 0  Risk for fall due to : No Fall Risks No Fall Risks No Fall Risks No Fall Risks No Fall Risks  Follow up Falls evaluation completed Falls prevention discussed Falls prevention discussed;Education provided;Falls evaluation completed Falls prevention discussed;Education provided;Falls evaluation completed Falls prevention discussed      Assessment & Plan Obesity BMI 30, weight gain of 4 pounds in 6 months. Previous weight loss with Ozempic . Limited physical activity due to home projects. Plans to increase activity through [employer]. - Encourage regular [employer] visits to establish exercise habit. - Discuss  GLP-1 agonist  if sleep study indicates sleep apnea and insurance approves.  Suspected obstructive sleep apnea Suspected due to snoring and gasping. Sleep apnea questionnaire score of 7. Considering sleep study. - Submit sleep study request. - Consider referral to sleep specialist if study is positive.  Hyperlipidemia, statin intolerant Intolerant to statins due to muscle cramps. On ezetimibe , not taken since September due to prescription issues. - Resume ezetimibe , ensure correct pharmacy. - Order lipid panel after 6 weeks of ezetimibe .  Gastroesophageal reflux disease (GERD) Increased omeprazole   use due to weight gain and symptoms. - Continue omeprazole .  Perennial allergic rhinitis Year-round allergies, seasonal exacerbations. Fluticasone  causes epistaxis. - Prescribe Astelin  nasal spray. - Continue levocetirizine and montelukast  seasonally. -  Send prescriptions to correct pharmacy.  Mild intermittent asthma (reactive airway disease) Asthma symptoms exacerbated by allergies, especially in spring. - Continue montelukast  seasonally. - Send albuterol  prescription to correct pharmacy.  Benign paroxysmal positional vertigo, right side Vertigo on right side. Meclizine ineffective, causes grogginess. ENT consultation scheduled. - Proceed with ENT consultation.  Trigger finger, left hand, post-corticosteroid injection Received corticosteroid injection, opted over surgery.  Recurrent herpes labialis (cold sores) Cold sores with sun exposure. Has Valtrex  supply. - Send Valtrex  prescription to correct pharmacy.  Vitamin D  deficiency Taking prescription vitamin D . - Continue vitamin D  supplementation. - Order vitamin D  level test.  Vitamin B12 deficiency Taking 500 mcg sublingual B12 occasionally. - Continue sublingual B12. - Order B12 level test.  General Health Maintenance Discussed flu vaccination timing post-corticosteroid injection. - Advise flu vaccination two weeks post-injection.

## 2024-06-25 ENCOUNTER — Encounter: Payer: Self-pay | Admitting: Family Medicine

## 2024-06-27 ENCOUNTER — Telehealth: Payer: Self-pay

## 2024-06-27 NOTE — Telephone Encounter (Signed)
 Please review snap sleep study results and what next step is?

## 2024-07-03 ENCOUNTER — Encounter: Payer: Self-pay | Admitting: Dermatology

## 2024-07-03 ENCOUNTER — Ambulatory Visit: Payer: Federal, State, Local not specified - PPO | Admitting: Dermatology

## 2024-07-03 DIAGNOSIS — L578 Other skin changes due to chronic exposure to nonionizing radiation: Secondary | ICD-10-CM | POA: Diagnosis not present

## 2024-07-03 DIAGNOSIS — L738 Other specified follicular disorders: Secondary | ICD-10-CM

## 2024-07-03 DIAGNOSIS — L905 Scar conditions and fibrosis of skin: Secondary | ICD-10-CM

## 2024-07-03 DIAGNOSIS — D239 Other benign neoplasm of skin, unspecified: Secondary | ICD-10-CM

## 2024-07-03 DIAGNOSIS — D1801 Hemangioma of skin and subcutaneous tissue: Secondary | ICD-10-CM

## 2024-07-03 DIAGNOSIS — W908XXA Exposure to other nonionizing radiation, initial encounter: Secondary | ICD-10-CM | POA: Diagnosis not present

## 2024-07-03 DIAGNOSIS — Z7189 Other specified counseling: Secondary | ICD-10-CM

## 2024-07-03 DIAGNOSIS — Z1283 Encounter for screening for malignant neoplasm of skin: Secondary | ICD-10-CM

## 2024-07-03 DIAGNOSIS — D229 Melanocytic nevi, unspecified: Secondary | ICD-10-CM

## 2024-07-03 DIAGNOSIS — L814 Other melanin hyperpigmentation: Secondary | ICD-10-CM | POA: Diagnosis not present

## 2024-07-03 DIAGNOSIS — L219 Seborrheic dermatitis, unspecified: Secondary | ICD-10-CM

## 2024-07-03 DIAGNOSIS — L821 Other seborrheic keratosis: Secondary | ICD-10-CM

## 2024-07-03 DIAGNOSIS — D2371 Other benign neoplasm of skin of right lower limb, including hip: Secondary | ICD-10-CM

## 2024-07-03 NOTE — Patient Instructions (Addendum)
 Recommend OTC 1% hydrocortisone cream 1-2 times daily to affected area until itchy rash cleared.   Discussed cosmetic procedure cryotherapy for seborrheic keratoses on hands, noncovered.  $60 for 1st lesion and $15 for each additional lesion if done on the same day.  Maximum charge $350.  One touch-up treatment included no charge. Discussed risks of treatment including dyspigmentation, small scar, and/or recurrence. Recommend daily broad spectrum sunscreen SPF 30+/photoprotection to treated areas once healed.  Counseling for BBL / IPL / Laser and Coordination of Care Discussed the treatment option of Broad Band Light (BBL) /Intense Pulsed Light (IPL)/ Laser for skin discoloration, including brown spots and redness.  Typically we recommend at least 1-3 treatment sessions about 5-8 weeks apart for best results.  Cannot have tanned skin when BBL performed, and regular use of sunscreen/photoprotection is advised after the procedure to help maintain results. The patient's condition may also require maintenance treatments in the future.  The fee for BBL / laser treatments is $350 per treatment session for the whole face.  A fee can be quoted for other parts of the body.  Insurance typically does not pay for BBL/laser treatments and therefore the fee is an out-of-pocket cost. Recommend prophylactic valtrex  treatment. Once scheduled for procedure, will send Rx in prior to patient's appointment.    Recommend daily broad spectrum sunscreen SPF 30+ to sun-exposed areas, reapply every 2 hours as needed. Call for new or changing lesions.  Staying in the shade or wearing long sleeves, sun glasses (UVA+UVB protection) and wide brim hats (4-inch brim around the entire circumference of the hat) are also recommended for sun protection.      Melanoma ABCDEs  Melanoma is the most dangerous type of skin cancer, and is the leading cause of death from skin disease.  You are more likely to develop melanoma if you: Have  light-colored skin, light-colored eyes, or red or blond hair Spend a lot of time in the sun Tan regularly, either outdoors or in a tanning bed Have had blistering sunburns, especially during childhood Have a close family member who has had a melanoma Have atypical moles or large birthmarks  Early detection of melanoma is key since treatment is typically straightforward and cure rates are extremely high if we catch it early.   The first sign of melanoma is often a change in a mole or a new dark spot.  The ABCDE system is a way of remembering the signs of melanoma.  A for asymmetry:  The two halves do not match. B for border:  The edges of the growth are irregular. C for color:  A mixture of colors are present instead of an even brown color. D for diameter:  Melanomas are usually (but not always) greater than 6mm - the size of a pencil eraser. E for evolution:  The spot keeps changing in size, shape, and color.  Please check your skin once per month between visits. You can use a small mirror in front and a large mirror behind you to keep an eye on the back side or your body.   If you see any new or changing lesions before your next follow-up, please call to schedule a visit.  Please continue daily skin protection including broad spectrum sunscreen SPF 30+ to sun-exposed areas, reapplying every 2 hours as needed when you're outdoors.   Staying in the shade or wearing long sleeves, sun glasses (UVA+UVB protection) and wide brim hats (4-inch brim around the entire circumference of the hat) are  also recommended for sun protection.      Due to recent changes in healthcare laws, you may see results of your pathology and/or laboratory studies on MyChart before the doctors have had a chance to review them. We understand that in some cases there may be results that are confusing or concerning to you. Please understand that not all results are received at the same time and often the doctors may need  to interpret multiple results in order to provide you with the best plan of care or course of treatment. Therefore, we ask that you please give us  2 business days to thoroughly review all your results before contacting the office for clarification. Should we see a critical lab result, you will be contacted sooner.   If You Need Anything After Your Visit  If you have any questions or concerns for your doctor, please call our main line at [phone] and press option 4 to reach your doctor's medical assistant. If no one answers, please leave a voicemail as directed and we will return your call as soon as possible. Messages left after 4 pm will be answered the following business day.   You may also send us  a message via MyChart. We typically respond to MyChart messages within 1-2 business days.  For prescription refills, please ask your pharmacy to contact our office. Our fax number is [phone].  If you have an urgent issue when the clinic is closed that cannot wait until the next business day, you can page your doctor at the number below.    Please note that while we do our best to be available for urgent issues outside of office hours, we are not available 24/7.   If you have an urgent issue and are unable to reach us , you may choose to seek medical care at your doctor's office, retail clinic, urgent care center, or emergency room.  If you have a medical emergency, please immediately call 911 or go to the emergency department.  Pager Numbers  - Dr. Hester: [phone]  - Dr. Jackquline: [phone]  - Dr. Claudene: [phone]   - Dr. Raymund: [phone]  In the event of inclement weather, please call our main line at [phone] for an update on the status of any delays or closures.  Dermatology Medication Tips: Please keep the boxes that topical medications come in in order to help keep track of the instructions about where and how to use these. Pharmacies typically print the  medication instructions only on the boxes and not directly on the medication tubes.   If your medication is too expensive, please contact our office at [phone] option 4 or send us  a message through MyChart.   We are unable to tell what your co-pay for medications will be in advance as this is different depending on your insurance coverage. However, we may be able to find a substitute medication at lower cost or fill out paperwork to get insurance to cover a needed medication.   If a prior authorization is required to get your medication covered by your insurance company, please allow us  1-2 business days to complete this process.  Drug prices often vary depending on where the prescription is filled and some pharmacies may offer cheaper prices.  The website www.goodrx.com contains coupons for medications through different pharmacies. The prices here do not account for what the cost may be with help from insurance (it may be cheaper with your insurance), but the website can give you the price if you  did not use any insurance.  - You can print the associated coupon and take it with your prescription to the pharmacy.  - You may also stop by our office during regular business hours and pick up a GoodRx coupon card.  - If you need your prescription sent electronically to a different pharmacy, notify our office through [facility] or by phone at [phone] option 4.     Si Usted Necesita Algo Despus de Su Visita  Tambin puede enviarnos un mensaje a travs de Clinical Cytogeneticist. Por lo general respondemos a los mensajes de MyChart en el transcurso de 1 a 2 das hbiles.  Para renovar recetas, por favor pida a su farmacia que se ponga en contacto con nuestra oficina. Randi lakes de fax es [place] [phone].  Si tiene un asunto urgente cuando la clnica est cerrada y que no puede esperar hasta el siguiente da hbil, puede llamar/localizar a su doctor(a) al nmero que aparece a continuacin.    Por favor, tenga en cuenta que aunque hacemos todo lo posible para estar disponibles para asuntos urgentes fuera del horario de [place], no estamos disponibles las 24 horas del da, los 7 [address]  [address] de [place].   Si tiene un problema urgente y no puede comunicarse con nosotros, puede optar por buscar atencin mdica  en el consultorio de su doctor(a), en una clnica privada, en un centro de atencin urgente o en una sala de emergencias.  Si tiene engineer, drilling, por favor llame inmediatamente al 911 o vaya a la sala de emergencias.  Nmeros de bper  - Dr. Hester: [phone]  - Dra. Jackquline: [phone]  - Dr. Claudene: [phone]  - Dra. Kitts: [phone]  En caso de inclemencias del [place], por favor llame a nuestra lnea principal al [phone] para una actualizacin sobre el estado de cualquier retraso o cierre.  Consejos para la medicacin en dermatologa: Por favor, guarde las cajas en las que vienen los medicamentos de uso tpico para ayudarle a seguir las instrucciones sobre dnde y cmo usarlos. Las farmacias generalmente imprimen las instrucciones del medicamento slo en las cajas y no directamente en los tubos del [place].   Si su medicamento es muy caro, por favor, pngase en contacto con landry rieger llamando al [phone] y presione la opcin 4 o envenos un mensaje a travs de Clinical Cytogeneticist.   No podemos decirle cul ser su copago por los medicamentos por adelantado ya que esto es diferente dependiendo de la cobertura de su seguro. Sin embargo, es posible que podamos encontrar un medicamento sustituto a audiological scientist un formulario para que el seguro cubra el medicamento que se considera necesario.   Si se requiere una autorizacin previa para que su compaa de seguros cubra su medicamento, por favor permtanos de 1 a 2 das hbiles para completar este proceso.  Los precios de los medicamentos varan con frecuencia dependiendo del environmental consultant de dnde se  surte la receta y alguna farmacias pueden ofrecer precios ms baratos.  El sitio web www.goodrx.com tiene cupones para medicamentos de health and safety inspector. Los precios aqu no tienen en cuenta lo que podra costar con la ayuda del seguro (puede ser ms barato con su seguro), pero el sitio web puede darle el precio si no utiliz tourist information centre manager.  - Puede imprimir el cupn correspondiente y llevarlo con su receta a la farmacia.  - Tambin puede pasar por nuestra oficina durante el horario de atencin regular y education officer, museum una tarjeta de cupones de GoodRx.  - Si  necesita que su receta se enve electrnicamente a una farmacia diferente, informe a nuestra oficina a travs de MyChart de [place] o por telfono llamando al [phone] y presione la opcin 4.

## 2024-07-03 NOTE — Progress Notes (Signed)
 Follow-Up Visit   Subjective  Sara Randolph is a 63 y.o. female who presents for the following: Skin Cancer Screening and Full Body Skin Exam. No personal Hx of skin cancer or dysplastic nevi.   The patient presents for Total-Body Skin Exam (TBSE) for skin cancer screening and mole check. The patient has spots, moles and lesions to be evaluated, some may be new or changing and the patient may have concern these could be cancer.    The following portions of the chart were reviewed this encounter and updated as appropriate: medications, allergies, medical history  Review of Systems:  No other skin or systemic complaints except as noted in HPI or Assessment and Plan.  Objective  Well appearing patient in no apparent distress; mood and affect are within normal limits.  A full examination was performed including scalp, head, eyes, ears, nose, lips, neck, chest, axillae, abdomen, back, buttocks, bilateral upper extremities, bilateral lower extremities, hands, feet, fingers, toes, fingernails, and toenails. All findings within normal limits unless otherwise noted below.   Exam of nails limited by presence of nail polish.   Relevant physical exam findings are noted in the Assessment and Plan.    Assessment & Plan   SKIN CANCER SCREENING PERFORMED TODAY.  ACTINIC DAMAGE - Chronic condition, secondary to cumulative UV/sun exposure - diffuse scaly erythematous macules with underlying dyspigmentation - Recommend daily broad spectrum sunscreen SPF 30+ to sun-exposed areas, reapply every 2 hours as needed.  - Staying in the shade or wearing long sleeves, sun glasses (UVA+UVB protection) and wide brim hats (4-inch brim around the entire circumference of the hat) are also recommended for sun protection.  - Call for new or changing lesions.  LENTIGINES, SEBORRHEIC KERATOSES, HEMANGIOMAS - Benign normal skin lesions - Benign-appearing - Call for any changes  MELANOCYTIC NEVI - Tan-brown  and/or pink-flesh-colored symmetric macules and papules - Benign appearing on exam today - Observation - Call clinic for new or changing moles - Recommend daily use of broad spectrum spf 30+ sunscreen to sun-exposed areas.  - Check nails when remove polish.   Sebaceous Hyperplasia - Small yellow papules with a central dell at face - Benign-appearing - Observe. Call for changes.    DERMATOFIBROMA Right medial ankle  Exam: Firm pink/brown papulenodule with dimple sign. Treatment Plan: A dermatofibroma is a benign growth possibly related to trauma, such as an insect bite, cut from shaving, or inflamed acne-type bump.  Treatment options to remove include shave or excision with resulting scar and risk of recurrence.  Since benign-appearing and not bothersome, will observe for now.    SCAR, secondary to fall in bathroom in 2014. Exam: Dyspigmented smooth macule or patch at right lower lip. Benign-appearing.  Observation.  Call clinic for new or changing lesions. Recommend daily broad spectrum sunscreen SPF 30+, reapply every 2 hours as needed. Treatment: Recommend Serica moisturizing scar formula cream every night or [employer] brand or Mederma silicone scar sheet every night for the first year after a scar appears to help with scar remodeling if desired. Scars remodel on their own for a full year and will gradually improve in appearance over time.  SEBORRHEIC DERMATITIS Exam: Pink patches with greasy scale B/L postauricular crease  Chronic and persistent condition with duration or expected duration over one year. Condition is bothersome/symptomatic for patient. Currently flared.   Seborrheic Dermatitis is a chronic persistent rash characterized by pinkness and scaling most commonly of the mid face but also can occur on the scalp (dandruff), ears;  mid chest, mid back and groin.  It tends to be exacerbated by stress and cooler weather.  People who have neurologic disease may experience new  onset or exacerbation of existing seborrheic dermatitis.  The condition is not curable but treatable and can be controlled.  Treatment Plan:   Recommend OTC 1% hydrocortisone cream 1-2 times daily to affected area until itchy rash cleared.    SEBORRHEIC KERATOSIS - Stuck-on, waxy, tan-brown papules and/or plaques at dorsal hands. - Benign-appearing - Discussed benign etiology and prognosis. - Observe - Call for any changes -Patient will schedule appointment for Tx  Discussed cosmetic procedure cryotherapy for seborrheic keratoses, noncovered.  $60 for 1st lesion and $15 for each additional lesion if done on the same day.  Maximum charge $350.  One touch-up treatment included no charge. Discussed risks of treatment including dyspigmentation, small scar, and/or recurrence. Recommend daily broad spectrum sunscreen SPF 30+/photoprotection to treated areas once healed.  LENTIGINES Exam: scattered tan macules face Due to sun exposure Treatment Plan: Benign-appearing, observe. Recommend daily broad spectrum sunscreen SPF 30+ to sun-exposed areas, reapply every 2 hours as needed.  Call for any changes  Counseling for BBL / IPL / Laser and Coordination of Care Discussed the treatment option of Broad Band Light (BBL) /Intense Pulsed Light (IPL)/ Laser for skin discoloration, including brown spots and redness.  Typically we recommend at least 1-3 treatment sessions about 5-8 weeks apart for best results.  Cannot have tanned skin when BBL performed, and regular use of sunscreen/photoprotection is advised after the procedure to help maintain results. The patient's condition may also require maintenance treatments in the future.  The fee for BBL / laser treatments is $350 per treatment session for the whole face.  A fee can be quoted for other parts of the body.  Insurance typically does not pay for BBL/laser treatments and therefore the fee is an out-of-pocket cost. Recommend prophylactic valtrex   treatment. Once scheduled for procedure, will send Rx in prior to patient's appointment.      Return in about 1 year (around 07/03/2025) for TBSE, SK Tx next available (hands).  I, Jill Parcell, CMA, am acting as scribe for Rexene Rattler, MD.   Documentation: I have reviewed the above documentation for accuracy and completeness, and I agree with the above.  Rexene Rattler, MD

## 2024-07-18 ENCOUNTER — Telehealth: Admitting: Physician Assistant

## 2024-07-18 DIAGNOSIS — J019 Acute sinusitis, unspecified: Secondary | ICD-10-CM

## 2024-07-18 DIAGNOSIS — B9689 Other specified bacterial agents as the cause of diseases classified elsewhere: Secondary | ICD-10-CM

## 2024-07-18 MED ORDER — AMOXICILLIN-POT CLAVULANATE 875-125 MG PO TABS: 1.0000 | ORAL_TABLET | Freq: Two times a day (BID) | ORAL | 0 refills | Status: DC

## 2024-07-18 NOTE — Progress Notes (Signed)

## 2024-07-19 ENCOUNTER — Ambulatory Visit: Admitting: Family Medicine

## 2024-07-21 LAB — CBC WITH DIFFERENTIAL/PLATELET
Absolute Lymphocytes: 1369 {cells}/uL (ref 850–3900)
Absolute Monocytes: 289 {cells}/uL (ref 200–950)
Basophils Absolute: 30 {cells}/uL (ref 0–200)
Basophils Relative: 0.5 %
Eosinophils Absolute: 71 {cells}/uL (ref 15–500)
Eosinophils Relative: 1.2 %
HCT: 41.4 % (ref 35.9–46.0)
Hemoglobin: 13.4 g/dL (ref 11.7–15.5)
MCH: 28.4 pg (ref 27.0–33.0)
MCHC: 32.4 g/dL (ref 31.6–35.4)
MCV: 87.7 fL (ref 81.4–101.7)
MPV: 10.3 fL (ref 7.5–12.5)
Monocytes Relative: 4.9 %
Neutro Abs: 4142 {cells}/uL (ref 1500–7800)
Neutrophils Relative %: 70.2 %
Platelets: 311 Thousand/uL (ref 140–400)
RBC: 4.72 Million/uL (ref 3.80–5.10)
RDW: 12.6 % (ref 11.0–15.0)
Total Lymphocyte: 23.2 %
WBC: 5.9 Thousand/uL (ref 3.8–10.8)

## 2024-07-21 LAB — COMPREHENSIVE METABOLIC PANEL WITH GFR
AG Ratio: 1.8 (calc) (ref 1.0–2.5)
ALT: 19 U/L (ref 6–29)
AST: 15 U/L (ref 10–35)
Albumin: 4.2 g/dL (ref 3.6–5.1)
Alkaline phosphatase (APISO): 57 U/L (ref 37–153)
BUN: 16 mg/dL (ref 7–25)
CO2: 26 mmol/L (ref 20–32)
Calcium: 9.2 mg/dL (ref 8.6–10.4)
Chloride: 103 mmol/L (ref 98–110)
Creat: 0.69 mg/dL (ref 0.50–1.05)
Globulin: 2.4 g/dL (ref 1.9–3.7)
Glucose, Bld: 101 mg/dL — ABNORMAL HIGH (ref 65–99)
Potassium: 4.4 mmol/L (ref 3.5–5.3)
Sodium: 137 mmol/L (ref 135–146)
Total Bilirubin: 0.5 mg/dL (ref 0.2–1.2)
Total Protein: 6.6 g/dL (ref 6.1–8.1)
eGFR: 96 mL/min/1.73m2

## 2024-07-21 LAB — LIPID PANEL
Cholesterol: 222 mg/dL — ABNORMAL HIGH
HDL: 45 mg/dL — ABNORMAL LOW
LDL Cholesterol (Calc): 143 mg/dL — ABNORMAL HIGH
Non-HDL Cholesterol (Calc): 177 mg/dL — ABNORMAL HIGH
Total CHOL/HDL Ratio: 4.9 (calc)
Triglycerides: 199 mg/dL — ABNORMAL HIGH

## 2024-07-21 LAB — HEMOGLOBIN A1C
Hgb A1c MFr Bld: 5.8 % — ABNORMAL HIGH
Mean Plasma Glucose: 120 mg/dL
eAG (mmol/L): 6.6 mmol/L

## 2024-07-21 LAB — B12 AND FOLATE PANEL
Folate: 11.8 ng/mL
Vitamin B-12: 1027 pg/mL (ref 200–1100)

## 2024-07-21 LAB — VITAMIN D 25 HYDROXY (VIT D DEFICIENCY, FRACTURES): Vit D, 25-Hydroxy: 45 ng/mL (ref 30–100)

## 2024-07-23 ENCOUNTER — Ambulatory Visit: Payer: Self-pay | Admitting: Family Medicine

## 2024-08-22 ENCOUNTER — Encounter: Payer: Self-pay | Admitting: Family Medicine

## 2024-08-22 ENCOUNTER — Ambulatory Visit (INDEPENDENT_AMBULATORY_CARE_PROVIDER_SITE_OTHER): Admitting: Family Medicine

## 2024-08-22 VITALS — BP 116/74 | HR 72 | Resp 16 | Ht 62.0 in | Wt 167.9 lb

## 2024-08-22 DIAGNOSIS — E66811 Obesity, class 1: Secondary | ICD-10-CM | POA: Diagnosis not present

## 2024-08-22 DIAGNOSIS — G4733 Obstructive sleep apnea (adult) (pediatric): Secondary | ICD-10-CM | POA: Diagnosis not present

## 2024-08-22 MED ORDER — ZEPBOUND 2.5 MG/0.5ML ~~LOC~~ SOAJ: 2.5000 mg | SUBCUTANEOUS | 0 refills | Status: DC

## 2024-08-22 NOTE — Progress Notes (Signed)
 Name: Sara Randolph   MRN: [id number]    DOB: 1960/10/06   Date:08/22/2024       Progress Note  Subjective  Chief Complaint  Chief Complaint  Patient presents with   Medical Management of Chronic Issues    Pt needs adjustment with mask for CPAP   Discussed the use of AI scribe software for clinical note transcription with the patient, who gave verbal consent to proceed.  History of Present Illness Sara Randolph Jenna is a 64 year old female with obstructive sleep apnea who presents with issues tolerating CPAP therapy.  She started using a CPAP machine at the end of November to manage her obstructive sleep apnea. She experiences difficulty tolerating the CPAP due to the pressure being too high, causing discomfort and dryness in her mouth and throat. Initially, she used a nasal mask but found it intolerable, describing the sensation as 'like a fish.'  She switched to a different mask that is more comfortable in terms of material but faces issues with fit. The mask leaks unless it is extremely tight, which causes discomfort and pain on her head and nose. The top of the headgear is too large for her head, causing the mask to not fit properly and leading to air leaks. She has been unable to wear the mask for more than a couple of hours at a time, removing it after about two hours due to discomfort.  She recalls that her sleep study showed some oxygen desaturation during apneic events. Her CPAP machine is set to auto-titration mode, adjusting pressure as needed, but she has had issues with the machine being on airplane mode, which she believes may have been accidental.  She is concerned about her weight, which she manages through intermittent fasting, eating between noon and 6 PM. She has maintained her weight through the holidays and is awaiting the arrival of an under-desk treadmill to increase her physical activity. She consumes sugar-free coffee but was informed that her creamer  contains calories, affecting her fasting regimen.    Patient Active Problem List   Diagnosis Date Noted   OSA on CPAP 08/22/2024   Reactive airway disease 05/14/2024   Dysthymia 05/14/2024   Hyperglycemia 05/14/2024   Obesity (BMI 30.0-34.9) 05/14/2024   Trigger thumb of right hand 08/02/2023   Overweight 02/08/2022   Impingement syndrome of right shoulder region 11/06/2020   Statin myopathy 08/08/2020   Hypercholesteremia 08/08/2020   B12 deficiency 08/08/2020   Prediabetes 08/08/2020   Lumbar back pain with radiculopathy affecting right lower extremity 08/31/2018   Vitamin D  deficiency 02/24/2017   BPPV (benign paroxysmal positional vertigo), right 08/25/2016   Peripheral vertigo 01/13/2015   Dyslipidemia 01/13/2015   Cold sore 01/13/2015   Gastroesophageal reflux disease without esophagitis 01/13/2015   Perennial allergic rhinitis with seasonal variation 01/13/2015   Menopausal symptom 01/13/2015   History of concussion 01/18/2013    Past Surgical History:  Procedure Laterality Date   ABDOMINAL HYSTERECTOMY  1988   BREAST BIOPSY Right 07/04/2020   stereo bx, x-clip, FATTY BREAST TISSUE WITH LIMITED   CATARACT EXTRACTION W/PHACO Right 06/01/2023   Procedure: CATARACT EXTRACTION PHACO AND INTRAOCULAR LENS PLACEMENT (IOC) RIGHT 8.08 00:54.0;  Surgeon: Mittie Gaskin, MD;  Location: [facility];  Service: Ophthalmology;  Laterality: Right;   CATARACT EXTRACTION W/PHACO Left 06/15/2023   Procedure: CATARACT EXTRACTION PHACO AND INTRAOCULAR LENS PLACEMENT (IOC) LEFT 7.41 00:32.9;  Surgeon: Mittie Gaskin, MD;  Location: [facility];  Service: Ophthalmology;  Laterality:  Left;   CHOLECYSTECTOMY     COSMETIC SURGERY  1990   Nose - lip   DILATION AND CURETTAGE OF UTERUS     ECTOPIC PREGNANCY SURGERY     RHINOPLASTY  1990   SALPINGECTOMY     thumb trigger finger release  Right 12/27/2023   [facility]   TONSILLECTOMY  1969   WRIST SURGERY  1983     Family History  Problem Relation Age of Onset   Alzheimer's disease Mother    Arthritis Mother    COPD Mother    Parkinsonism Father    Diabetes Father    Vision loss Father    Depression Sister    Osteoporosis Sister    Anxiety disorder Sister    Obesity Sister    Heart disease Brother    Heart attack Brother    ADD / ADHD Brother    Breast cancer Other        niece    Social History   Tobacco Use   Smoking status: Never   Smokeless tobacco: Never  Substance Use Topics   Alcohol use: Yes    Alcohol/week: 0.0 standard drinks of alcohol    Comment: Consumes alcohol on Saturday    Current Medications[1]  Allergies[2]  I personally reviewed active problem list, medication list, allergies, family history with the patient/caregiver today.   ROS  Ten systems reviewed and is negative except as mentioned in HPI    Objective Physical Exam  CONSTITUTIONAL: Patient appears well-developed and well-nourished.  No distress. HEENT: Head atraumatic, normocephalic, neck supple. CARDIOVASCULAR: Normal rate, regular rhythm and normal heart sounds.  No murmur heard. No BLE edema. PULMONARY: Effort normal and breath sounds normal. No respiratory distress. ABDOMINAL: There is no tenderness or distention. MUSCULOSKELETAL: Normal gait. Without gross motor or sensory deficit. PSYCHIATRIC: Patient has a normal mood and affect. behavior is normal. Judgment and thought content normal.  Vitals:   08/22/24 0801  BP: 116/74  Pulse: 72  Resp: 16  SpO2: 96%  Weight: 167 lb 14.4 oz (76.2 kg)  Height: 5' 2 (1.575 m)    Body mass index is 30.71 kg/m.  Recent Results (from the past 2160 hours)  CBC with Differential/Platelet     Status: None   Collection Time: 07/20/24  8:25 AM  Result Value Ref Range   WBC 5.9 3.8 - 10.8 Thousand/uL   RBC 4.72 3.80 - 5.10 Million/uL   Hemoglobin 13.4 11.7 - 15.5 g/dL   HCT 58.5 64.0 - 53.9 %   MCV 87.7 81.4 - 101.7 fL   MCH 28.4 27.0 -  33.0 pg   MCHC 32.4 31.6 - 35.4 g/dL   RDW 87.3 88.9 - 84.9 %   Platelets 311 140 - 400 Thousand/uL   MPV 10.3 7.5 - 12.5 fL   Neutro Abs 4,142 1,500 - 7,800 cells/uL   Absolute Lymphocytes 1,369 850 - 3,900 cells/uL   Absolute Monocytes 289 200 - 950 cells/uL   Eosinophils Absolute 71 15 - 500 cells/uL   Basophils Absolute 30 0 - 200 cells/uL   Neutrophils Relative % 70.2 %   Total Lymphocyte 23.2 %   Monocytes Relative 4.9 %   Eosinophils Relative 1.2 %   Basophils Relative 0.5 %  Comprehensive metabolic panel with GFR     Status: Abnormal   Collection Time: 07/20/24  8:25 AM  Result Value Ref Range   Glucose, Bld 101 (H) 65 - 99 mg/dL    Comment: .  Fasting reference interval . For someone without known diabetes, a glucose value between 100 and 125 mg/dL is consistent with prediabetes and should be confirmed with a follow-up test. .    BUN 16 7 - 25 mg/dL   Creat 9.30 9.49 - 8.94 mg/dL   eGFR 96 > OR = 60 fO/fpw/8.26f7   BUN/Creatinine Ratio SEE NOTE: 6 - 22 (calc)    Comment:    Not Reported: BUN and Creatinine are within    reference range. .    Sodium 137 135 - 146 mmol/L   Potassium 4.4 3.5 - 5.3 mmol/L   Chloride 103 98 - 110 mmol/L   CO2 26 20 - 32 mmol/L   Calcium  9.2 8.6 - 10.4 mg/dL   Total Protein 6.6 6.1 - 8.1 g/dL   Albumin 4.2 3.6 - 5.1 g/dL   Globulin 2.4 1.9 - 3.7 g/dL (calc)   AG Ratio 1.8 1.0 - 2.5 (calc)   Total Bilirubin 0.5 0.2 - 1.2 mg/dL   Alkaline phosphatase (APISO) 57 37 - 153 U/L   AST 15 10 - 35 U/L   ALT 19 6 - 29 U/L  Lipid panel     Status: Abnormal   Collection Time: 07/20/24  8:25 AM  Result Value Ref Range   Cholesterol 222 (H) <200 mg/dL   HDL 45 (L) > OR = 50 mg/dL   Triglycerides 800 (H) <150 mg/dL   LDL Cholesterol (Calc) 143 (H) mg/dL (calc)    Comment: Reference range: <100 . Desirable range <100 mg/dL for primary prevention;   <70 mg/dL for patients with CHD or diabetic patients  with > or = 2 CHD risk  factors. SABRA LDL-C is now calculated using the Martin-Hopkins  calculation, which is a validated novel method providing  better accuracy than the Friedewald equation in the  estimation of LDL-C.  Gladis APPLETHWAITE et al. SANDREA. 7986;689(80): 2061-2068  (http://education.[facility].com/faq/FAQ164)    Total CHOL/HDL Ratio 4.9 <5.0 (calc)   Non-HDL Cholesterol (Calc) 177 (H) <130 mg/dL (calc)    Comment: For patients with diabetes plus 1 major ASCVD risk  factor, treating to a non-HDL-C goal of <100 mg/dL  (LDL-C of <29 mg/dL) is considered a therapeutic  option.   Hemoglobin A1c     Status: Abnormal   Collection Time: 07/20/24  8:25 AM  Result Value Ref Range   Hgb A1c MFr Bld 5.8 (H) <5.7 %    Comment: For someone without known diabetes, a hemoglobin  A1c value between 5.7% and 6.4% is consistent with prediabetes and should be confirmed with a  follow-up test. . For someone with known diabetes, a value <7% indicates that their diabetes is well controlled. A1c targets should be individualized based on duration of diabetes, age, comorbid conditions, and other considerations. . This assay result is consistent with an increased risk of diabetes. . Currently, no consensus exists regarding use of hemoglobin A1c for diagnosis of diabetes for children. .    Mean Plasma Glucose 120 mg/dL   eAG (mmol/L) 6.6 mmol/L  VITAMIN D  25 Hydroxy (Vit-D Deficiency, Fractures)     Status: None   Collection Time: 07/20/24  8:25 AM  Result Value Ref Range   Vit D, 25-Hydroxy 45 30 - 100 ng/mL    Comment: Vitamin D  Status         25-OH Vitamin D : . Deficiency:                    <20 ng/mL Insufficiency:  20 - 29 ng/mL Optimal:                 > or = 30 ng/mL . For 25-OH Vitamin D  testing on patients on  D2-supplementation and patients for whom quantitation  of D2 and D3 fractions is required, the [facility](TM) 25-OH VIT D, (D2,D3), LC/MS/MS is recommended: order  code 07111  (patients >41yrs). . See Note 1 . Note 1 . For additional information, please refer to  http://education.[facility].com/faq/FAQ199  (This link is being provided for informational/ educational purposes only.)   B12 and Folate Panel     Status: None   Collection Time: 07/20/24  8:25 AM  Result Value Ref Range   Vitamin B-12 1,027 200 - 1,100 pg/mL   Folate 11.8 ng/mL    Comment:                            Reference Range                            Low:           <3.4                            Borderline:    3.4-5.4                            Normal:        >5.4 .     PHQ2/9:    08/22/2024    8:01 AM 05/14/2024    8:08 AM 02/24/2024   10:11 AM 11/11/2023    8:23 AM 08/29/2023   12:57 PM  Depression screen PHQ 2/9  Decreased Interest 0 0 0 0 0  Down, Depressed, Hopeless 0 0 0 0 0  PHQ - 2 Score 0 0 0 0 0  Altered sleeping 0  0 0 0  Tired, decreased energy 0  0 0 0  Change in appetite 0  0 0 0  Feeling bad or failure about yourself  0  0 0 0  Trouble concentrating 0  0 0 0  Moving slowly or fidgety/restless 0  0 0 0  Suicidal thoughts 0  0 0 0  PHQ-9 Score 0  0  0  0   Difficult doing work/chores Not difficult at all   Not difficult at all Not difficult at all     Data saved with a previous flowsheet row definition    phq 9 is negative  Fall Risk:    08/22/2024    8:01 AM 05/14/2024    8:08 AM 02/24/2024    9:59 AM 11/11/2023    8:23 AM 08/29/2023   12:57 PM  Fall Risk   Falls in the past year? 0 0 0 0 0  Number falls in past yr: 0 0 0 0 0  Injury with Fall? 0 0  0  0  0   Risk for fall due to : No Fall Risks No Fall Risks No Fall Risks No Fall Risks No Fall Risks  Follow up Falls evaluation completed Falls evaluation completed Falls prevention discussed Falls prevention discussed;Education provided;Falls evaluation completed Falls prevention discussed;Education provided;Falls evaluation completed     Data saved with a previous flowsheet row definition     Assessment & Plan Obstructive sleep apnea Difficulty with CPAP due  to high pressure and mask fit issues, leading to non-compliance. Oxygen desaturation during apneic episodes necessitates effective CPAP therapy. Discussed importance of proper mask fit and pressure settings for compliance and hypoxia risk reduction. Explained adjustment period of 3-6 months for CPAP therapy benefits. - Return to CPAP provider to adjust mask fit and pressure settings. - Ensure CPAP use for at least four hours per night. - Educated on compliance importance to reduce hypoxia risk.  Obesity, class 1 Weight stabilized at 167 pounds. Engaging in intermittent fasting with some caloric intake during fasting. Discussed Wegovy  and Zepbound  for weight management, considering insurance coverage. Emphasized exercise and dietary modifications. - Attempt intermittent fasting with no caloric intake during fasting periods. - Purchase and use an under-desk treadmill for daily exercise. - Consider Zepbound  for weight loss if covered by insurance. - If Zepbound  not covered, consider oral Wegovy  orally at $150/month. - Educated on consistent exercise and dietary modifications.        [1]  Current Outpatient Medications:    albuterol  (VENTOLIN  HFA) 108 (90 Base) MCG/ACT inhaler, Inhale 2 puffs into the lungs every 6 (six) hours as needed for wheezing or shortness of breath., Disp: 8 g, Rfl: 0   azelastine  (ASTELIN ) 0.1 % nasal spray, Place 1 spray into both nostrils 2 (two) times daily. Use in each nostril as directed, Disp: 30 mL, Rfl: 1   ezetimibe  (ZETIA ) 10 MG tablet, Take 1 tablet (10 mg total) by mouth daily., Disp: 90 tablet, Rfl: 1   levocetirizine (XYZAL ) 5 MG tablet, Take 1 tablet (5 mg total) by mouth every evening., Disp: 90 tablet, Rfl: 1   Magnesium Glycinate 120 MG CAPS, Take 1 capsule by mouth at bedtime., Disp: , Rfl:    montelukast  (SINGULAIR ) 10 MG tablet, Take 1 tablet (10 mg total) by mouth at bedtime.,  Disp: 90 tablet, Rfl: 1   Multiple Vitamin (MULTIVITAMIN) tablet, Take 1 tablet by mouth daily., Disp: , Rfl:    omeprazole  (PRILOSEC) 40 MG capsule, Take 1 capsule (40 mg total) by mouth daily., Disp: 90 capsule, Rfl: 1   tirzepatide  (ZEPBOUND ) 2.5 MG/0.5ML Pen, Inject 2.5 mg into the skin once a week., Disp: 2 mL, Rfl: 0   valACYclovir  (VALTREX ) 1000 MG tablet, Take 1 tablet (1,000 mg total) by mouth 2 (two) times daily as needed., Disp: 30 tablet, Rfl: 0   Vitamin D , Ergocalciferol , (DRISDOL ) 1.25 MG (50000 UNIT) CAPS capsule, Take 1 capsule (50,000 Units total) by mouth once a week., Disp: 12 capsule, Rfl: 1   Cyanocobalamin  (B-12) 500 MCG SUBL, Place 1 tablet under the tongue daily., Disp: , Rfl:  [2]  Allergies Allergen Reactions   Crestor  [Rosuvastatin ]     Cause muscle cramps, dizziness, headache and overall fatigue.   Atorvastatin      Muscle ache   Pravastatin Itching    dizziness   Venlafaxine  Other (See Comments)    dizziness   Hydrocodone  Nausea Only

## 2024-08-27 ENCOUNTER — Encounter: Payer: Self-pay | Admitting: Family Medicine

## 2024-08-27 DIAGNOSIS — E66811 Obesity, class 1: Secondary | ICD-10-CM

## 2024-08-27 DIAGNOSIS — E785 Hyperlipidemia, unspecified: Secondary | ICD-10-CM

## 2024-08-27 DIAGNOSIS — R739 Hyperglycemia, unspecified: Secondary | ICD-10-CM

## 2024-08-28 ENCOUNTER — Encounter: Payer: Self-pay | Admitting: Family Medicine

## 2024-08-28 ENCOUNTER — Other Ambulatory Visit (HOSPITAL_COMMUNITY): Payer: Self-pay

## 2024-08-28 ENCOUNTER — Other Ambulatory Visit: Payer: Self-pay | Admitting: Family Medicine

## 2024-08-28 MED ORDER — WEGOVY 1.5 MG PO TABS: 1.5000 mg | ORAL_TABLET | Freq: Every day | ORAL | 0 refills | Status: AC

## 2024-08-30 ENCOUNTER — Other Ambulatory Visit (HOSPITAL_COMMUNITY): Payer: Self-pay

## 2024-08-31 ENCOUNTER — Other Ambulatory Visit (HOSPITAL_COMMUNITY): Payer: Self-pay

## 2024-09-03 ENCOUNTER — Other Ambulatory Visit (HOSPITAL_COMMUNITY): Payer: Self-pay

## 2024-09-07 ENCOUNTER — Other Ambulatory Visit: Payer: Self-pay | Admitting: Family Medicine

## 2024-09-07 DIAGNOSIS — E785 Hyperlipidemia, unspecified: Secondary | ICD-10-CM

## 2024-09-07 DIAGNOSIS — R739 Hyperglycemia, unspecified: Secondary | ICD-10-CM

## 2024-09-07 DIAGNOSIS — R7303 Prediabetes: Secondary | ICD-10-CM

## 2024-09-07 DIAGNOSIS — E66811 Obesity, class 1: Secondary | ICD-10-CM

## 2024-09-07 NOTE — Telephone Encounter (Signed)
 Copied from CRM #[id number]. Topic: Referral - Question >> Sep 07, 2024 10:58 AM Myrick T wrote: Reason for CRM: Precious from [facility] called stating they need a diagnosis code of pre-diabetes. Precious request a call back from Ami Gavel.

## 2024-11-12 ENCOUNTER — Ambulatory Visit: Admitting: Family Medicine

## 2024-11-20 ENCOUNTER — Ambulatory Visit: Admitting: Family Medicine

## 2025-07-16 ENCOUNTER — Encounter: Admitting: Dermatology
